# Patient Record
Sex: Male | Born: 1960 | Race: Black or African American | Hispanic: No | Marital: Single | Smoking: Former smoker
Health system: Southern US, Community
[De-identification: ages and names within clinical notes are randomized; demographics above are authoritative.]

## PROBLEM LIST (undated history)

## (undated) DIAGNOSIS — R413 Other amnesia: Secondary | ICD-10-CM

## (undated) DIAGNOSIS — N400 Enlarged prostate without lower urinary tract symptoms: Secondary | ICD-10-CM

## (undated) DIAGNOSIS — G959 Disease of spinal cord, unspecified: Secondary | ICD-10-CM

## (undated) DIAGNOSIS — I10 Essential (primary) hypertension: Secondary | ICD-10-CM

## (undated) DIAGNOSIS — G2581 Restless legs syndrome: Secondary | ICD-10-CM

## (undated) DIAGNOSIS — N529 Male erectile dysfunction, unspecified: Secondary | ICD-10-CM

## (undated) DIAGNOSIS — N401 Enlarged prostate with lower urinary tract symptoms: Secondary | ICD-10-CM

## (undated) DIAGNOSIS — Z9109 Other allergy status, other than to drugs and biological substances: Secondary | ICD-10-CM

## (undated) DIAGNOSIS — A159 Respiratory tuberculosis unspecified: Secondary | ICD-10-CM

## (undated) DIAGNOSIS — Z206 Contact with and (suspected) exposure to human immunodeficiency virus [HIV]: Secondary | ICD-10-CM

## (undated) DIAGNOSIS — J3089 Other allergic rhinitis: Secondary | ICD-10-CM

## (undated) DIAGNOSIS — R319 Hematuria, unspecified: Secondary | ICD-10-CM

## (undated) DIAGNOSIS — I2089 Other forms of angina pectoris: Secondary | ICD-10-CM

## (undated) DIAGNOSIS — N329 Bladder disorder, unspecified: Secondary | ICD-10-CM

## (undated) DIAGNOSIS — K219 Gastro-esophageal reflux disease without esophagitis: Secondary | ICD-10-CM

## (undated) DIAGNOSIS — Z87898 Personal history of other specified conditions: Secondary | ICD-10-CM

## (undated) DIAGNOSIS — J302 Other seasonal allergic rhinitis: Secondary | ICD-10-CM

## (undated) DIAGNOSIS — E785 Hyperlipidemia, unspecified: Secondary | ICD-10-CM

## (undated) DIAGNOSIS — J45909 Unspecified asthma, uncomplicated: Secondary | ICD-10-CM

## (undated) DIAGNOSIS — Z87820 Personal history of traumatic brain injury: Secondary | ICD-10-CM

## (undated) DIAGNOSIS — N138 Other obstructive and reflux uropathy: Secondary | ICD-10-CM

## (undated) HISTORY — DX: Other amnesia: R41.3

## (undated) HISTORY — DX: Contact with and (suspected) exposure to human immunodeficiency virus (hiv): Z20.6

## (undated) HISTORY — PX: BRAIN SURGERY: SHX531

## (undated) HISTORY — DX: Restless legs syndrome: G25.81

## (undated) HISTORY — DX: Unspecified asthma, uncomplicated: J45.909

## (undated) HISTORY — DX: Personal history of traumatic brain injury: Z87.820

## (undated) HISTORY — PX: CIRCUMCISION: SUR203

## (undated) HISTORY — PX: SMALL INTESTINE SURGERY: SHX150

## (undated) HISTORY — DX: Hematuria, unspecified: R31.9

## (undated) HISTORY — DX: Essential (primary) hypertension: I10

## (undated) HISTORY — PX: ABDOMINAL SURGERY: SHX537

## (undated) HISTORY — DX: Personal history of other specified conditions: Z87.898

## (undated) HISTORY — DX: Benign prostatic hyperplasia without lower urinary tract symptoms: N40.0

---
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
## 1980-08-12 HISTORY — PX: CIRCUMCISION: SUR203

## 2003-05-19 ENCOUNTER — Encounter: Admission: RE | Admit: 2003-05-19 | Discharge: 2003-05-19 | Payer: Self-pay | Admitting: Neurology

## 2003-05-19 ENCOUNTER — Encounter: Payer: Self-pay | Admitting: Neurology

## 2003-06-10 ENCOUNTER — Emergency Department (HOSPITAL_COMMUNITY): Admission: EM | Admit: 2003-06-10 | Discharge: 2003-06-10 | Payer: Self-pay | Admitting: Emergency Medicine

## 2006-11-27 ENCOUNTER — Emergency Department (HOSPITAL_COMMUNITY): Admission: EM | Admit: 2006-11-27 | Discharge: 2006-11-27 | Payer: Self-pay | Admitting: Emergency Medicine

## 2006-11-27 IMAGING — CR DG CHEST 2V
2 series · 2 of 2 positions shown · non-contrast
Comparison: none

CLINICAL DATA: Chest and left arm pain.
 CHEST - 2 VIEW:

[w chest pa]
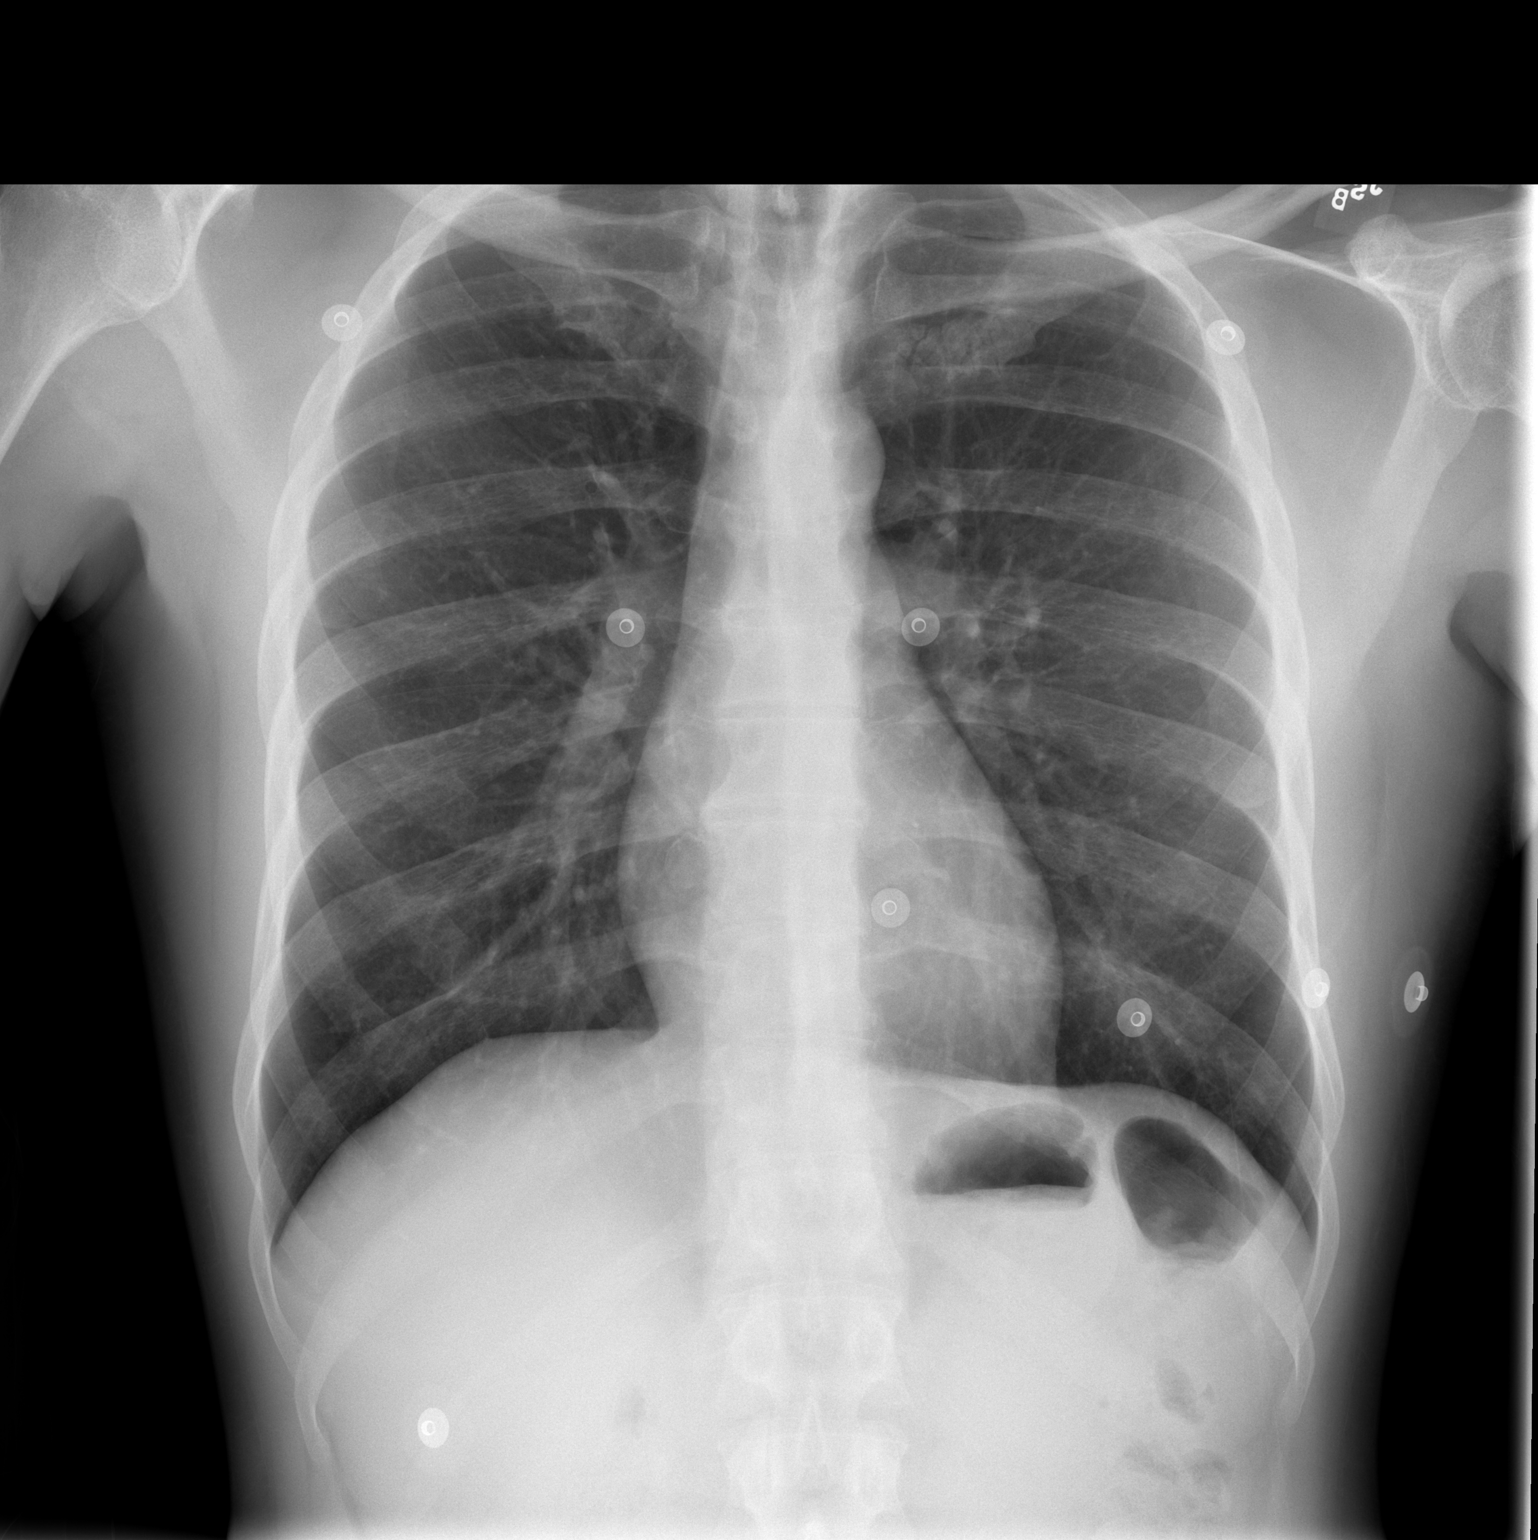

[w chest lat]
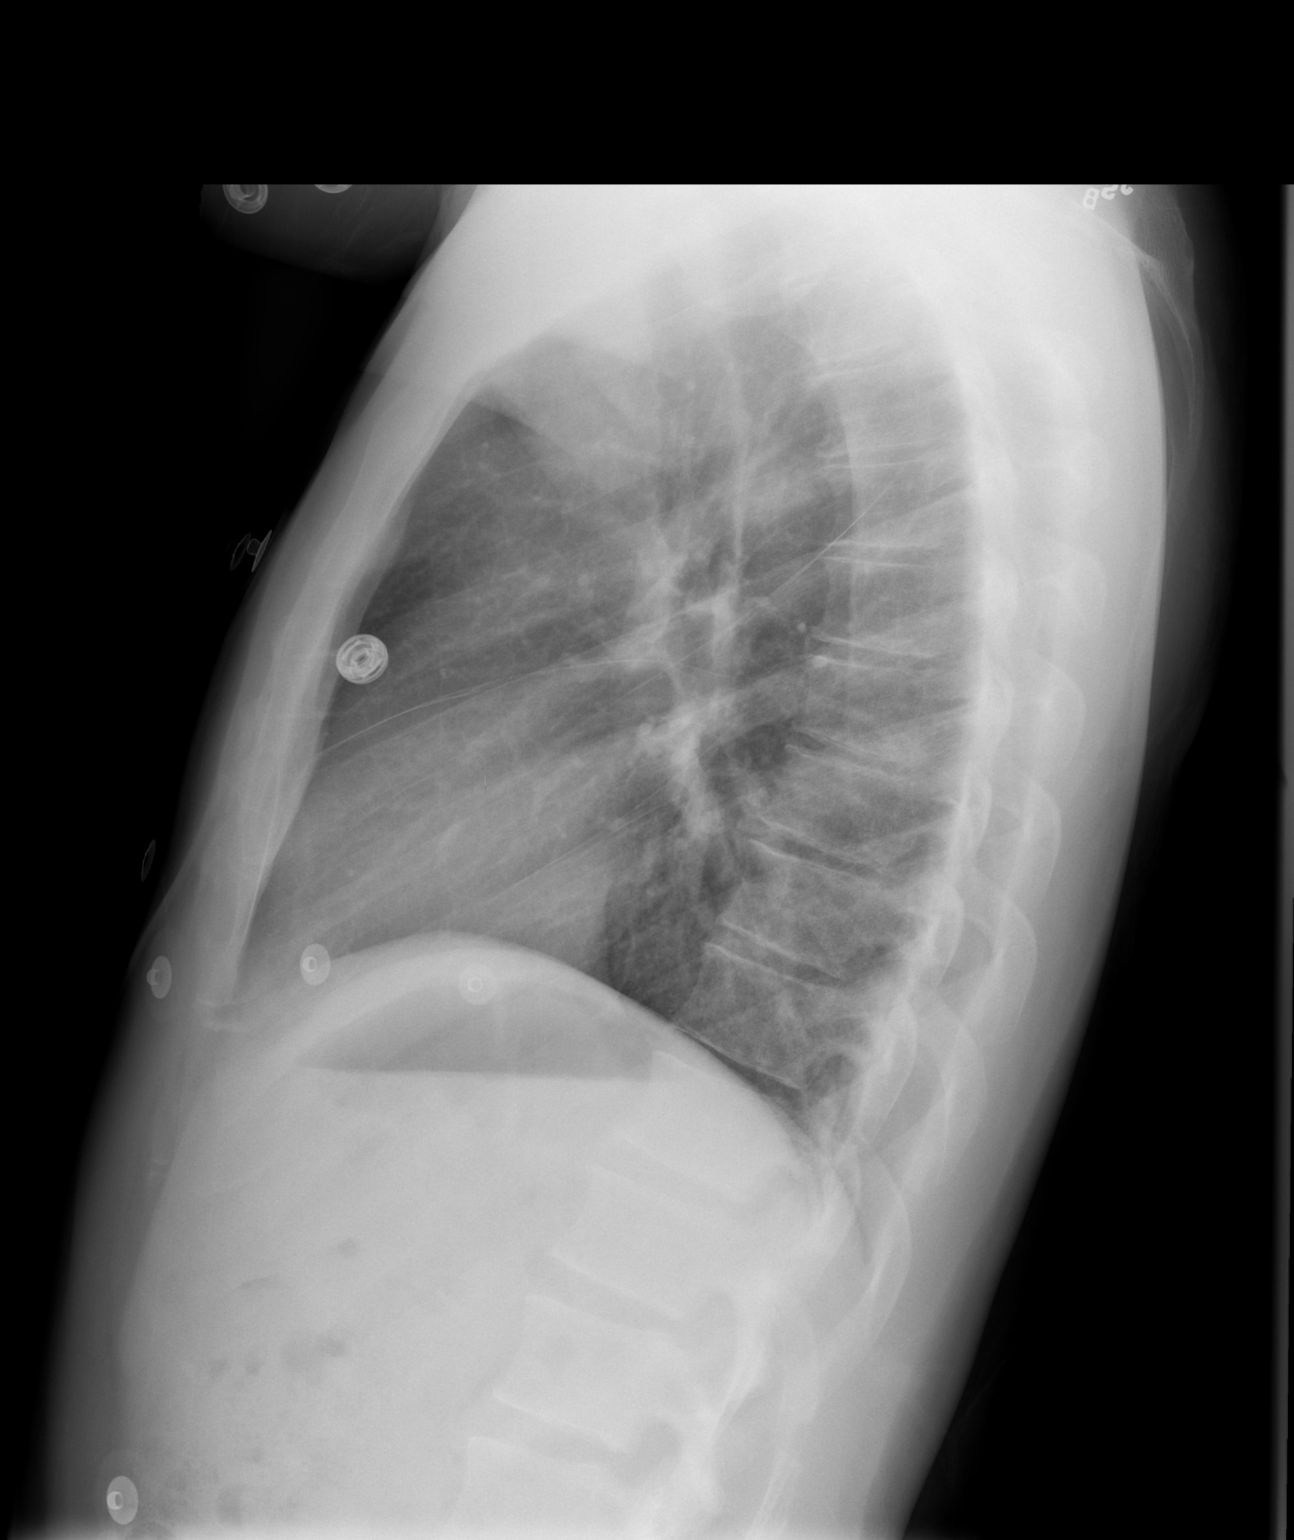

[2 of 2 positions shown; findings below may reference images not displayed]

FINDINGS: Lungs are clear.  Heart size is normal.  No effusion or focal bony abnormality.
IMPRESSION: No acute disease.

## 2008-06-13 ENCOUNTER — Ambulatory Visit: Payer: Self-pay | Admitting: Cardiovascular Disease

## 2009-04-02 ENCOUNTER — Emergency Department (HOSPITAL_COMMUNITY): Admission: EM | Admit: 2009-04-02 | Discharge: 2009-04-02 | Payer: Self-pay | Admitting: Family Medicine

## 2009-10-06 ENCOUNTER — Emergency Department (HOSPITAL_COMMUNITY): Admission: EM | Admit: 2009-10-06 | Discharge: 2009-10-06 | Payer: Self-pay | Admitting: Family Medicine

## 2010-03-06 ENCOUNTER — Emergency Department (HOSPITAL_COMMUNITY): Admission: EM | Admit: 2010-03-06 | Discharge: 2010-03-06 | Payer: Self-pay | Admitting: Family Medicine

## 2010-03-06 ENCOUNTER — Emergency Department (HOSPITAL_COMMUNITY): Admission: EM | Admit: 2010-03-06 | Discharge: 2010-03-06 | Payer: Self-pay | Admitting: Emergency Medicine

## 2010-10-15 ENCOUNTER — Inpatient Hospital Stay (INDEPENDENT_AMBULATORY_CARE_PROVIDER_SITE_OTHER)
Admission: RE | Admit: 2010-10-15 | Discharge: 2010-10-15 | Disposition: A | Discharge status: Home / Self Care | Payer: Medicare PPO | Source: Ambulatory Visit | Attending: Family Medicine | Admitting: Family Medicine

## 2010-10-15 DIAGNOSIS — M79609 Pain in unspecified limb: Secondary | ICD-10-CM

## 2010-10-27 LAB — POCT CARDIAC MARKERS
CKMB, poc: 1.1 ng/mL (ref 1.0–8.0)
CKMB, poc: <1 ng/mL — ABNORMAL LOW (ref 1.0–8.0)
Myoglobin, poc: 48.9 ng/mL (ref 12–200)
Myoglobin, poc: 52.9 ng/mL (ref 12–200)

## 2010-10-27 LAB — DIFFERENTIAL
Basophils Absolute: 0 10*3/uL (ref 0.0–0.1)
Eosinophils Absolute: 0.2 10*3/uL (ref 0.0–0.7)
Eosinophils Relative: 5 % (ref 0–5)
Lymphocytes Relative: 36 % (ref 12–46)

## 2010-10-27 LAB — BASIC METABOLIC PANEL
BUN: 11 mg/dL (ref 6–23)
CO2: 27 mEq/L (ref 19–32)
Calcium: 9.1 mg/dL (ref 8.4–10.5)
Chloride: 107 mEq/L (ref 96–112)
Creatinine, Ser: 1.12 mg/dL (ref 0.4–1.5)
GFR calc Af Amer: >60 mL/min (ref >60)
GFR calc non Af Amer: >60 mL/min (ref >60)
Glucose, Bld: 105 mg/dL — ABNORMAL HIGH (ref 70–99)
Potassium: 4.5 mEq/L (ref 3.5–5.1)
Sodium: 139 mEq/L (ref 135–145)

## 2010-10-27 LAB — HEPATIC FUNCTION PANEL
ALT: 12 U/L (ref 0–53)
Indirect Bilirubin: 0.6 mg/dL (ref 0.3–0.9)
Total Protein: 7 g/dL (ref 6.0–8.3)

## 2010-10-27 LAB — URINALYSIS, ROUTINE W REFLEX MICROSCOPIC
Bilirubin Urine: NEGATIVE
Hgb urine dipstick: NEGATIVE
Ketones, ur: NEGATIVE mg/dL
Protein, ur: NEGATIVE mg/dL
Urobilinogen, UA: 1 mg/dL (ref 0.0–1.0)

## 2010-10-27 LAB — LIPASE, BLOOD: Lipase: 40 U/L (ref 11–59)

## 2010-10-27 LAB — RAPID URINE DRUG SCREEN, HOSP PERFORMED
Amphetamines: NOT DETECTED
Tetrahydrocannabinol: NOT DETECTED

## 2010-10-27 LAB — CBC
MCV: 94.8 fL (ref 78.0–100.0)
Platelets: 213 10*3/uL (ref 150–400)
RDW: 13.5 % (ref 11.5–15.5)
WBC: 3.6 10*3/uL — ABNORMAL LOW (ref 4.0–10.5)

## 2010-10-31 LAB — DIFFERENTIAL
Basophils Absolute: 0 10*3/uL (ref 0.0–0.1)
Basophils Relative: 1 % (ref 0–1)
Eosinophils Absolute: 0.2 10*3/uL (ref 0.0–0.7)
Monocytes Absolute: 0.6 10*3/uL (ref 0.1–1.0)
Neutro Abs: 2.5 10*3/uL (ref 1.7–7.7)
Neutrophils Relative %: 44 % (ref 43–77)

## 2010-10-31 LAB — CBC
MCHC: 34 g/dL (ref 30.0–36.0)
MCV: 93.7 fL (ref 78.0–100.0)
RDW: 12.9 % (ref 11.5–15.5)

## 2010-10-31 LAB — MALARIA SMEAR
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
## 2011-07-24 ENCOUNTER — Encounter: Payer: Self-pay | Admitting: *Deleted

## 2011-07-24 ENCOUNTER — Emergency Department (INDEPENDENT_AMBULATORY_CARE_PROVIDER_SITE_OTHER)
Admission: EM | Admit: 2011-07-24 | Discharge: 2011-07-24 | Disposition: A | Discharge status: Home / Self Care | Payer: Medicare PPO | Source: Home / Self Care | Attending: Family Medicine | Admitting: Family Medicine

## 2011-07-24 DIAGNOSIS — J111 Influenza due to unidentified influenza virus with other respiratory manifestations: Secondary | ICD-10-CM

## 2011-07-24 DIAGNOSIS — R6889 Other general symptoms and signs: Secondary | ICD-10-CM

## 2011-07-24 HISTORY — DX: Bladder disorder, unspecified: N32.9

## 2011-07-24 HISTORY — DX: Other allergy status, other than to drugs and biological substances: Z91.09

## 2011-07-24 NOTE — ED Notes (Signed)
Pt  Reports     Fever  Body  Aches     And  Cough  With  Some dizzyness        Symptoms  X  4  Days            Somewhat  Dry  Cough

## 2011-07-24 NOTE — ED Provider Notes (Signed)
Travis Bryant is a 50 year old male who has had 3 days of sore throat, cough, congestion, fever, body aches. He tried NyQuil and DayQuil and some prescription cough medicine that was left over. It'll help a little bit but not very much. He denies any dyspnea he notes that he did not get a flu shot this year. He has positive sick contacts at work. He is eating and drinking okay but overall thinks he has the flu.  PMH reviewed.  ROS as above otherwise neg Medications reviewed. No current facility-administered medications for this encounter.   Current Outpatient Prescriptions  Medication Sig Dispense Refill  . solifenacin (VESICARE) 5 MG tablet Take 10 mg by mouth daily.        . Tamsulosin HCl (FLOMAX) 0.4 MG CAPS Take by mouth.          Exam:  BP 138/81  Pulse 93  Temp(Src) 98.5 F (36.9 C) (Oral)  Resp 20  SpO2 100% Gen: Well NAD HEENT: EOMI,  MMM, posterior pharyngeal erythema Lungs: CTABL Nl WOB Heart: RRR no MRG Abd: NABS, NT, ND Exts: Non edematous BL  LE, warm and well perfused.    Assessment and plan: 50 year old male with influenza-like illness. Plan Tylenol and ibuprofen for symptomatic management. Reviewed red flag signs or symptoms including dyspnea and vomiting with patient who expresses understanding handout and work note given.  Travis Bryant 07/24/11 1843

## 2011-11-09 ENCOUNTER — Emergency Department (HOSPITAL_COMMUNITY)
Admission: EM | Admit: 2011-11-09 | Discharge: 2011-11-09 | Disposition: A | Discharge status: Home / Self Care | Payer: Medicare PPO | Source: Home / Self Care | Attending: Emergency Medicine | Admitting: Emergency Medicine

## 2011-11-09 ENCOUNTER — Encounter (HOSPITAL_COMMUNITY): Payer: Self-pay | Admitting: *Deleted

## 2011-11-09 DIAGNOSIS — M5412 Radiculopathy, cervical region: Secondary | ICD-10-CM

## 2011-11-09 MED ORDER — MELOXICAM 7.5 MG PO TABS: 7.5000 mg | ORAL_TABLET | Freq: Every day | ORAL | Status: AC

## 2011-11-09 NOTE — Discharge Instructions (Signed)
Cervical Radiculopathy  Suspect most likely your current symptoms related to an irritated nerve take this medicine for 2 weeks if pain persists followup with her primary care Dr. Kathie Rhodes. discuss this further imaging will be necessary. We also discussed what symptoms should require for you to go to the emergency department for emergent imaging.    Cervical radiculopathy happens when a nerve in the neck is pinched or bruised by a slipped (herniated) disk or by arthritic changes in the bones of the cervical spine. This can occur due to an injury or as part of the normal aging process. Pressure on the cervical nerves can cause pain or numbness that runs from your neck all the way down into your arm and fingers. CAUSES  There are many possible causes, including:  Injury.   Muscle tightness in the neck from overuse.   Swollen, painful joints (arthritis).   Breakdown or degeneration in the bones and joints of the spine (spondylosis) due to aging.   Bone spurs that may develop near the cervical nerves.  SYMPTOMS  Symptoms include pain, weakness, or numbness in the affected arm and hand. Pain can be severe or irritating. Symptoms may be worse when extending or turning the neck. DIAGNOSIS  Your caregiver will ask about your symptoms and do a physical exam. He or she may test your strength and reflexes. X-rays, CT scans, and MRI scans may be needed in cases of injury or if the symptoms do not go away after a period of time. Electromyography (EMG) or nerve conduction testing may be done to study how your nerves and muscles are working. TREATMENT  Your caregiver may recommend certain exercises to help relieve your symptoms. Cervical radiculopathy can, and often does, get better with time and treatment. If your problems continue, treatment options may include:  Wearing a soft collar for short periods of time.   Physical therapy to strengthen the neck muscles.   Medicines, such as nonsteroidal  anti-inflammatory drugs (NSAIDs), oral corticosteroids, or spinal injections.   Surgery. Different types of surgery may be done depending on the cause of your problems.  HOME CARE INSTRUCTIONS   Put ice on the affected area.   Put ice in a plastic bag.   Place a towel between your skin and the bag.   Leave the ice on for 15 to 20 minutes, 3 to 4 times a day or as directed by your caregiver.   Use a flat pillow when you sleep.   Only take over-the-counter or prescription medicines for pain, discomfort, or fever as directed by your caregiver.   If physical therapy was prescribed, follow your caregiver's directions.   If a soft collar was prescribed, use it as directed.  SEEK IMMEDIATE MEDICAL CARE IF:   Your pain gets much worse and cannot be controlled with medicines.   You have weakness or numbness in your hand, arm, face, or leg.   You have a high fever or a stiff, rigid neck.   You lose bowel or bladder control (incontinence).   You have trouble with walking, balance, or speaking.  MAKE SURE YOU:   Understand these instructions.   Will watch your condition.   Will get help right away if you are not doing well or get worse.  Document Released: 04/23/2001 Document Revised: 07/18/2011 Document Reviewed: 03/12/2011 [facility] Patient Information 2012 [place], [place].
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
## 2011-11-09 NOTE — ED Notes (Signed)
Pt with c/o pain right side of neck radiates down back and arm onset x 2 weeks - no known injury -  Pain increases with movement

## 2011-12-07 ENCOUNTER — Emergency Department (HOSPITAL_COMMUNITY)
Admission: EM | Admit: 2011-12-07 | Discharge: 2011-12-07 | Disposition: A | Discharge status: Home / Self Care | Payer: Medicare PPO | Source: Home / Self Care | Attending: Family Medicine | Admitting: Family Medicine

## 2011-12-07 ENCOUNTER — Encounter (HOSPITAL_COMMUNITY): Payer: Self-pay

## 2011-12-07 DIAGNOSIS — J309 Allergic rhinitis, unspecified: Secondary | ICD-10-CM

## 2011-12-07 DIAGNOSIS — J302 Other seasonal allergic rhinitis: Secondary | ICD-10-CM

## 2011-12-07 MED ORDER — METHYLPREDNISOLONE ACETATE 40 MG/ML IJ SUSP
80.0000 mg | Freq: Once | INTRAMUSCULAR | Status: AC
  Administered 2011-12-07: 80 mg via INTRAMUSCULAR

## 2011-12-07 MED ORDER — TRIAMCINOLONE ACETONIDE 40 MG/ML IJ SUSP
INTRAMUSCULAR | Status: AC
  Filled 2011-12-07: qty 5

## 2011-12-07 MED ORDER — FLUTICASONE PROPIONATE 50 MCG/ACT NA SUSP: 1.0000 | Freq: Two times a day (BID) | NASAL | Status: DC

## 2011-12-07 MED ORDER — CETIRIZINE HCL 10 MG PO TABS: 10.0000 mg | ORAL_TABLET | Freq: Every day | ORAL | Status: DC

## 2011-12-07 MED ORDER — METHYLPREDNISOLONE ACETATE 80 MG/ML IJ SUSP
INTRAMUSCULAR | Status: AC
  Filled 2011-12-07: qty 1

## 2011-12-07 MED ORDER — TRIAMCINOLONE ACETONIDE 40 MG/ML IJ SUSP
40.0000 mg | Freq: Once | INTRAMUSCULAR | Status: AC
  Administered 2011-12-07: 40 mg via INTRAMUSCULAR

## 2011-12-07 NOTE — ED Notes (Signed)
Pt has headache, congestion and cough with sneezing that started yesterday.

## 2011-12-07 NOTE — ED Provider Notes (Signed)
History     CSN: [id number]  Arrival date & time 12/07/11  1446   First MD Initiated Contact with Patient 12/07/11 1457      Chief Complaint  Patient presents with  . URI    (Consider location/radiation/quality/duration/timing/severity/associated sxs/prior treatment) Patient is a 51 y.o. male presenting with URI. The history is provided by the patient.  URI The primary symptoms include rash. Primary symptoms do not include fever, wheezing, nausea or vomiting. The current episode started 2 days ago. This is a new problem. The problem has been gradually worsening.  Symptoms associated with the illness include congestion and rhinorrhea.    Past Medical History  Diagnosis Date  . Bladder disease   . Environmental allergies     Past Surgical History  Procedure Date  . Abdominal surgery   . Brain surgery     Family History  Problem Relation Age of Onset  . Lupus Mother   . Hypertension Father     History  Substance Use Topics  . Smoking status: Never Smoker   . Smokeless tobacco: Not on file  . Alcohol Use: Yes     socially      Review of Systems  Constitutional: Negative for fever.  HENT: Positive for congestion, rhinorrhea, sneezing and postnasal drip.   Respiratory: Negative for wheezing.   Gastrointestinal: Negative.  Negative for nausea and vomiting.  Skin: Positive for rash.    Allergies  Review of patient's allergies indicates no known allergies.  Home Medications   Current Outpatient Rx  Name Route Sig Dispense Refill  . ACETAMINOPHEN 325 MG PO TABS Oral Take 650 mg by mouth every 6 (six) hours as needed.    Marland Kitchen CETIRIZINE HCL 10 MG PO TABS Oral Take 1 tablet (10 mg total) by mouth daily. One tab daily for allergies 30 tablet 1  . FLUTICASONE PROPIONATE 50 MCG/ACT NA SUSP Nasal Place 1 spray into the nose 2 (two) times daily. 1 g 2  . MELOXICAM 7.5 MG PO TABS Oral Take 1 tablet (7.5 mg total) by mouth daily. 14 tablet 0  . SOLIFENACIN SUCCINATE 5 MG  PO TABS Oral Take 10 mg by mouth daily.      Marland Kitchen TAMSULOSIN HCL 0.4 MG PO CAPS Oral Take by mouth.        BP 147/84  Pulse 103  Temp(Src) 99.1 F (37.3 C) (Oral)  Resp 19  SpO2 96%  Physical Exam  Nursing note and vitals reviewed. Constitutional: He is oriented to person, place, and time. He appears well-developed and well-nourished.  HENT:  Head: Normocephalic.  Right Ear: External ear normal.  Left Ear: External ear normal.  Nose: Mucosal edema and rhinorrhea present.  Mouth/Throat: Oropharynx is clear and moist.  Neck: Normal range of motion. Neck supple.  Pulmonary/Chest: Breath sounds normal.  Lymphadenopathy:    He has no cervical adenopathy.  Neurological: He is alert and oriented to person, place, and time.  Skin: Skin is warm and dry.  Psychiatric: He has a normal mood and affect.    ED Course  Procedures (including critical care time)  Labs Reviewed - No data to display No results found.   1. Seasonal allergic rhinitis       MDM          Linna Hoff, MD 12/07/11 1606

## 2011-12-12 DIAGNOSIS — G43909 Migraine, unspecified, not intractable, without status migrainosus: Secondary | ICD-10-CM | POA: Insufficient documentation

## 2011-12-12 DIAGNOSIS — S0990XA Unspecified injury of head, initial encounter: Secondary | ICD-10-CM | POA: Insufficient documentation

## 2012-07-22 ENCOUNTER — Ambulatory Visit
Admission: RE | Admit: 2012-07-22 | Discharge: 2012-07-22 | Disposition: A | Discharge status: Home / Self Care | Payer: Self-pay | Source: Ambulatory Visit | Attending: Infectious Diseases | Admitting: Infectious Diseases

## 2012-07-22 ENCOUNTER — Other Ambulatory Visit: Payer: Self-pay | Admitting: Infectious Diseases

## 2012-07-22 DIAGNOSIS — R7611 Nonspecific reaction to tuberculin skin test without active tuberculosis: Secondary | ICD-10-CM

## 2013-01-22 ENCOUNTER — Telehealth: Payer: Self-pay | Admitting: Neurology
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
## 2013-01-22 NOTE — Telephone Encounter (Signed)
Patient stated that his memory and comprehension have become much worse and that he has lost several jobs since his last encounter with Darrol Angel (12-12-2011).  His headaches are still a constant issue however he wants to be seen because of his failing memory and comprehension which has (lately) been commented on by several people.

## 2013-01-22 NOTE — Telephone Encounter (Signed)
I called the patient. The patient indicates a greater than one-year history of some problems with cognitive processing. The patient also reports some fatigue issues. The patient apparently has lost his job because of his cognitive issues. We will try to get the patient in the office for an evaluation, he may require EEG evaluation, and scanning procedures as well as some blood work. The patient may need a sleep study.

## 2013-01-25 ENCOUNTER — Ambulatory Visit (INDEPENDENT_AMBULATORY_CARE_PROVIDER_SITE_OTHER): Payer: BC Managed Care – PPO | Admitting: Neurology

## 2013-01-25 ENCOUNTER — Encounter: Payer: Self-pay | Admitting: Neurology

## 2013-01-25 VITALS — BP 143/84 | HR 81 | Ht 69.5 in | Wt 150.0 lb

## 2013-01-25 DIAGNOSIS — S0990XA Unspecified injury of head, initial encounter: Secondary | ICD-10-CM

## 2013-01-25 DIAGNOSIS — G43909 Migraine, unspecified, not intractable, without status migrainosus: Secondary | ICD-10-CM

## 2013-01-25 DIAGNOSIS — R413 Other amnesia: Secondary | ICD-10-CM | POA: Insufficient documentation

## 2013-01-25 DIAGNOSIS — R51 Headache: Secondary | ICD-10-CM

## 2013-01-25 HISTORY — DX: Other amnesia: R41.3

## 2013-01-25 MED ORDER — ZOLMITRIPTAN 5 MG PO TABS: 5.0000 mg | ORAL_TABLET | Freq: Two times a day (BID) | ORAL | Status: DC | PRN

## 2013-01-25 MED ORDER — NORTRIPTYLINE HCL 10 MG PO CAPS: ORAL_CAPSULE | ORAL | Status: DC

## 2013-01-25 NOTE — Progress Notes (Signed)
Reason for visit: Memory disturbance  Travis Bryant is an 52 y.o. male  History of present illness:  Travis Bryant is a 52 year old left-handed black male with a history of a closed head injury many years ago, and associated left sided headaches. 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 The patient returns to the office as he has indicated that there has been a gradual change in his memory that has occurred, and he has lost his job because of this. The patient indicates that his headaches are becoming more frequent, again over the left frontotemporal region. The headaches are throbbing in nature, and are daily. The patient feels tired after the headaches. The patient is not sleeping well, and he has chronic insomnia. The patient has noted a parallel between the fatigue and the memory issues. The patient denies any new numbness or weakness of the face, arms, or legs. The patient returns for an evaluation.  Past Medical History  Diagnosis Date  . Bladder disease   . Environmental allergies   . History of headache   . Asthma   . Benign enlargement of prostate   . 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 Memory change 01/25/2013    Past Surgical History  Procedure Laterality Date  . Abdominal surgery    . Brain surgery      Family History  Problem Relation Age of Onset  . Lupus Mother   . Arthritis/Rheumatoid Mother   . Hypertension Father   . Heart disease Father   . Sarcoidosis Sister   . Cancer Maternal Grandmother   . Heart disease Maternal Grandfather   . Cancer - Prostate Paternal Grandfather     Social history:  reports that he has never smoked. He does not have any smokeless tobacco history on file. He reports that  drinks alcohol. He reports that he does not use illicit drugs.  Allergies: No Known Allergies  Medications:  Current Outpatient Prescriptions on File Prior to Visit   Medication Sig Dispense Refill  . acetaminophen (TYLENOL) 325 MG tablet Take 650 mg by mouth every 6 (six) hours as needed.      . solifenacin (VESICARE) 5 MG tablet Take 10 mg by mouth daily.        . Tamsulosin HCl (FLOMAX) 0.4 MG CAPS Take by mouth.        . cetirizine (ZYRTEC) 10 MG tablet Take 1 tablet (10 mg total) by mouth daily. One tab daily for allergies  30 tablet  1  . fluticasone (FLONASE) 50 MCG/ACT nasal spray Place 1 spray into the nose 2 (two) times daily.  1 g  2   No current facility-administered medications on file prior to visit.    ROS:  Out of a complete 14 system review of symptoms, the patient complains only of the following symptoms, and all other reviewed systems are negative.  Fatigue Blurred vision Difficulty urinating Allergies Confusion, headache, weakness, slurred speech Dizziness Depression, not enough sleep, decreased energy, racing thoughts Excessive daytime drowsiness, restless legs  Blood pressure 143/84, pulse 81, height 5' 9.5" (1.765 m), weight 150 lb (68.04 kg).  Physical Exam  General: The patient is alert and cooperative at the time of the examination.  Skin: No significant peripheral edema is noted.   Neurologic Exam  Mental status: Mini-Mental status examination done today shows a total score 28/30. The patient is able to name 5 animals  in 60 seconds.  Cranial nerves: Facial symmetry is present. Speech is normal, no aphasia or dysarthria is noted. Extraocular movements are full. Visual fields are full.  Motor: The patient has good strength in all 4 extremities.  Coordination: The patient has good finger-nose-finger and heel-to-shin bilaterally, but there is slight slowness of movement with the right arm with finger-nose-finger as compared to the left.  Gait and station: The patient has a normal gait. Tandem gait is slightly unsteady. Romberg is negative. No drift is seen.  Reflexes: Deep tendon reflexes are notable for  increased reflexes on the right arm and right leg relative to the left.   Assessment/Plan:  1. Memory disturbance  2. Closed head injury  3. History of headache  The patient has noted a change in his underlying cognitive issues. The patient will be set up for MRI evaluation of the brain, and blood work today. He will be started on nortriptyline taking this at night to help him sleep, and to help the headache. In the future, a sleep study may be done, as the fatigue, excessive daytime drowsiness, and memory issues have paralleled one another.  Marlan Palau MD 01/25/2013 7:07 PM  [facility] [address] [place], [place] [address]  Phone [phone] Fax [phone]

## 2013-02-01 ENCOUNTER — Other Ambulatory Visit: Payer: Self-pay | Admitting: Neurology

## 2013-02-01 DIAGNOSIS — R413 Other amnesia: Secondary | ICD-10-CM

## 2013-02-01 DIAGNOSIS — Z139 Encounter for screening, unspecified: Secondary | ICD-10-CM

## 2013-02-01 DIAGNOSIS — R51 Headache: Secondary | ICD-10-CM

## 2013-02-08 ENCOUNTER — Ambulatory Visit
Admission: RE | Admit: 2013-02-08 | Discharge: 2013-02-08 | Disposition: A | Discharge status: Home / Self Care | Payer: Medicare PPO | Source: Ambulatory Visit | Attending: Neurology | Admitting: Neurology

## 2013-02-08 DIAGNOSIS — R413 Other amnesia: Secondary | ICD-10-CM

## 2013-02-08 DIAGNOSIS — R51 Headache: Secondary | ICD-10-CM

## 2013-02-08 DIAGNOSIS — Z139 Encounter for screening, unspecified: Secondary | ICD-10-CM

## 2013-02-09 ENCOUNTER — Telehealth: Payer: Self-pay | Admitting: Neurology

## 2013-02-09 DIAGNOSIS — G479 Sleep disorder, unspecified: Secondary | ICD-10-CM

## 2013-02-09 NOTE — Telephone Encounter (Signed)
I called patient. 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 The patient has a history of traumatic brain injury. The patient has lost his job because of his inability to concentrate. The patient reports excessive daytime drowsiness, and we will check a sleep study at this time.

## 2013-02-25 ENCOUNTER — Telehealth: Payer: Self-pay | Admitting: Neurology

## 2013-02-25 NOTE — Telephone Encounter (Signed)
When I spoke with him this morning I offered the consultation first, but he is currently unemployed and does not feel that he can afford another consultation visit until the end of next month.  I did tell him that I would see if I could get the attended study approved through his insurance first.  Please advise if you would like the patient to hold off until he can make the consultation appointment first?

## 2013-02-25 NOTE — Telephone Encounter (Signed)
Please explain to patient that I would like to make him first to tease out his sleep related complaints before we proceed with a sleep study. This is because he also endorses RLS and sleep paralysis. thx s

## 2013-02-25 NOTE — Progress Notes (Signed)
I would like to go ahead and talk to the patient before we proceed with a sleep study as he has endorsed some sleep paralysis and RLS and I would like to tease out his sleep related issues before we proceed with a sleep study.

## 2013-02-25 NOTE — Telephone Encounter (Signed)
. °  Dr. Marlan Palau is referring Travis Bryant, 52 y.o. y/o male, for the evaluation of sleep apnea.  Wt: 150 lbs Ht: 69.5 in. BMI: 21.84  Diagnoses: EDS Insomnia Asthma Headache Head Injury Fatigue Restless Legs  Medication List: Current Outpatient Prescriptions  Medication Sig Dispense Refill   acetaminophen (TYLENOL) 325 MG tablet Take 650 mg by mouth every 6 (six) hours as needed.       albuterol (PROVENTIL HFA;VENTOLIN HFA) 108 (90 BASE) MCG/ACT inhaler Inhale 2 puffs into the lungs every 6 (six) hours as needed for wheezing.       cetirizine (ZYRTEC) 10 MG tablet Take 1 tablet (10 mg total) by mouth daily. One tab daily for allergies  30 tablet  1   fluticasone (FLONASE) 50 MCG/ACT nasal spray Place 1 spray into the nose 2 (two) times daily.  1 g  2   nortriptyline (PAMELOR) 10 MG capsule One tablet at night for one week, then take two tablets at night  60 capsule  3   solifenacin (VESICARE) 5 MG tablet Take 10 mg by mouth daily.         Tamsulosin HCl (FLOMAX) 0.4 MG CAPS Take 0.4 mg by mouth daily.        zolmitriptan (ZOMIG) 5 MG tablet Take 1 tablet (5 mg total) by mouth 2 (two) times daily as needed for migraine.  10 tablet  5   No current facility-administered medications for this visit.    This patient presents to [name] with a complaint of difficulty with concentration, headache, and excessive daytime sleepiness.  The patient endorse ESS at 11.  He reports snoring and witnessed apneic events.  He has awakened choking, gasping, and has also experienced instances of sleep paralysis.  [name] would like the patient to proceed with an attended sleep study to rule out sleep apnea.  Insurance:  [employer] - Sleep study has been approved by insurance.

## 2013-03-04 ENCOUNTER — Ambulatory Visit (INDEPENDENT_AMBULATORY_CARE_PROVIDER_SITE_OTHER): Payer: BC Managed Care – PPO | Admitting: Neurology

## 2013-03-04 ENCOUNTER — Other Ambulatory Visit: Payer: Self-pay | Admitting: Neurology

## 2013-03-04 DIAGNOSIS — G4733 Obstructive sleep apnea (adult) (pediatric): Secondary | ICD-10-CM

## 2013-03-04 DIAGNOSIS — IMO0002 Reserved for concepts with insufficient information to code with codable children: Secondary | ICD-10-CM

## 2013-03-04 DIAGNOSIS — G4761 Periodic limb movement disorder: Secondary | ICD-10-CM

## 2013-03-18 ENCOUNTER — Telehealth: Payer: Self-pay | Admitting: *Deleted

## 2013-03-18 NOTE — Telephone Encounter (Signed)
I will read his study first on Friday, thx sa

## 2013-03-18 NOTE — Telephone Encounter (Signed)
Left message for patient and let him know that study was waiting to be read and that the doctors have scheduled "read days".  I moved the study to the top of the list so it is the first study that will be read when Dr. Frances Furbish is scheduled to read studies which is Friday afternoon.  I assured him we would do all we could to let him know results Friday afternoon if possible but that I would be out of office Friday so if by chance he didn't receive a phone call then, I will be sure to contact him Sunday evening when I am in the office again to discuss the outcome of his study.  I apologized for any delay and thanked him for his patience.

## 2013-03-18 NOTE — Telephone Encounter (Signed)
Message copied by Daryll Drown on Thu Mar 18, 2013  9:06 AM ------      Message from: [facility]      Created: Thu Mar 18, 2013  8:06 AM      Regarding: Need results       Patient is calling to request the results from his sleep study.  Please return call to [phone] ------

## 2013-03-19 ENCOUNTER — Telehealth: Payer: Self-pay | Admitting: Neurology

## 2013-03-19 NOTE — Telephone Encounter (Signed)
I spoke with Mr Volkov and relayed the findings of his sleep study as outlined by Dr Frances Furbish.  Mr Makris was also reminded of his follow-up appointment with Darrol Angel.

## 2013-03-19 NOTE — Telephone Encounter (Signed)
Travis Bryant:  Please call and notify the patient that the recent sleep study did not show any significant obstructive sleep apnea. There is mild intermittent snoring and evidence of mild sleep apnea only when he sleeps on his back or when he is in dream sleep. For this degree of mild sleep apnea he is advised to try to sleep on his sides rather than his back. He can go over details when he sees [name] back in followup. He has periodic leg movements in his sleep which means repetitive like taking. This does not result in a whole lot of sleep disruption however. If he has symptoms of restless leg syndrome such as an urge to move his legs and abnormal sensations in his legs he can also bring this up with [name] at the time of his appointment. I believe he should be getting a copy of his sleep study report in the mail as well.  Thanks,  [name], MD, PhD [facility])

## 2013-03-25 ENCOUNTER — Other Ambulatory Visit: Payer: Self-pay | Admitting: *Deleted

## 2013-03-25 DIAGNOSIS — G4733 Obstructive sleep apnea (adult) (pediatric): Secondary | ICD-10-CM

## 2013-03-26 ENCOUNTER — Telehealth: Payer: Self-pay | Admitting: Neurology

## 2013-03-26 NOTE — Telephone Encounter (Signed)
I called the patient. The patient has had some irritability. The patient recently started nortriptyline, and this could be the etiology of these behavior changes. The patient is to go off of nortriptyline, and it is still having problems over the next 2 weeks, he is to contact our office.

## 2013-05-21 ENCOUNTER — Telehealth: Payer: Self-pay | Admitting: Neurology

## 2013-05-21 DIAGNOSIS — F329 Major depressive disorder, single episode, unspecified: Secondary | ICD-10-CM

## 2013-05-21 NOTE — Telephone Encounter (Signed)
Patient came into office late this afternoon, wanting to talk to someone right away, making threatening remarks to front desk staff.  Sandy and I went to talk to him in lobby.  It seems he just had a verbal fight about some tires and he was really upset with how he is reacting.  He said he has been lashing out at people for about 3-4 months now and it is upsetting him because it not like him.  He also said he has memory lapses.  The patient is under a lot of stress with going to school and working.  We talked about getting psychiatric help if needed and also if he felt really bad to go to [facility].  He broke down while talking with [place].  I told him we would get him in to see [name] soon so he can discuss these issues and get a referral if that is what the doctor recommends.  He was in agreement and left much calmer.  Please advise.  [phone]

## 2013-05-22 MED ORDER — DIVALPROEX SODIUM 250 MG PO DR TAB: 250.0000 mg | DELAYED_RELEASE_TABLET | Freq: Two times a day (BID) | ORAL | Status: DC

## 2013-05-22 NOTE — Telephone Encounter (Signed)
I called the patient. He is having anger issues. He is OK with a psychiatric referral. I will try to get this set up. I will place him on low-dose depakote at this time.

## 2013-07-16 ENCOUNTER — Ambulatory Visit (INDEPENDENT_AMBULATORY_CARE_PROVIDER_SITE_OTHER): Payer: Medicare PPO | Admitting: Neurology

## 2013-07-16 ENCOUNTER — Encounter (INDEPENDENT_AMBULATORY_CARE_PROVIDER_SITE_OTHER): Payer: Self-pay

## 2013-07-16 ENCOUNTER — Encounter: Payer: Self-pay | Admitting: Neurology

## 2013-07-16 VITALS — BP 140/85 | HR 78 | Wt 155.0 lb

## 2013-07-16 DIAGNOSIS — G43909 Migraine, unspecified, not intractable, without status migrainosus: Secondary | ICD-10-CM

## 2013-07-16 DIAGNOSIS — R413 Other amnesia: Secondary | ICD-10-CM

## 2013-07-16 MED ORDER — DIVALPROEX SODIUM ER 500 MG PO TB24: 500.0000 mg | ORAL_TABLET | Freq: Two times a day (BID) | ORAL | Status: DC

## 2013-07-16 NOTE — Patient Instructions (Signed)
Migraine Headache A migraine headache is an intense, throbbing pain on one or both sides of your head. A migraine can last for 30 minutes to several hours. CAUSES  The exact cause of a migraine headache is not always known. However, a migraine may be caused when nerves in the brain become irritated and release chemicals that cause inflammation. This causes pain. SYMPTOMS  Pain on one or both sides of your head.  Pulsating or throbbing pain.  Severe pain that prevents daily activities.  Pain that is aggravated by any physical activity.  Nausea, vomiting, or both.  Dizziness.  Pain with exposure to bright lights, loud noises, or activity.  General sensitivity to bright lights, loud noises, or smells. Before you get a migraine, you may get warning signs that a migraine is coming (aura). An aura may include:  Seeing flashing lights.  Seeing bright spots, halos, or zig-zag lines.  Having tunnel vision or blurred vision.  Having feelings of numbness or tingling.  Having trouble talking.  Having muscle weakness. MIGRAINE TRIGGERS  Alcohol.  Smoking.  Stress.  Menstruation.  Aged cheeses.  Foods or drinks that contain nitrates, glutamate, aspartame, or tyramine.  Lack of sleep.  Chocolate.  Caffeine.  Hunger.  Physical exertion.  Fatigue.  Medicines used to treat chest pain (nitroglycerine), birth control pills, estrogen, and some blood pressure medicines. DIAGNOSIS  A migraine headache is often diagnosed based on:  Symptoms.  Physical examination.  A CT scan or MRI of your head. TREATMENT Medicines may be given for pain and nausea. Medicines can also be given to help prevent recurrent migraines.  HOME CARE INSTRUCTIONS  Only take over-the-counter or prescription medicines for pain or discomfort as directed by your caregiver. The use of long-term narcotics is not recommended.  Lie down in a dark, quiet room when you have a migraine.  Keep a journal  to find out what may trigger your migraine headaches. For example, write down:  What you eat and drink.  How much sleep you get.  Any change to your diet or medicines.  Limit alcohol consumption.  Quit smoking if you smoke.  Get 7 to 9 hours of sleep, or as recommended by your caregiver.  Limit stress.  Keep lights dim if bright lights bother you and make your migraines worse. SEEK IMMEDIATE MEDICAL CARE IF:   Your migraine becomes severe.  You have a fever.  You have a stiff neck.  You have vision loss.  You have muscular weakness or loss of muscle control.  You start losing your balance or have trouble walking.  You feel faint or pass out.  You have severe symptoms that are different from your first symptoms. MAKE SURE YOU:   Understand these instructions.  Will watch your condition.  Will get help right away if you are not doing well or get worse. Document Released: 07/29/2005 Document Revised: 10/21/2011 Document Reviewed: 07/19/2011 Exit[employer] Patient Information 2014 [employer].  

## 2013-07-16 NOTE — Progress Notes (Signed)
Reason for visit: Headache  Travis Bryant is an 52 y.o. male  History of present illness:  Travis Bryant is a 52 year old right-handed black male with a history of headaches. The patient has a chronic mild right hemiparesis. The patient recently has had some problems with irritability, difficulty concentrating, agitation, depression, and fatigue. The patient is upset easily. The patient is working part-time as an Secondary school teacher, and he at times has difficulty emotionally handling his job. The patient was placed on low-dose Depakote, and he was referred to a psychiatrist, but this appointment never took place. The patient is on 250 mg of Depakote twice daily, and he is tolerating this, but he is not sure that this is helpful for him. The patient returns to the office today for an evaluation. No other new medical issues have come up since last seen. The patient is having some difficulty with headaches off and on. The patient may take Zomig for the headaches. The patient is not sleeping well at night, in part because he has to get up frequently to use the bathroom secondary to urinary frequency.  Past Medical History  Diagnosis Date  . Bladder disease   . Environmental allergies   . History of headache   . Asthma   . Benign enlargement of prostate   . 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 Memory change 01/25/2013    Past Surgical History  Procedure Laterality Date  . Abdominal surgery    . Brain surgery      Family History  Problem Relation Age of Onset  . Lupus Mother   . Arthritis/Rheumatoid Mother   . Hypertension Father   . Heart disease Father   . Sarcoidosis Sister   . Cancer Maternal Grandmother   . Heart disease Maternal Grandfather   . Cancer - Prostate Paternal Grandfather     Social history:  reports that he has never smoked. He does not have any smokeless tobacco history on file. He reports that he drinks alcohol. He reports that he does not use  illicit drugs.   No Known Allergies  Medications:  Current Outpatient Prescriptions on File Prior to Visit  Medication Sig Dispense Refill  . acetaminophen (TYLENOL) 325 MG tablet Take 650 mg by mouth every 6 (six) hours as needed.      Marland Kitchen albuterol (PROVENTIL HFA;VENTOLIN HFA) 108 (90 BASE) MCG/ACT inhaler Inhale 2 puffs into the lungs every 6 (six) hours as needed for wheezing.      . solifenacin (VESICARE) 5 MG tablet Take 10 mg by mouth daily.        . Tamsulosin HCl (FLOMAX) 0.4 MG CAPS Take 0.4 mg by mouth daily.       Marland Kitchen zolmitriptan (ZOMIG) 5 MG tablet Take 1 tablet (5 mg total) by mouth 2 (two) times daily as needed for migraine.  10 tablet  5  . cetirizine (ZYRTEC) 10 MG tablet Take 1 tablet (10 mg total) by mouth daily. One tab daily for allergies  30 tablet  1  . fluticasone (FLONASE) 50 MCG/ACT nasal spray Place 1 spray into the nose 2 (two) times daily.  1 g  2   No current facility-administered medications on file prior to visit.    ROS:  Out of a complete 14 system review of symptoms, the patient complains only of the following symptoms, and all other reviewed systems are negative.  Headache Agitation Depression  Blood pressure 140/85, pulse 78,  weight 155 lb (70.308 kg).  Physical Exam  General: The patient is alert and cooperative at the time of the examination.  Skin: No significant peripheral edema is noted.   Neurologic Exam  Mental status: The patient is oriented x 3.  Cranial nerves: Facial symmetry is present. Speech is normal, no aphasia or dysarthria is noted. Extraocular movements are full. Visual fields are full.  Motor: The patient has good strength in all 4 extremities.  Sensory examination: Soft touch sensation on the face, arms, and legs is symmetric.  Coordination: The patient has good finger-nose-finger and heel-to-shin bilaterally.  Gait and station: The patient has a normal gait. Tandem gait is minimally unsteady. Romberg is negative.  No drift is seen.  Reflexes: Deep tendon reflexes are symmetric.   Assessment/Plan:  1. History of headache  2. Right hemiparesis  3. Mood disorder  The patient will be re-referred for psychiatric evaluation. The patient is having problems with anger management, and there may be some underlying depression. The patient will be increased on the Depakote taking 500 mg twice daily. This may help the mood issues and the migraine headache. The patient will followup in about 6 months.  Marlan Palau MD 07/16/2013 4:44 PM  [facility] [address] [place], [place] [address]  Phone [phone] Fax [phone]

## 2013-07-27 ENCOUNTER — Ambulatory Visit: Payer: Medicare PPO | Admitting: Nurse Practitioner

## 2013-11-18 ENCOUNTER — Emergency Department (HOSPITAL_COMMUNITY)
Admission: EM | Admit: 2013-11-18 | Discharge: 2013-11-18 | Disposition: A | Discharge status: Home / Self Care | Payer: Medicare PPO | Source: Home / Self Care | Attending: Family Medicine | Admitting: Family Medicine

## 2013-11-18 ENCOUNTER — Encounter (HOSPITAL_COMMUNITY): Payer: Self-pay | Admitting: Emergency Medicine

## 2013-11-18 DIAGNOSIS — J329 Chronic sinusitis, unspecified: Secondary | ICD-10-CM

## 2013-11-18 MED ORDER — AMOXICILLIN 875 MG PO TABS: 875.0000 mg | ORAL_TABLET | Freq: Two times a day (BID) | ORAL | Status: DC

## 2013-11-18 NOTE — ED Provider Notes (Signed)
CSN: [id number]     Arrival date & time 11/18/13  1703 History   First MD Initiated Contact with Patient 11/18/13 1936     Chief Complaint  Patient presents with  . URI   (Consider location/radiation/quality/duration/timing/severity/associated sxs/prior Treatment) HPI Comments: Pt reports 2 days of green/yellow nasal drainage and fever.   Patient is a 53 y.o. male presenting with URI. The history is provided by the patient.  URI Presenting symptoms: congestion, cough, fatigue and fever   Presenting symptoms: no ear pain, no facial pain, no rhinorrhea and no sore throat   Severity:  Severe Onset quality:  Gradual Duration:  2 days Timing:  Constant Progression:  Worsening Chronicity:  New Relieved by:  Nothing Worsened by:  Nothing tried Ineffective treatments: zyrtec. Associated symptoms: myalgias and sinus pain   Associated symptoms: no arthralgias     Past Medical History  Diagnosis Date  . Bladder disease   . Environmental allergies   . History of headache   . Asthma   . Benign enlargement of prostate   . 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 Memory change 01/25/2013   Past Surgical History  Procedure Laterality Date  . Abdominal surgery    . Brain surgery     Family History  Problem Relation Age of Onset  . Lupus Mother   . Arthritis/Rheumatoid Mother   . Hypertension Father   . Heart disease Father   . Sarcoidosis Sister   . Cancer Maternal Grandmother   . Heart disease Maternal Grandfather   . Cancer - Prostate Paternal Grandfather    History  Substance Use Topics  . Smoking status: Never Smoker   . Smokeless tobacco: Not on file  . Alcohol Use: Yes     Comment: socially    Review of Systems  Constitutional: Positive for fever, chills and fatigue.  HENT: Positive for congestion, postnasal drip and sinus pressure. Negative for ear pain, rhinorrhea and sore throat.   Respiratory: Positive for cough.   Musculoskeletal:  Positive for myalgias. Negative for arthralgias.    Allergies  Review of patient's allergies indicates no known allergies.  Home Medications   Current Outpatient Rx  Name  Route  Sig  Dispense  Refill  . acetaminophen (TYLENOL) 325 MG tablet   Oral   Take 650 mg by mouth every 6 (six) hours as needed.         Marland Kitchen. albuterol (PROVENTIL HFA;VENTOLIN HFA) 108 (90 BASE) MCG/ACT inhaler   Inhalation   Inhale 2 puffs into the lungs every 6 (six) hours as needed for wheezing.         Marland Kitchen. amoxicillin (AMOXIL) 875 MG tablet   Oral   Take 1 tablet (875 mg total) by mouth 2 (two) times daily.   20 tablet   0   . EXPIRED: cetirizine (ZYRTEC) 10 MG tablet   Oral   Take 1 tablet (10 mg total) by mouth daily. One tab daily for allergies   30 tablet   1   . divalproex (DEPAKOTE ER) 500 MG 24 hr tablet   Oral   Take 1 tablet (500 mg total) by mouth 2 (two) times daily.   60 tablet   5   . EXPIRED: fluticasone (FLONASE) 50 MCG/ACT nasal spray   Nasal   Place 1 spray into the nose 2 (two) times daily.   1 g   2   . FLUZONE QUADRIVALENT 0.5 ML injection  Intramuscular   Inject 0.5 mLs into the muscle Once PRN.         . solifenacin (VESICARE) 5 MG tablet   Oral   Take 10 mg by mouth daily.           . Tamsulosin HCl (FLOMAX) 0.4 MG CAPS   Oral   Take 0.4 mg by mouth daily.          Marland Kitchen zolmitriptan (ZOMIG) 5 MG tablet   Oral   Take 1 tablet (5 mg total) by mouth 2 (two) times daily as needed for migraine.   10 tablet   5    BP 138/95  Pulse 102  Temp(Src) 100 F (37.8 C) (Oral)  Resp 12  SpO2 99% Physical Exam  Constitutional: He appears well-developed and well-nourished. He appears ill. No distress.  HENT:  Right Ear: Tympanic membrane, external ear and ear canal normal.  Left Ear: Tympanic membrane, external ear and ear canal normal.  Nose: Mucosal edema present. Right sinus exhibits frontal sinus tenderness. Right sinus exhibits no maxillary sinus  tenderness. Left sinus exhibits frontal sinus tenderness. Left sinus exhibits no maxillary sinus tenderness.  Mouth/Throat: Oropharynx is clear and moist.  Cardiovascular: Normal rate and regular rhythm.   Pulmonary/Chest: Effort normal and breath sounds normal.  Lymphadenopathy:       Head (right side): No submental, no submandibular and no tonsillar adenopathy present.       Head (left side): No submental, no submandibular and no tonsillar adenopathy present.    He has no cervical adenopathy.    ED Course  Procedures (including critical care time) Labs Review Labs Reviewed - No data to display Imaging Review No results found.   MDM   1. Sinusitis   rx amoxicillin 875mg  BID #20     Cathlyn Parsons, NP 11/18/13 1948

## 2013-11-18 NOTE — Discharge Instructions (Signed)

## 2013-11-18 NOTE — ED Provider Notes (Signed)
Medical screening examination/treatment/procedure(s) were performed by resident physician or non-physician practitioner and as supervising physician I was immediately available for consultation/collaboration.   Draysen Weygandt DOUGLAS MD.   Talicia Sui D Lamondre Wesche, MD 11/18/13 2148 

## 2014-01-14 ENCOUNTER — Ambulatory Visit (INDEPENDENT_AMBULATORY_CARE_PROVIDER_SITE_OTHER): Payer: Medicare PPO | Admitting: Neurology

## 2014-01-14 ENCOUNTER — Encounter (INDEPENDENT_AMBULATORY_CARE_PROVIDER_SITE_OTHER): Payer: Self-pay

## 2014-01-14 ENCOUNTER — Encounter: Payer: Self-pay | Admitting: Neurology

## 2014-01-14 VITALS — BP 117/77 | HR 87 | Wt 159.0 lb

## 2014-01-14 DIAGNOSIS — Z5181 Encounter for therapeutic drug level monitoring: Secondary | ICD-10-CM

## 2014-01-14 DIAGNOSIS — G43909 Migraine, unspecified, not intractable, without status migrainosus: Secondary | ICD-10-CM

## 2014-01-14 DIAGNOSIS — R413 Other amnesia: Secondary | ICD-10-CM

## 2014-01-14 DIAGNOSIS — G2581 Restless legs syndrome: Secondary | ICD-10-CM | POA: Insufficient documentation

## 2014-01-14 HISTORY — DX: Restless legs syndrome: G25.81

## 2014-01-14 MED ORDER — MIRTAZAPINE 30 MG PO TABS: 30.0000 mg | ORAL_TABLET | Freq: Every day | ORAL | Status: DC

## 2014-01-14 NOTE — Patient Instructions (Signed)
Migraine Headache A migraine headache is an intense, throbbing pain on one or both sides of your head. A migraine can last for 30 minutes to several hours. CAUSES  The exact cause of a migraine headache is not always known. However, a migraine may be caused when nerves in the brain become irritated and release chemicals that cause inflammation. This causes pain. Certain things may also trigger migraines, such as:  Alcohol.  Smoking.  Stress.  Menstruation.  Aged cheeses.  Foods or drinks that contain nitrates, glutamate, aspartame, or tyramine.  Lack of sleep.  Chocolate.  Caffeine.  Hunger.  Physical exertion.  Fatigue.  Medicines used to treat chest pain (nitroglycerine), birth control pills, estrogen, and some blood pressure medicines. SIGNS AND SYMPTOMS  Pain on one or both sides of your head.  Pulsating or throbbing pain.  Severe pain that prevents daily activities.  Pain that is aggravated by any physical activity.  Nausea, vomiting, or both.  Dizziness.  Pain with exposure to bright lights, loud noises, or activity.  General sensitivity to bright lights, loud noises, or smells. Before you get a migraine, you may get warning signs that a migraine is coming (aura). An aura may include:  Seeing flashing lights.  Seeing bright spots, halos, or zig-zag lines.  Having tunnel vision or blurred vision.  Having feelings of numbness or tingling.  Having trouble talking.  Having muscle weakness. DIAGNOSIS  A migraine headache is often diagnosed based on:  Symptoms.  Physical exam.  A CT scan or MRI of your head. These imaging tests cannot diagnose migraines, but they can help rule out other causes of headaches. TREATMENT Medicines may be given for pain and nausea. Medicines can also be given to help prevent recurrent migraines.  HOME CARE INSTRUCTIONS  Only take over-the-counter or prescription medicines for pain or discomfort as directed by your  health care provider. The use of long-term narcotics is not recommended.  Lie down in a dark, quiet room when you have a migraine.  Keep a journal to find out what may trigger your migraine headaches. For example, write down:  What you eat and drink.  How much sleep you get.  Any change to your diet or medicines.  Limit alcohol consumption.  Quit smoking if you smoke.  Get 7 9 hours of sleep, or as recommended by your health care provider.  Limit stress.  Keep lights dim if bright lights bother you and make your migraines worse. SEEK IMMEDIATE MEDICAL CARE IF:   Your migraine becomes severe.  You have a fever.  You have a stiff neck.  You have vision loss.  You have muscular weakness or loss of muscle control.  You start losing your balance or have trouble walking.  You feel faint or pass out.  You have severe symptoms that are different from your first symptoms. MAKE SURE YOU:   Understand these instructions.  Will watch your condition.  Will get help right away if you are not doing well or get worse. Document Released: 07/29/2005 Document Revised: 05/19/2013 Document Reviewed: 04/05/2013 [employer] Patient Information 2014 [employer].  

## 2014-01-14 NOTE — Progress Notes (Signed)
Reason for visit: Headache, memory problems  Travis Bryant is an 53 y.o. male  History of present illness:  Travis Bryant is a 53 year old right-handed black male with a history of headaches. The patient is on Depakote for this and as a mood stabilizer. The patient has had episodes of emotional instability. He has some difficulty sleeping in part related to frequency of urination and in part related to some issues with restless leg syndrome that has come on within the last several months. He indicates that he may wake up frequently at night, and occasionally during the day he still has episodes of impending doom. The patient has ongoing problems with fatigue and excessive daytime drowsiness. He has good days and bad days with the cognitive processing. The patient still has a sore area in the left parietal area. 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 He returns to this office for an evaluation. He is tolerating the Depakote well.   Past Medical History  Diagnosis Date  . Bladder disease   . Environmental allergies   . History of headache   . Asthma   . Benign enlargement of prostate   . 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 Memory change 01/25/2013  . Restless legs syndrome (RLS) 01/14/2014    Past Surgical History  Procedure Laterality Date  . Abdominal surgery    . Brain surgery      Family History  Problem Relation Age of Onset  . Lupus Mother   . Arthritis/Rheumatoid Mother   . Hypertension Father   . Heart disease Father   . Sarcoidosis Sister   . Cancer Maternal Grandmother   . Heart disease Maternal Grandfather   . Cancer - Prostate Paternal Grandfather     Social history:  reports that he has never smoked. He has never used smokeless tobacco. He reports that he drinks alcohol. He reports that he does not use illicit drugs.   No Known Allergies  Medications:  Current  Outpatient Prescriptions on File Prior to Visit  Medication Sig Dispense Refill  . acetaminophen (TYLENOL) 325 MG tablet Take 650 mg by mouth every 6 (six) hours as needed.      Marland Kitchen albuterol (PROVENTIL HFA;VENTOLIN HFA) 108 (90 BASE) MCG/ACT inhaler Inhale 2 puffs into the lungs every 6 (six) hours as needed for wheezing.      . divalproex (DEPAKOTE ER) 500 MG 24 hr tablet Take 1 tablet (500 mg total) by mouth 2 (two) times daily.  60 tablet  5  . FLUZONE QUADRIVALENT 0.5 ML injection Inject 0.5 mLs into the muscle Once PRN.      . solifenacin (VESICARE) 5 MG tablet Take 10 mg by mouth daily.        . Tamsulosin HCl (FLOMAX) 0.4 MG CAPS Take 0.4 mg by mouth daily.       Marland Kitchen zolmitriptan (ZOMIG) 5 MG tablet Take 1 tablet (5 mg total) by mouth 2 (two) times daily as needed for migraine.  10 tablet  5  . cetirizine (ZYRTEC) 10 MG tablet Take 1 tablet (10 mg total) by mouth daily. One tab daily for allergies  30 tablet  1  . fluticasone (FLONASE) 50 MCG/ACT nasal spray Place 1 spray into the nose 2 (two) times daily.  1 g  2   No current facility-administered medications on file prior to visit.  ROS:  Out of a complete 14 system review of symptoms, the patient complains only of the following symptoms, and all other reviewed systems are negative.  Fatigue  Ringing in ears Itching of the eye Blurred vision Cough, wheezing Restless legs, insomnia, frequent waking Difficulty urinating, painful urination, frequency of urination, urgency Joint pain, achy muscles, muscle cramps Memory problems, dizziness, headache Agitation, confusion, anxiety  Blood pressure 117/77, pulse 87, weight 159 lb (72.122 kg).  Physical Exam  General: The patient is alert and cooperative at the time of the examination.  Neuromuscular: The patient has a slightly smaller right hand and forearm as compared to the left.  Skin: No significant peripheral edema is noted.   Neurologic Exam  Mental status: The  Mini-Mental status examination done today shows a total score of 26/30.   Cranial nerves: Facial symmetry is present. Speech is normal, no aphasia or dysarthria is noted. Extraocular movements are full. Visual fields are full.  Motor: The patient has good strength in all 4 extremities.  Sensory examination: soft touch sensation is symmetric on the face, arms, and legs.   Coordination: The patient has good finger-nose-finger and heel-to-shin bilaterally.  Gait and station: The patient has a normal gait. Tandem gait is slightly unsteady. Romberg is negative. No drift is seen.  Reflexes: Deep tendon reflexes are slightly elevated on the right arm and right leg relative to the left.   MRI brain 02/09/13:  IMPRESSION:  Abnormal MRI brain (without) demonstrating: 1. 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  2. Few other scattered periventricular and subcortical foci of non-specific gliosis. 3. No acute findings.    Assessment/Plan:  One. Reported memory disturbance  2. Migraine headache  3. Restless leg syndrome  4. Probable anxiety, depression  The patient never went for the psychiatric referral we set up. On Mini-Mental status examination, there appears to have been some progression. The patient will be set up for blood work he never had done on the last revisit. He will be placed on mirtazapine at night for sleep and as a treatment for his underlying depression and anxiety. This could potentially worsen the restless leg syndrome. The patient will followup through this office in about 6 months. If the memory issue appears to be ongoing and progressive, Aricept will be added in the future.  Marlan Palau. Ekam Bhavik Cabiness MD 01/14/2014 12:10 PM  [facility] [address] [place] [id number]  Phone [phone] Fax [phone]

## 2014-01-15 LAB — CBC WITH DIFFERENTIAL
BASOS: 1 %
Basophils Absolute: 0 10*3/uL (ref 0.0–0.2)
EOS ABS: 0.2 10*3/uL (ref 0.0–0.4)
EOS: 6 %
HEMATOCRIT: 42 % (ref 37.5–51.0)
HEMOGLOBIN: 14.1 g/dL (ref 12.6–17.7)
IMMATURE GRANS (ABS): 0 10*3/uL (ref 0.0–0.1)
Immature Granulocytes: 0 %
LYMPHS: 43 %
Lymphocytes Absolute: 1.4 10*3/uL (ref 0.7–3.1)
MCH: 30.9 pg (ref 26.6–33.0)
MCHC: 33.6 g/dL (ref 31.5–35.7)
MCV: 92 fL (ref 79–97)
Monocytes Absolute: 0.3 10*3/uL (ref 0.1–0.9)
Monocytes: 10 %
Neutrophils Absolute: 1.3 10*3/uL — ABNORMAL LOW (ref 1.4–7.0)
Neutrophils Relative %: 40 %
Platelets: 204 10*3/uL (ref 150–379)
RBC: 4.57 x10E6/uL (ref 4.14–5.80)
RDW: 14.1 % (ref 12.3–15.4)
WBC: 3.3 10*3/uL — ABNORMAL LOW (ref 3.4–10.8)

## 2014-01-15 LAB — COMPREHENSIVE METABOLIC PANEL
ALT: 12 IU/L (ref 0–44)
AST: 17 IU/L (ref 0–40)
Albumin/Globulin Ratio: 1.7 (ref 1.1–2.5)
Albumin: 4.6 g/dL (ref 3.5–5.5)
Alkaline Phosphatase: 64 IU/L (ref 39–117)
BUN/Creatinine Ratio: 13 (ref 9–20)
BUN: 17 mg/dL (ref 6–24)
CALCIUM: 9.2 mg/dL (ref 8.7–10.2)
CO2: 25 mmol/L (ref 18–29)
Chloride: 103 mmol/L (ref 97–108)
Creatinine, Ser: 1.27 mg/dL (ref 0.76–1.27)
GFR calc Af Amer: 74 mL/min/{1.73_m2} (ref >59)
GFR calc non Af Amer: 64 mL/min/{1.73_m2} (ref >59)
GLUCOSE: 88 mg/dL (ref 65–99)
Globulin, Total: 2.7 g/dL (ref 1.5–4.5)
Potassium: 4.8 mmol/L (ref 3.5–5.2)
Sodium: 141 mmol/L (ref 134–144)
TOTAL PROTEIN: 7.3 g/dL (ref 6.0–8.5)
Total Bilirubin: 0.6 mg/dL (ref 0.0–1.2)

## 2014-01-15 LAB — RPR: SYPHILIS RPR SCR: NONREACTIVE

## 2014-01-15 LAB — HIV ANTIBODY (ROUTINE TESTING W REFLEX): HIV 1/HIV 2 AB: NONREACTIVE

## 2014-01-15 LAB — VALPROIC ACID LEVEL

## 2014-01-15 LAB — VITAMIN B12: VITAMIN B 12: 307 pg/mL (ref 211–946)

## 2014-01-15 LAB — TSH: TSH: 0.977 u[IU]/mL (ref 0.450–4.500)

## 2014-01-15 LAB — AMMONIA: AMMONIA: 69 ug/dL (ref 27–102)

## 2014-01-17 NOTE — Progress Notes (Signed)
Quick Note:  I called pt and gave the lab results and comments. I let him know that if concerning labs (wbc) Dr.Willis would get other labs, or speak to you himself. Since stable, no other work up needed. Relayed after he questioned sleep medication. I told him that Remeron was called to [facility]. He verbalized understanding. ______

## 2014-05-01 ENCOUNTER — Emergency Department (HOSPITAL_COMMUNITY)
Admission: EM | Admit: 2014-05-01 | Discharge: 2014-05-01 | Disposition: A | Discharge status: Home / Self Care | Payer: Medicare PPO | Source: Home / Self Care

## 2014-05-01 ENCOUNTER — Encounter (HOSPITAL_COMMUNITY): Payer: Self-pay | Admitting: Emergency Medicine

## 2014-05-01 DIAGNOSIS — J309 Allergic rhinitis, unspecified: Secondary | ICD-10-CM

## 2014-05-01 DIAGNOSIS — J4520 Mild intermittent asthma, uncomplicated: Secondary | ICD-10-CM

## 2014-05-01 DIAGNOSIS — J45909 Unspecified asthma, uncomplicated: Secondary | ICD-10-CM

## 2014-05-01 DIAGNOSIS — R252 Cramp and spasm: Secondary | ICD-10-CM

## 2014-05-01 LAB — D-DIMER, QUANTITATIVE: D-Dimer, Quant: <0.27 ug/mL-FEU (ref 0.00–0.48)

## 2014-05-01 MED ORDER — ALBUTEROL SULFATE (2.5 MG/3ML) 0.083% IN NEBU
INHALATION_SOLUTION | RESPIRATORY_TRACT | Status: AC
  Filled 2014-05-01: qty 3

## 2014-05-01 MED ORDER — ALBUTEROL SULFATE (2.5 MG/3ML) 0.083% IN NEBU
2.5000 mg | INHALATION_SOLUTION | Freq: Once | RESPIRATORY_TRACT | Status: AC
Start: 2014-05-01 — End: 2014-05-01
  Administered 2014-05-01: 2.5 mg via RESPIRATORY_TRACT

## 2014-05-01 MED ORDER — ALBUTEROL SULFATE HFA 108 (90 BASE) MCG/ACT IN AERS: 1.0000 | INHALATION_SPRAY | Freq: Four times a day (QID) | RESPIRATORY_TRACT | Status: DC | PRN

## 2014-05-01 MED ORDER — CETIRIZINE-PSEUDOEPHEDRINE ER 5-120 MG PO TB12: 1.0000 | ORAL_TABLET | Freq: Every day | ORAL | Status: DC

## 2014-05-01 NOTE — Discharge Instructions (Signed)
Leg Cramps Leg cramps that occur during exercise can be caused by poor circulation or dehydration. However, muscle cramps that occur at rest or during the night are usually not due to any serious medical problem. Heat cramps may cause muscle spasms during hot weather.  CAUSES There is no clear cause for muscle cramps. However, dehydration may be a factor for those who do not drink enough fluids and those who exercise in the heat. Imbalances in the level of sodium, potassium, calcium or magnesium in the muscle tissue may also be a factor. Some medications, such as water pills (diuretics), may cause loss of chemicals that the body needs (like sodium and potassium) and cause muscle cramps. TREATMENT   Make sure your diet has enough fluids and essential minerals for the muscle to work normally.  Avoid strenuous exercise for several days if you have been having frequent leg cramps.  Stretch and massage the cramped muscle for several minutes.  Some medicines may be helpful in some patients with night cramps. Only take over-the-counter or prescription medicines as directed by your caregiver. SEEK IMMEDIATE MEDICAL CARE IF:   Your leg cramps become worse.  Your foot becomes cold, numb, or blue. Document Released: 09/05/2004 Document Revised: 10/21/2011 Document Reviewed: 08/23/2008 [facility] Patient Information 2015 [place], [place]. This information is not intended to replace advice given to you by your health care provider. Make sure you discuss any questions you have with your health care provider.   Asthma Attack Prevention Although there is no way to prevent asthma from starting, you can take steps to control the disease and reduce its symptoms. Learn about your asthma and how to control it. Take an active role to control your asthma by working with your health care provider to create and follow an asthma action plan. An asthma action plan guides you in:  Taking your medicines  properly.  Avoiding things that set off your asthma or make your asthma worse (asthma triggers).  Tracking your level of asthma control.  Responding to worsening asthma.  Seeking emergency care when needed. To track your asthma, keep records of your symptoms, check your peak flow number using a handheld device that shows how well air moves out of your lungs (peak flow meter), and get regular asthma checkups.  WHAT ARE SOME WAYS TO PREVENT AN ASTHMA ATTACK?  Take medicines as directed by your health care provider.  Keep track of your asthma symptoms and level of control.  With your health care provider, write a detailed plan for taking medicines and managing an asthma attack. Then be sure to follow your action plan. Asthma is an ongoing condition that needs regular monitoring and treatment.  Identify and avoid asthma triggers. Many outdoor allergens and irritants (such as pollen, mold, cold air, and air pollution) can trigger asthma attacks. Find out what your asthma triggers are and take steps to avoid them.  Monitor your breathing. Learn to recognize warning signs of an attack, such as coughing, wheezing, or shortness of breath. Your lung function may decrease before you notice any signs or symptoms, so regularly measure and record your peak airflow with a home peak flow meter.  Identify and treat attacks early. If you act quickly, you are less likely to have a severe attack. You will also need less medicine to control your symptoms. When your peak flow measurements decrease and alert you to an upcoming attack, take your medicine as instructed and immediately stop any activity that may have triggered the attack.  If your symptoms do not improve, get medical help.  Pay attention to increasing quick-relief inhaler use. If you find yourself relying on your quick-relief inhaler, your asthma is not under control. See your health care provider about adjusting your treatment. WHAT CAN MAKE MY  SYMPTOMS WORSE? A number of common things can set off or make your asthma symptoms worse and cause temporary increased inflammation of your airways. Keep track of your asthma symptoms for several weeks, detailing all the environmental and emotional factors that are linked with your asthma. When you have an asthma attack, go back to your asthma diary to see which factor, or combination of factors, might have contributed to it. Once you know what these factors are, you can take steps to control many of them. If you have allergies and asthma, it is important to take asthma prevention steps at home. Minimizing contact with the substance to which you are allergic will help prevent an asthma attack. Some triggers and ways to avoid these triggers are: Animal Dander:  Some people are allergic to the flakes of skin or dried saliva from animals with fur or feathers.   There is no such thing as a hypoallergenic dog or cat breed. All dogs or cats can cause allergies, even if they don't shed.  Keep these pets out of your home.  If you are not able to keep a pet outdoors, keep the pet out of your bedroom and other sleeping areas at all times, and keep the door closed.  Remove carpets and furniture covered with cloth from your home. If that is not possible, keep the pet away from fabric-covered furniture and carpets. Dust Mites: Many people with asthma are allergic to dust mites. Dust mites are tiny bugs that are found in every home in mattresses, pillows, carpets, fabric-covered furniture, bedcovers, clothes, stuffed toys, and other fabric-covered items.   Cover your mattress in a special dust-proof cover.  Cover your pillow in a special dust-proof cover, or wash the pillow each week in hot water. Water must be hotter than 130 F (54.4 C) to kill dust mites. Cold or warm water used with detergent and bleach can also be effective.  Wash the sheets and blankets on your bed each week in hot water.  Try not to  sleep or lie on cloth-covered cushions.  Call ahead when traveling and ask for a smoke-free hotel room. Bring your own bedding and pillows in case the hotel only supplies feather pillows and down comforters, which may contain dust mites and cause asthma symptoms.  Remove carpets from your bedroom and those laid on concrete, if you can.  Keep stuffed toys out of the bed, or wash the toys weekly in hot water or cooler water with detergent and bleach. Cockroaches: Many people with asthma are allergic to the droppings and remains of cockroaches.   Keep food and garbage in closed containers. Never leave food out.  Use poison baits, traps, powders, gels, or paste (for example, boric acid).  If a spray is used to kill cockroaches, stay out of the room until the odor goes away. Indoor Mold:  Fix leaky faucets, pipes, or other sources of water that have mold around them.  Clean floors and moldy surfaces with a fungicide or diluted bleach.  Avoid using humidifiers, vaporizers, or swamp coolers. These can spread molds through the air. Pollen and Outdoor Mold:  When pollen or mold spore counts are high, try to keep your windows closed.  Stay indoors with  windows closed from late morning to afternoon. Pollen and some mold spore counts are highest at that time.  Ask your health care provider whether you need to take anti-inflammatory medicine or increase your dose of the medicine before your allergy season starts. Other Irritants to Avoid:  Tobacco smoke is an irritant. If you smoke, ask your health care provider how you can quit. Ask family members to quit smoking, too. Do not allow smoking in your home or car.  If possible, do not use a wood-burning stove, kerosene heater, or fireplace. Minimize exposure to all sources of smoke, including incense, candles, fires, and fireworks.  Try to stay away from strong odors and sprays, such as perfume, talcum powder, hair spray, and paints.  Decrease  humidity in your home and use an indoor air cleaning device. Reduce indoor humidity to below 60%. Dehumidifiers or central air conditioners can do this.  Decrease house dust exposure by changing furnace and air cooler filters frequently.  Try to have someone else vacuum for you once or twice a week. Stay out of rooms while they are being vacuumed and for a short while afterward.  If you vacuum, use a dust mask from a hardware store, a double-layered or microfilter vacuum cleaner bag, or a vacuum cleaner with a HEPA filter.  Sulfites in foods and beverages can be irritants. Do not drink beer or wine or eat dried fruit, processed potatoes, or shrimp if they cause asthma symptoms.  Cold air can trigger an asthma attack. Cover your nose and mouth with a scarf on cold or windy days.  Several health conditions can make asthma more difficult to manage, including a runny nose, sinus infections, reflux disease, psychological stress, and sleep apnea. Work with your health care provider to manage these conditions.  Avoid close contact with people who have a respiratory infection such as a cold or the flu, since your asthma symptoms may get worse if you catch the infection. Wash your hands thoroughly after touching items that may have been handled by people with a respiratory infection.  Get a flu shot every year to protect against the flu virus, which often makes asthma worse for days or weeks. Also get a pneumonia shot if you have not previously had one. Unlike the flu shot, the pneumonia shot does not need to be given yearly. Medicines:  Talk to your health care provider about whether it is safe for you to take aspirin or non-steroidal anti-inflammatory medicines (NSAIDs). In a small number of people with asthma, aspirin and NSAIDs can cause asthma attacks. These medicines must be avoided by people who have known aspirin-sensitive asthma. It is important that people with aspirin-sensitive asthma read  labels of all over-the-counter medicines used to treat pain, colds, coughs, and fever.  Beta-blockers and ACE inhibitors are other medicines you should discuss with your health care provider. HOW CAN I FIND OUT WHAT I AM ALLERGIC TO? Ask your asthma health care provider about allergy skin testing or blood testing (the RAST test) to identify the allergens to which you are sensitive. If you are found to have allergies, the most important thing to do is to try to avoid exposure to any allergens that you are sensitive to as much as possible. Other treatments for allergies, such as medicines and allergy shots (immunotherapy) are available.  CAN I EXERCISE? Follow your health care provider's advice regarding asthma treatment before exercising. It is important to maintain a regular exercise program, but vigorous exercise or exercise in  cold, humid, or dry environments can cause asthma attacks, especially for those people who have exercise-induced asthma. Document Released: 07/17/2009 Document Revised: 08/03/2013 Document Reviewed: 02/03/2013 [facility] Patient Information 2015 [place], [place]. This information is not intended to replace advice given to you by your health care provider. Make sure you discuss any questions you have with your health care provider.

## 2014-05-01 NOTE — ED Provider Notes (Signed)
CSN: [id number]     Arrival date & time 05/01/14  1611 History   None    Chief Complaint  Patient presents with  . Chest Pain   (Consider location/radiation/quality/duration/timing/severity/associated sxs/prior Treatment)  HPI  Patient is a 53 year old male presenting tonight with complaints of right lower leg cramping approximately one week ago with some intermittent "chest tightness and chest pains with a deep breath".  Patient is very concerned about a "blood clot".  Patient denies any loss of consciousness, chills, fever, diaphoresis, nausea, vomiting, diarrhea or other generalized malaise.  Patient states chest pain is "tight" and nonradiating in nature. Patient states history of asthma and seasonal allergies. Patient admits to not taking his seasonal allergy medication as directed for the past several weeks.  Past Medical History  Diagnosis Date  . Bladder disease   . Environmental allergies   . History of headache   . Asthma   . Benign enlargement of prostate   . 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 Memory change 01/25/2013  . Restless legs syndrome (RLS) 01/14/2014   Past Surgical History  Procedure Laterality Date  . Abdominal surgery    . Brain surgery     Family History  Problem Relation Age of Onset  . Lupus Mother   . Arthritis/Rheumatoid Mother   . Hypertension Father   . Heart disease Father   . Sarcoidosis Sister   . Cancer Maternal Grandmother   . Heart disease Maternal Grandfather   . Cancer - Prostate Paternal Grandfather    History  Substance Use Topics  . Smoking status: Never Smoker   . Smokeless tobacco: Never Used  . Alcohol Use: Yes     Comment: socially    Review of Systems  Constitutional: Negative.  Negative for fever, chills, diaphoresis and fatigue.  HENT: Negative.  Negative for congestion and sore throat.   Eyes: Negative.   Respiratory: Positive for chest tightness and shortness of breath. Negative for apnea,  cough and wheezing.   Cardiovascular: Positive for chest pain. Negative for palpitations and leg swelling.  Gastrointestinal: Negative.  Negative for nausea, vomiting, abdominal pain, diarrhea and constipation.  Endocrine: Negative.   Genitourinary: Negative.   Musculoskeletal: Negative.   Skin: Negative.  Negative for pallor and rash.  Allergic/Immunologic: Negative.   Neurological: Positive for dizziness. Negative for weakness and light-headedness.  Hematological: Negative.   Psychiatric/Behavioral: Negative.     Allergies  Review of patient's allergies indicates no known allergies.  Home Medications   Prior to Admission medications   Medication Sig Start Date End Date Taking? Authorizing Provider  acetaminophen (TYLENOL) 325 MG tablet Take 650 mg by mouth every 6 (six) hours as needed.    Historical Provider, MD  albuterol (PROVENTIL HFA;VENTOLIN HFA) 108 (90 BASE) MCG/ACT inhaler Inhale 2 puffs into the lungs every 6 (six) hours as needed for wheezing.    Historical Provider, MD  albuterol (PROVENTIL HFA;VENTOLIN HFA) 108 (90 BASE) MCG/ACT inhaler Inhale 1-2 puffs into the lungs every 6 (six) hours as needed for wheezing or shortness of breath. 05/01/14   Servando Salina, NP  cetirizine (ZYRTEC) 10 MG tablet Take 1 tablet (10 mg total) by mouth daily. One tab daily for allergies 12/07/11 12/06/12  Linna Hoff, MD  cetirizine-pseudoephedrine (ZYRTEC-D) 5-120 MG per tablet Take 1 tablet by mouth daily. 05/01/14   Servando Salina, NP  divalproex (DEPAKOTE ER) 500 MG 24 hr tablet Take 1 tablet (500 mg  total) by mouth 2 (two) times daily. 07/16/13   York Spaniel, MD  fluticasone (FLONASE) 50 MCG/ACT nasal spray Place 1 spray into the nose 2 (two) times daily. 12/07/11 12/06/12  Linna Hoff, MD  FLUZONE QUADRIVALENT 0.5 ML injection Inject 0.5 mLs into the muscle Once PRN. 06/15/13   Historical Provider, MD  mirtazapine (REMERON) 30 MG tablet Take 1 tablet (30 mg total) by mouth at  bedtime. 01/14/14   York Spaniel, MD  solifenacin (VESICARE) 5 MG tablet Take 10 mg by mouth daily.      Historical Provider, MD  Tamsulosin HCl (FLOMAX) 0.4 MG CAPS Take 0.4 mg by mouth daily.     Historical Provider, MD  zolmitriptan (ZOMIG) 5 MG tablet Take 1 tablet (5 mg total) by mouth 2 (two) times daily as needed for migraine. 01/25/13   York Spaniel, MD   BP 144/101  Pulse 86  Temp(Src) 98.5 F (36.9 C) (Oral)  Resp 16  SpO2 98%  Physical Exam  Nursing note and vitals reviewed. Constitutional: He is oriented to person, place, and time. He appears well-developed and well-nourished. No distress.  HENT:  Head: Normocephalic and atraumatic.  Neck: Normal range of motion. Neck supple. No tracheal deviation present.  Cardiovascular: Normal rate, regular rhythm, normal heart sounds and intact distal pulses.  PMI is not displaced.  Exam reveals no gallop, no friction rub and no decreased pulses.   No murmur heard. Pulses:      Radial pulses are 2+ on the right side, and 2+ on the left side.       Dorsalis pedis pulses are 2+ on the right side, and 2+ on the left side.       Posterior tibial pulses are 2+ on the right side, and 2+ on the left side.  Reports reproducible chest pain on left anterior chest with deep breath.  Pulmonary/Chest: Effort normal. No stridor. No respiratory distress. He has no wheezes. He has no rales. He exhibits no tenderness.  Air exchange diminished on expiration along lower fields bilaterally.  Musculoskeletal:       Right lower leg: Normal. He exhibits no tenderness, no bony tenderness, no swelling, no edema, no deformity and no laceration.       Legs: No evidence of swelling erythema or warmth.  Negative Homans sign  Lymphadenopathy:    He has no cervical adenopathy.  Neurological: He is alert and oriented to person, place, and time.  Skin: Skin is warm and dry. No rash noted. He is not diaphoretic. No erythema. No pallor.    ED Course    Procedures (including critical care time) Labs Review Labs Reviewed  D-DIMER, QUANTITATIVE   Results for orders placed during the hospital encounter of 05/01/14  D-DIMER, QUANTITATIVE      Result Value Ref Range   D-Dimer, Quant <0.27  0.00 - 0.48 ug/mL-FEU   EKG normal sinus rhythm.  Imaging Review No results found.   MDM   1. Allergic rhinitis, unspecified allergic rhinitis type   2. Asthma, mild intermittent, uncomplicated   3. Cramps of right lower extremity    Patient given warning signs of acute coronary episode and advise to call 911 or go to emergency department immediately should any of these symptoms manifest. The patient verbalizes understanding and agrees to plan of care.       Servando Salina, NP 05/01/14 1752

## 2014-05-01 NOTE — ED Notes (Signed)
Patient states that 3 days ago, he was driving for about 30 min. When he began to experience a sudden severe pain in tibial area ( not related to any injury) . Also aprox 1 hour later , began to have "tightness" in chest not associated w SOB, sweating , nausea. Has not had any extending trips, flights. Since his discomfort is still persisting ( "1" on 0-10 scale) and that he has multiple friends, family members that have had problems w clots. NAD, w/d/color good

## 2014-05-03 NOTE — ED Provider Notes (Signed)
Medical screening examination/treatment/procedure(s) were performed by a resident physician or non-physician practitioner and as the supervising physician I was immediately available for consultation/collaboration.  Jonnell Hentges, MD    Zamarah Ullmer S Kalianna Verbeke, MD 05/03/14 0746 

## 2014-06-23 ENCOUNTER — Telehealth: Payer: Self-pay | Admitting: Neurology

## 2014-06-23 NOTE — Telephone Encounter (Signed)
Left message for patient regarding rescheduling 07/18/14 appointment per Larita FifeLynn leaving, scheduled patient for first available with Dr. Anne HahnWillis on 10/04/14.

## 2014-07-17 ENCOUNTER — Other Ambulatory Visit: Payer: Self-pay

## 2014-07-17 MED ORDER — MIRTAZAPINE 30 MG PO TABS: 30.0000 mg | ORAL_TABLET | Freq: Every day | ORAL | Status: DC

## 2014-07-18 ENCOUNTER — Encounter (HOSPITAL_COMMUNITY): Payer: Self-pay | Admitting: *Deleted

## 2014-07-18 ENCOUNTER — Ambulatory Visit: Payer: Self-pay | Admitting: Nurse Practitioner

## 2014-07-18 ENCOUNTER — Emergency Department (HOSPITAL_COMMUNITY)
Admission: EM | Admit: 2014-07-18 | Discharge: 2014-07-18 | Disposition: A | Discharge status: Home / Self Care | Payer: Medicare PPO | Source: Home / Self Care | Attending: Family Medicine | Admitting: Family Medicine

## 2014-07-18 DIAGNOSIS — R030 Elevated blood-pressure reading, without diagnosis of hypertension: Secondary | ICD-10-CM

## 2014-07-18 DIAGNOSIS — R059 Cough, unspecified: Secondary | ICD-10-CM

## 2014-07-18 DIAGNOSIS — R05 Cough: Secondary | ICD-10-CM

## 2014-07-18 DIAGNOSIS — IMO0001 Reserved for inherently not codable concepts without codable children: Secondary | ICD-10-CM

## 2014-07-18 MED ORDER — IPRATROPIUM BROMIDE 0.06 % NA SOLN: 2.0000 | Freq: Four times a day (QID) | NASAL | Status: DC

## 2014-07-18 MED ORDER — GUAIFENESIN-CODEINE 100-10 MG/5ML PO SOLN: 5.0000 mL | Freq: Every evening | ORAL | Status: DC | PRN

## 2014-07-18 MED ORDER — IPRATROPIUM-ALBUTEROL 0.5-2.5 (3) MG/3ML IN SOLN
3.0000 mL | Freq: Once | RESPIRATORY_TRACT | Status: AC
  Administered 2014-07-18: 3 mL via RESPIRATORY_TRACT

## 2014-07-18 MED ORDER — SODIUM CHLORIDE 0.9 % IN NEBU
INHALATION_SOLUTION | RESPIRATORY_TRACT | Status: AC
  Filled 2014-07-18: qty 3

## 2014-07-18 MED ORDER — ALBUTEROL SULFATE HFA 108 (90 BASE) MCG/ACT IN AERS: 2.0000 | INHALATION_SPRAY | Freq: Four times a day (QID) | RESPIRATORY_TRACT | Status: DC | PRN

## 2014-07-18 MED ORDER — IPRATROPIUM-ALBUTEROL 0.5-2.5 (3) MG/3ML IN SOLN
RESPIRATORY_TRACT | Status: AC
  Filled 2014-07-18: qty 3

## 2014-07-18 MED ORDER — OMEPRAZOLE 40 MG PO CPDR: 40.0000 mg | DELAYED_RELEASE_CAPSULE | Freq: Every day | ORAL | Status: DC

## 2014-07-18 NOTE — ED Notes (Signed)
C/o cough onset 1 month.  It started as a cold.  Had  fever 1 month ago.  Cough is non-productive and has been wheezing all the time.

## 2014-07-18 NOTE — ED Provider Notes (Signed)
Travis Bryant is a 53 y.o. male who presents to Urgent Care today for cough. Patient has a one-month history of cough. Initially he had fevers and chills about a month ago which has since resolved. Since then he's had continued nonproductive cough. He does note some wheezing. Additionally he has heartburn and acid reflux symptoms. He's tried Zyrtec which help some. No vomiting or diarrhea.   Past Medical History  Diagnosis Date  . Bladder disease   . Environmental allergies   . History of headache   . Asthma   . Benign enlargement of prostate   . 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 Memory change 01/25/2013  . Restless legs syndrome (RLS) 01/14/2014   Past Surgical History  Procedure Laterality Date  . Abdominal surgery    . Brain surgery     History  Substance Use Topics  . Smoking status: Never Smoker   . Smokeless tobacco: Never Used  . Alcohol Use: Yes     Comment: socially   ROS as above Medications: No current facility-administered medications for this encounter.   Current Outpatient Prescriptions  Medication Sig Dispense Refill  . acetaminophen (TYLENOL) 325 MG tablet Take 650 mg by mouth every 6 (six) hours as needed.    . cetirizine (ZYRTEC) 10 MG tablet Take 10 mg by mouth daily.    . Tamsulosin HCl (FLOMAX) 0.4 MG CAPS Take 0.4 mg by mouth daily.     Marland Kitchen. albuterol (PROVENTIL HFA;VENTOLIN HFA) 108 (90 BASE) MCG/ACT inhaler Inhale 2 puffs into the lungs every 6 (six) hours as needed for wheezing or shortness of breath. 1 Inhaler 2  . fluticasone (FLONASE) 50 MCG/ACT nasal spray Place 1 spray into the nose 2 (two) times daily. 1 g 2  . guaiFENesin-codeine 100-10 MG/5ML syrup Take 5 mLs by mouth at bedtime as needed for cough. 120 mL 0  . ipratropium (ATROVENT) 0.06 % nasal spray Place 2 sprays into both nostrils 4 (four) times daily. 15 mL 1  . omeprazole (PRILOSEC) 40 MG capsule Take 1 capsule (40 mg total) by mouth daily. 30 capsule 1  .  [DISCONTINUED] mirtazapine (REMERON) 30 MG tablet Take 1 tablet (30 mg total) by mouth at bedtime. 90 tablet 0  . [DISCONTINUED] solifenacin (VESICARE) 5 MG tablet Take 10 mg by mouth daily.      . [DISCONTINUED] zolmitriptan (ZOMIG) 5 MG tablet Take 1 tablet (5 mg total) by mouth 2 (two) times daily as needed for migraine. 10 tablet 5   No Known Allergies   Exam:  BP 160/92 mmHg  Pulse 78  Temp(Src) 98.2 F (36.8 C) (Oral)  Resp 14  SpO2 98% Gen: Well NAD HEENT: EOMI,  MMM posterior pharynx with cobblestoning. Normal tympanic membranes bilaterally. Lungs: Normal work of breathing. CTABL Heart: RRR no MRG Abd: NABS, Soft. Nondistended, Nontender Exts: Brisk capillary refill, warm and well perfused.   Patient was given a 2.5/0.5 mg DuoNeb nebulizer treatment, and felt better  No results found for this or any previous visit (from the past 24 hour(s)). No results found.  Assessment and Plan: 53 y.o. male with cough likely postviral versus acid reflux versus asthmatic. Treatment with omeprazole albuterol Atrovent nasal spray and codeine cough medication.  Recommend patient follow-up with PCP regarding elevated blood pressure.  Discussed warning signs or symptoms. Please see discharge instructions. Patient expresses understanding.     Rodolph BongEvan S Carrolyn Hilmes, MD 07/18/14 [phone]

## 2014-07-18 NOTE — Discharge Instructions (Signed)
Thank you for coming in today. Use albuterol for cough.  Take omeprazole daily. Use the nasal spray Take codeine cough medicine as needed. Do not drive after taking this medicine.  Follow up with a primary doctor about your blood pressure.  Call or go to the emergency room if you get worse, have trouble breathing, have chest pains, or palpitations.    Cough, Adult  A cough is a reflex. It helps you clear your throat and airways. A cough can help heal your body. A cough can last 2 or 3 weeks (acute) or may last more than 8 weeks (chronic). Some common causes of a cough can include an infection, allergy, or a cold. HOME CARE  Only take medicine as told by your doctor.  If given, take your medicines (antibiotics) as told. Finish them even if you start to feel better.  Use a cold steam vaporizer or humidifier in your home. This can help loosen thick spit (secretions).  Sleep so you are almost sitting up (semi-upright). Use pillows to do this. This helps reduce coughing.  Rest as needed.  Stop smoking if you smoke. GET HELP RIGHT AWAY IF:  You have yellowish-white fluid (pus) in your thick spit.  Your cough gets worse.  Your medicine does not reduce coughing, and you are losing sleep.  You cough up blood.  You have trouble breathing.  Your pain gets worse and medicine does not help.  You have a fever. MAKE SURE YOU:   Understand these instructions.  Will watch your condition.  Will get help right away if you are not doing well or get worse. Document Released: 04/11/2011 Document Revised: 12/13/2013 Document Reviewed: 04/11/2011 [facility] Patient Information 2015 [place], [place]. This information is not intended to replace advice given to you by your health care provider. Make sure you discuss any questions you have with your health care provider.  PRIMARY CARE Merchant navy officerDOCTORS [facility] [address]  [place], [place] [phone]   Fax [phone]  Nature conservation [facility] at [facility] [address]  [place], [place] [phone]  Fax [phone]  Nature conservation officer[facility] at [place] / [place] [phone]  [place], [place] [phone]  Fax [phone]  [facility] [address]  [place], [place] [phone]  Fax [phone]  [facility] [address] [place]  [place], [place] [phone]  Fax [phone]  [facility] [address] [place]  [place], [place] [phone]  Fax [phone]   [facility] @ [place] [address] [place] [address] Phone: [phone]   [facility] @ [facility] [address] [place] [address] Phone: [phone]   [facility] @ [place] [address] [place] [address] Phone: [phone]   [facility] @ [place] [address] [place] [address] Phone: [phone]   [facility] @ [place] [address] [place] [address] Phone: [phone] Fax: [phone]   [facility] @ [place] [address] [place] [address] Phone: [phone]   Dr. Maryelizabeth RowanElizabeth Dewey [address] [place] [address] [phone]

## 2014-10-04 ENCOUNTER — Encounter: Payer: Self-pay | Admitting: Neurology

## 2014-10-04 ENCOUNTER — Ambulatory Visit (INDEPENDENT_AMBULATORY_CARE_PROVIDER_SITE_OTHER): Payer: Medicare PPO | Admitting: Neurology

## 2014-10-04 VITALS — BP 140/86 | HR 92 | Ht 70.0 in | Wt 166.8 lb

## 2014-10-04 DIAGNOSIS — R413 Other amnesia: Secondary | ICD-10-CM

## 2014-10-04 DIAGNOSIS — S0990XS Unspecified injury of head, sequela: Secondary | ICD-10-CM

## 2014-10-04 DIAGNOSIS — G43719 Chronic migraine without aura, intractable, without status migrainosus: Secondary | ICD-10-CM

## 2014-10-04 MED ORDER — DONEPEZIL HCL 5 MG PO TABS: 5.0000 mg | ORAL_TABLET | Freq: Every day | ORAL | Status: DC

## 2014-10-04 NOTE — Patient Instructions (Addendum)
Begin Aricept (donepezil) at 5 mg at night for one month. If this medication is well-tolerated, please call our office and we will call in a prescription for the 10 mg tablets. Look out for side effects that may include nausea, diarrhea, weight loss, or stomach cramps. This medication will also cause a runny nose, therefore there is no need for allergy medications for this purpose.  Migraine Headache A migraine headache is an intense, throbbing pain on one or both sides of your head. A migraine can last for 30 minutes to several hours. CAUSES  The exact cause of a migraine headache is not always known. However, a migraine may be caused when nerves in the brain become irritated and release chemicals that cause inflammation. This causes pain. Certain things may also trigger migraines, such as:  Alcohol.  Smoking.  Stress.  Menstruation.  Aged cheeses.  Foods or drinks that contain nitrates, glutamate, aspartame, or tyramine.  Lack of sleep.  Chocolate.  Caffeine.  Hunger.  Physical exertion.  Fatigue.  Medicines used to treat chest pain (nitroglycerine), birth control pills, estrogen, and some blood pressure medicines. SIGNS AND SYMPTOMS  Pain on one or both sides of your head.  Pulsating or throbbing pain.  Severe pain that prevents daily activities.  Pain that is aggravated by any physical activity.  Nausea, vomiting, or both.  Dizziness.  Pain with exposure to bright lights, loud noises, or activity.  General sensitivity to bright lights, loud noises, or smells. Before you get a migraine, you may get warning signs that a migraine is coming (aura). An aura may include:  Seeing flashing lights.  Seeing bright spots, halos, or zigzag lines.  Having tunnel vision or blurred vision.  Having feelings of numbness or tingling.  Having trouble talking.  Having muscle weakness. DIAGNOSIS  A migraine headache is often diagnosed based  on:  Symptoms.  Physical exam.  A CT scan or MRI of your head. These imaging tests cannot diagnose migraines, but they can help rule out other causes of headaches. TREATMENT Medicines may be given for pain and nausea. Medicines can also be given to help prevent recurrent migraines.  HOME CARE INSTRUCTIONS  Only take over-the-counter or prescription medicines for pain or discomfort as directed by your health care provider. The use of long-term narcotics is not recommended.  Lie down in a dark, quiet room when you have a migraine.  Keep a journal to find out what may trigger your migraine headaches. For example, write down:  What you eat and drink.  How much sleep you get.  Any change to your diet or medicines.  Limit alcohol consumption.  Quit smoking if you smoke.  Get 7-9 hours of sleep, or as recommended by your health care provider.  Limit stress.  Keep lights dim if bright lights bother you and make your migraines worse. SEEK IMMEDIATE MEDICAL CARE IF:   Your migraine becomes severe.  You have a fever.  You have a stiff neck.  You have vision loss.  You have muscular weakness or loss of muscle control.  You start losing your balance or have trouble walking.  You feel faint or pass out.  You have severe symptoms that are different from your first symptoms. MAKE SURE YOU:   Understand these instructions.  Will watch your condition.  Will get help right away if you are not doing well or get worse. Document Released: 07/29/2005 Document Revised: 12/13/2013 Document Reviewed: 04/05/2013 [facility] Patient Information 2015 [place]ExitCare, [place]LL[place]. This information  is not intended to replace advice given to you by your health care provider. Make sure you discuss any questions you have with your health care provider.  

## 2014-10-04 NOTE — Progress Notes (Signed)
Reason for visit: Memory disturbance  TERRAN KLINKE is an 54 y.o. male  History of present illness:  Mr. Sproles is a 54 year old right-handed black male with a history of a closed head injury in the past. The patient has had issues with migraine headaches that are occurring 2 or 3 times a week. The patient will take Tylenol for the headache, and he indicates the headache only last about 20 minutes. The patient indicates that the headaches are not disabling to him at this time. The patient was on Depakote for the headaches, but he has stopped the medication at some point within the last year, he does not recall anything about the medication. He has had a progressive memory disorder as well. Blood work done last summer was unremarkable. The patient is on disability, but he is not making enough money to survive. The patient is unable to maintain any employment, not even a part-time job. He does not describe significant mood swings at this point. He returns this office for an evaluation.  Past Medical History  Diagnosis Date  . Bladder disease   . Environmental allergies   . History of headache   . Asthma   . Benign enlargement of prostate   . 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 Memory change 01/25/2013  . Restless legs syndrome (RLS) 01/14/2014    Past Surgical History  Procedure Laterality Date  . Abdominal surgery    . Brain surgery      Family History  Problem Relation Age of Onset  . Lupus Mother   . Arthritis/Rheumatoid Mother   . Hypertension Father   . Heart disease Father   . Sarcoidosis Sister   . Cancer Maternal Grandmother   . Heart disease Maternal Grandfather   . Cancer - Prostate Paternal Grandfather     Social history:  reports that he has never smoked. He has never used smokeless tobacco. He reports that he drinks alcohol. He reports that he does not use illicit drugs.   No Known Allergies  Medications:  Current Outpatient  Prescriptions on File Prior to Visit  Medication Sig Dispense Refill  . acetaminophen (TYLENOL) 325 MG tablet Take 650 mg by mouth every 6 (six) hours as needed.    Marland Kitchen albuterol (PROVENTIL HFA;VENTOLIN HFA) 108 (90 BASE) MCG/ACT inhaler Inhale 2 puffs into the lungs every 6 (six) hours as needed for wheezing or shortness of breath. 1 Inhaler 2  . cetirizine (ZYRTEC) 10 MG tablet Take 10 mg by mouth daily.    Marland Kitchen guaiFENesin-codeine 100-10 MG/5ML syrup Take 5 mLs by mouth at bedtime as needed for cough. 120 mL 0  . ipratropium (ATROVENT) 0.06 % nasal spray Place 2 sprays into both nostrils 4 (four) times daily. 15 mL 1  . omeprazole (PRILOSEC) 40 MG capsule Take 1 capsule (40 mg total) by mouth daily. 30 capsule 1  . Tamsulosin HCl (FLOMAX) 0.4 MG CAPS Take 0.4 mg by mouth daily.     . fluticasone (FLONASE) 50 MCG/ACT nasal spray Place 1 spray into the nose 2 (two) times daily. 1 g 2  . [DISCONTINUED] mirtazapine (REMERON) 30 MG tablet Take 1 tablet (30 mg total) by mouth at bedtime. 90 tablet 0  . [DISCONTINUED] solifenacin (VESICARE) 5 MG tablet Take 10 mg by mouth daily.      . [DISCONTINUED] zolmitriptan (ZOMIG) 5 MG tablet Take 1 tablet (5 mg total) by mouth 2 (two) times daily as  needed for migraine. 10 tablet 5   No current facility-administered medications on file prior to visit.    ROS:  Out of a complete 14 system review of symptoms, the patient complains only of the following symptoms, and all other reviewed systems are negative.  Environmental allergies Headache Agitation, behavior problems, confusion, depression  Blood pressure 140/86, pulse 92, height 5\' 10"  (1.778 m), weight 166 lb 12.8 oz (75.66 kg).  Physical Exam  General: The patient is alert and cooperative at the time of the examination.  Skin: No significant peripheral edema is noted.   Neurologic Exam  Mental status: The Mini-Mental Status Examination done today shows a total score 24/30.  Cranial nerves:  Facial symmetry is present. Speech is normal, no aphasia or dysarthria is noted. Extraocular movements are full. Visual fields are full.  Motor: The patient has good strength in all 4 extremities, with exception of some mild weakness of the intrinsic muscles of the right hand.  Sensory examination: Soft touch sensation is symmetric on the face, arms, and legs.  Coordination: The patient has good finger-nose-finger and heel-to-shin bilaterally.  Gait and station: The patient has a normal gait. Tandem gait is normal. Romberg is negative. No drift is seen.  Reflexes: Deep tendon reflexes are symmetric.   Assessment/Plan:  1. History of closed head injury  2. Progressive memory disorder  3. Migraine headache  4. History depression  The patient is having some progression of memory. He will be placed on Aricept at this time, starting on a 5 mg tablet. If he is tolerating this after one month, he will contact our office, and we will increase the medication to 10 mg at night. The patient may require a daily prophylactic medication for migraine in the future, we will not start this at this time. He was on Depakote previously, it is not clear when he actually stopped the medication or why he stopped the medication. He will follow-up in 6 months. The memory issue will need to be followed. The patient may need to go to the social security disability office to see if the disability payments can be increased.  Marlan Palau. Daniela Breandan People MD 10/04/2014 9:03 PM  [facility] [address] [place], [place] [address]  Phone [phone] Fax [phone]

## 2014-10-05 NOTE — Progress Notes (Signed)
[name]  

## 2014-10-06 ENCOUNTER — Encounter: Payer: Medicare PPO | Admitting: Cardiovascular Disease

## 2014-10-07 NOTE — Progress Notes (Signed)
Patient ID: Travis Bryant, male   DOB: 1961/05/28, 54 y.o.   MRN: [id number] erron

## 2014-10-12 ENCOUNTER — Encounter: Payer: Self-pay | Admitting: Cardiovascular Disease

## 2014-10-12 ENCOUNTER — Ambulatory Visit (INDEPENDENT_AMBULATORY_CARE_PROVIDER_SITE_OTHER): Payer: Medicare PPO | Admitting: Cardiovascular Disease

## 2014-10-12 ENCOUNTER — Ambulatory Visit (HOSPITAL_COMMUNITY)
Admission: RE | Admit: 2014-10-12 | Discharge: 2014-10-12 | Disposition: A | Discharge status: Home / Self Care | Payer: Medicare PPO | Source: Ambulatory Visit | Attending: Cardiology | Admitting: Cardiology

## 2014-10-12 VITALS — BP 142/98 | HR 89 | Ht 70.0 in | Wt 162.0 lb

## 2014-10-12 DIAGNOSIS — R0789 Other chest pain: Secondary | ICD-10-CM

## 2014-10-12 DIAGNOSIS — R079 Chest pain, unspecified: Secondary | ICD-10-CM | POA: Diagnosis not present

## 2014-10-12 DIAGNOSIS — R0602 Shortness of breath: Secondary | ICD-10-CM | POA: Diagnosis not present

## 2014-10-12 DIAGNOSIS — R0600 Dyspnea, unspecified: Secondary | ICD-10-CM | POA: Diagnosis not present

## 2014-10-12 NOTE — Patient Instructions (Signed)
Your physician recommends that you schedule a follow-up appointment in:  About 8 weeks.   Your physician has requested that you have an exercise tolerance test. For further information please visit [email or web address] Please also follow instruction sheet, as given.   Your physician has requested that you have an echocardiogram. Echocardiography is a painless test that uses sound waves to create images of your heart. It provides your doctor with information about the size and shape of your heart and how well your heart's chambers and valves are working. This procedure takes approximately one hour. There are no restrictions for this procedure.

## 2014-10-12 NOTE — Progress Notes (Signed)
History of Present Illness: 54 yo male with history of HTN, asthma, remote tobacco abuse, enlarged prostate here today as a self referral for evaluation of chest pain and SOB. He tells me that three weeks ago he woke up with pain in his chest associated with SOB. This lasted for several minutes. He has had no exertional chest pain. He denies any lower extremity edema, PND, orthopnea. He is very active.   Primary Care Physician: None  Past Medical History  Diagnosis Date  . Bladder disease   . Environmental allergies   . History of headache   . Asthma   . Benign enlargement of prostate   . 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 Memory change 01/25/2013  . Restless legs syndrome (RLS) 01/14/2014  . Hematuria   . HIV exposure   . HTN (hypertension)     Past Surgical History  Procedure Laterality Date  . Abdominal surgery    . Brain surgery      Current Outpatient Prescriptions  Medication Sig Dispense Refill  . acetaminophen (TYLENOL) 325 MG tablet Take 650 mg by mouth every 6 (six) hours as needed.    Marland Kitchen. albuterol (PROVENTIL HFA;VENTOLIN HFA) 108 (90 BASE) MCG/ACT inhaler Inhale 2 puffs into the lungs every 6 (six) hours as needed for wheezing or shortness of breath. 1 Inhaler 2  . cetirizine (ZYRTEC) 10 MG tablet Take 10 mg by mouth daily.    Marland Kitchen. donepezil (ARICEPT) 5 MG tablet Take 1 tablet (5 mg total) by mouth at bedtime. 30 tablet 1  . guaiFENesin-codeine 100-10 MG/5ML syrup Take 5 mLs by mouth at bedtime as needed for cough. 120 mL 0  . ipratropium (ATROVENT) 0.06 % nasal spray Place 2 sprays into both nostrils 4 (four) times daily. 15 mL 1  . mirabegron ER (MYRBETRIQ) 25 MG TB24 tablet Take 25 mg by mouth daily.    Marland Kitchen. omeprazole (PRILOSEC) 40 MG capsule Take 1 capsule (40 mg total) by mouth daily. 30 capsule 1  . Tamsulosin HCl (FLOMAX) 0.4 MG CAPS Take 0.4 mg by mouth daily.     . fluticasone (FLONASE) 50 MCG/ACT nasal spray Place 1 spray into  the nose 2 (two) times daily. 1 g 2  . [DISCONTINUED] mirtazapine (REMERON) 30 MG tablet Take 1 tablet (30 mg total) by mouth at bedtime. 90 tablet 0  . [DISCONTINUED] solifenacin (VESICARE) 5 MG tablet Take 10 mg by mouth daily.      . [DISCONTINUED] zolmitriptan (ZOMIG) 5 MG tablet Take 1 tablet (5 mg total) by mouth 2 (two) times daily as needed for migraine. 10 tablet 5   No current facility-administered medications for this visit.    No Known Allergies  History   Social History  . Marital Status: Single    Spouse Name: N/A  . Number of Children: 0  . Years of Education: college   Occupational History  . Teacher-part time [employer]   Social History Main Topics  . Smoking status: Former Smoker -- 0.10 packs/day for 20 years    Types: Cigarettes    Quit date: 10/11/2012  . Smokeless tobacco: Never Used  . Alcohol Use: 0.0 oz/week    0 Standard drinks or equivalent per week     Comment: socially  . Drug Use: No  . Sexual Activity: Not on file   Other Topics Concern  . Not on file   Social History Narrative   Patient  is left handed.   Patient drinks 3 glasses caffeine daily    Family History  Problem Relation Age of Onset  . Lupus Mother   . Arthritis/Rheumatoid Mother   . Hypertension Father   . Heart disease Father   . Sarcoidosis Sister   . Cancer Maternal Grandmother   . Heart disease Maternal Grandfather   . Cancer - Prostate Paternal Grandfather     Review of Systems:  As stated in the HPI and otherwise negative.   BP 142/98 mmHg  Pulse 89  Ht  (1.778 m)  Wt 162 lb (73.483 kg)  BMI 23.24 kg/m2  Physical Examination: General: Well developed, well nourished, NAD HEENT: OP clear, mucus membranes moist SKIN: warm, dry. No rashes. Neuro: No focal deficits Musculoskeletal: Muscle strength 5/5 all ext Psychiatric: Mood and affect normal Neck: No JVD, no carotid bruits, no thyromegaly, no lymphadenopathy. Lungs:Clear bilaterally,  no wheezes, rhonci, crackles Cardiovascular: Regular rate and rhythm. No murmurs, gallops or rubs. Abdomen:Soft. Bowel sounds present. Non-tender.  Extremities: No lower extremity edema. Pulses are 2 + in the bilateral DP/PT.  EKG: NSR, rate 89 bpm. Non-specific T wave abnormalities.   Assessment and Plan:   1. Chest pain/Dyspnea: He has risk factors for CAD including former tobacco use, HTN, FH of CAD. Chest pain is atypical but given his risk factors, will arrange exercise stress test to exclude ischemia. Will arrange echo to assess LV systolic function and exclude structural heart disease.   2. Tobacco use: He only smokes occasionally with friends. I have recommended complete tobacco cessation.

## 2014-10-12 NOTE — Procedures (Signed)
Exercise Treadmill Test    Test  Exercise Tolerance Test Ordering MD: Melene Mullerhristopher McAlhaney, MD    Unique Test No: 1  Treadmill:  1  Indication for ETT: chest pain - rule out ischemia  Contraindication to ETT: No   Stress Modality: exercise - treadmill  Cardiac Imaging Performed: non   Protocol: standard Bruce - maximal  Max BP:  208/68  Max MPHR (bpm):  166 85% MPR (bpm):  141  MPHR obtained (bpm):  150 % MPHR obtained:  90  Reached 85% MPHR (min:sec):  4:10 Total Exercise Time (min-sec):  5  Workload in METS:  7.0 Borg Scale: 15  Reason ETT Terminated:  SOB and Leg Fatigue    ST Segment Analysis At Rest: normal ST segments - no evidence of significant ST depression With Exercise: no evidence of significant ST depression  Other Information Arrhythmia:  No Angina during ETT:  absent (0) Quality of ETT:  diagnostic  ETT Interpretation:  normal - no evidence of ischemia by ST analysis  Comments: LVH by voltage No ischemic EKG changes Fair exercise tolerance Brief period of "chest tightness" Hypertensive BP response to exercise  Chrystie NoseKenneth C. Kathlee Barnhardt, MD, [facility] Attending Cardiologist [facility]

## 2014-10-17 ENCOUNTER — Ambulatory Visit (HOSPITAL_COMMUNITY): Payer: Medicare PPO | Attending: Internal Medicine

## 2014-10-17 DIAGNOSIS — R0600 Dyspnea, unspecified: Secondary | ICD-10-CM | POA: Diagnosis not present

## 2014-10-17 DIAGNOSIS — R0789 Other chest pain: Secondary | ICD-10-CM | POA: Insufficient documentation

## 2014-10-17 NOTE — Progress Notes (Signed)
2D Echo completed. 10/17/2014 

## 2014-11-03 ENCOUNTER — Ambulatory Visit: Payer: Medicare PPO | Admitting: Cardiology
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
## 2014-12-09 ENCOUNTER — Ambulatory Visit (INDEPENDENT_AMBULATORY_CARE_PROVIDER_SITE_OTHER): Payer: Medicare PPO | Admitting: Cardiovascular Disease

## 2014-12-09 ENCOUNTER — Encounter: Payer: Self-pay | Admitting: Cardiovascular Disease

## 2014-12-09 VITALS — BP 134/84 | HR 95 | Ht 70.0 in | Wt 160.1 lb

## 2014-12-09 DIAGNOSIS — R0789 Other chest pain: Secondary | ICD-10-CM | POA: Diagnosis not present

## 2014-12-09 DIAGNOSIS — Z720 Tobacco use: Secondary | ICD-10-CM

## 2014-12-09 NOTE — Patient Instructions (Signed)
Your physician recommends that you schedule a follow-up appointment as needed with Dr. McAlhany   

## 2015-01-02 ENCOUNTER — Encounter (HOSPITAL_COMMUNITY): Payer: Self-pay | Admitting: Family Medicine

## 2015-01-02 ENCOUNTER — Emergency Department (INDEPENDENT_AMBULATORY_CARE_PROVIDER_SITE_OTHER)
Admission: EM | Admit: 2015-01-02 | Discharge: 2015-01-02 | Disposition: A | Discharge status: Home / Self Care | Payer: Medicare PPO | Source: Home / Self Care | Attending: Family Medicine | Admitting: Family Medicine

## 2015-01-02 DIAGNOSIS — S46011A Strain of muscle(s) and tendon(s) of the rotator cuff of right shoulder, initial encounter: Secondary | ICD-10-CM

## 2015-01-02 DIAGNOSIS — M25511 Pain in right shoulder: Secondary | ICD-10-CM

## 2015-01-02 DIAGNOSIS — S46811A Strain of other muscles, fascia and tendons at shoulder and upper arm level, right arm, initial encounter: Secondary | ICD-10-CM

## 2015-01-02 MED ORDER — DICLOFENAC SODIUM 75 MG PO TBEC: 75.0000 mg | DELAYED_RELEASE_TABLET | Freq: Two times a day (BID) | ORAL | Status: DC

## 2015-01-02 MED ORDER — METHOCARBAMOL 500 MG PO TABS: 500.0000 mg | ORAL_TABLET | Freq: Four times a day (QID) | ORAL | Status: DC | PRN
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
## 2015-01-02 NOTE — Discharge Instructions (Signed)
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 He may also have some mild underlying rotator cuff strain as well as some spasm of your trapezius muscle. Please set using the Voltaren medication as well as Robaxin for muscle strain and spasm. Robaxin may make you sleepy. Please perform her exercises and follow-up with her primary care physician in 1-2 weeks if you not better.  Impingement Syndrome, Rotator Cuff, Bursitis with Rehab Impingement syndrome is a condition that involves inflammation of the tendons of the rotator cuff and the subacromial bursa, that causes pain in the shoulder. The rotator cuff consists of four tendons and muscles that control much of the shoulder and upper arm function. The subacromial bursa is a fluid filled sac that helps reduce friction between the rotator cuff and one of the bones of the shoulder (acromion). Impingement syndrome is usually an overuse injury that causes swelling of the bursa (bursitis), swelling of the tendon (tendonitis), and/or a tear of the tendon (strain). Strains are classified into three categories. Grade 1 strains cause pain, but the tendon is not lengthened. Grade 2 strains include a lengthened ligament, due to the ligament being stretched or partially ruptured. With grade 2 strains there is still function, although the function may be decreased. Grade 3 strains include a complete tear of the tendon or muscle, and function is usually impaired. SYMPTOMS   Pain around the shoulder, often at the outer portion of the upper arm.  Pain that gets worse with shoulder function, especially when reaching overhead or lifting.  Sometimes, aching when not using the arm.  Pain that wakes you up at night.  Sometimes, tenderness, swelling, warmth, or redness over the affected area.  Loss of strength.  Limited motion of the shoulder, especially reaching behind the back (to the back pocket or to unhook bra) or across your  body.  Crackling sound (crepitation) when moving the arm.  Biceps tendon pain and inflammation (in the front of the shoulder). Worse when bending the elbow or lifting. CAUSES  Impingement syndrome is often an overuse injury, in which chronic (repetitive) motions cause the tendons or bursa to become inflamed. A strain occurs when a force is paced on the tendon or muscle that is greater than it can withstand. Common mechanisms of injury include: Stress from sudden increase in duration, frequency, or intensity of training.  Direct hit (trauma) to the shoulder.  Aging, erosion of the tendon with normal use.  Bony bump on shoulder (acromial spur). RISK INCREASES WITH:  Contact sports (football, wrestling, boxing).  Throwing sports (baseball, tennis, volleyball).  Weightlifting and bodybuilding.  Heavy labor.  Previous injury to the rotator cuff, including impingement.  Poor shoulder strength and flexibility.  Failure to warm up properly before activity.  Inadequate protective equipment.  Old age.  Bony bump on shoulder (acromial spur). PREVENTION   Warm up and stretch properly before activity.  Allow for adequate recovery between workouts.  Maintain physical fitness:  Strength, flexibility, and endurance.  Cardiovascular fitness.  Learn and use proper exercise technique. PROGNOSIS  If treated properly, impingement syndrome usually goes away within 6 weeks. Sometimes surgery is required.  RELATED COMPLICATIONS   Longer healing time if not properly treated, or if not given enough time to heal.  Recurring symptoms, that result in a chronic condition.  Shoulder stiffness, frozen shoulder, or loss of motion.  Rotator cuff tendon tear.  Recurring symptoms, especially if activity is resumed too soon, with overuse, with a  direct blow, or when using poor technique. TREATMENT  Treatment first involves the use of ice and medicine, to reduce pain and inflammation. The use  of strengthening and stretching exercises may help reduce pain with activity. These exercises may be performed at home or with a therapist. If non-surgical treatment is unsuccessful after more than 6 months, surgery may be advised. After surgery and rehabilitation, activity is usually possible in 3 months.  MEDICATION  If pain medicine is needed, nonsteroidal anti-inflammatory medicines (aspirin and ibuprofen), or other minor pain relievers (acetaminophen), are often advised.  Do not take pain medicine for 7 days before surgery.  Prescription pain relievers may be given, if your caregiver thinks they are needed. Use only as directed and only as much as you need.  Corticosteroid injections may be given by your caregiver. These injections should be reserved for the most serious cases, because they may only be given a certain number of times. HEAT AND COLD  Cold treatment (icing) should be applied for 10 to 15 minutes every 2 to 3 hours for inflammation and pain, and immediately after activity that aggravates your symptoms. Use ice packs or an ice massage.  Heat treatment may be used before performing stretching and strengthening activities prescribed by your caregiver, physical therapist, or athletic trainer. Use a heat pack or a warm water soak. SEEK MEDICAL CARE IF:   Symptoms get worse or do not improve in 4 to 6 weeks, despite treatment.  New, unexplained symptoms develop. (Drugs used in treatment may produce side effects.) EXERCISES  RANGE OF MOTION (ROM) AND STRETCHING EXERCISES - Impingement Syndrome (Rotator Cuff  Tendinitis, Bursitis) These exercises may help you when beginning to rehabilitate your injury. Your symptoms may go away with or without further involvement from your physician, physical therapist or athletic trainer. While completing these exercises, remember:   Restoring tissue flexibility helps normal motion to return to the joints. This allows healthier, less painful  movement and activity.  An effective stretch should be held for at least 30 seconds.  A stretch should never be painful. You should only feel a gentle lengthening or release in the stretched tissue. STRETCH - Flexion, Standing  Stand with good posture. With an underhand grip on your right / left hand, and an overhand grip on the opposite hand, grasp a broomstick or cane so that your hands are a little more than shoulder width apart.  Keeping your right / left elbow straight and shoulder muscles relaxed, push the stick with your opposite hand, to raise your right / left arm in front of your body and then overhead. Raise your arm until you feel a stretch in your right / left shoulder, but before you have increased shoulder pain.  Try to avoid shrugging your right / left shoulder as your arm rises, by keeping your shoulder blade tucked down and toward your mid-back spine. Hold for __________ seconds.  Slowly return to the starting position. Repeat __________ times. Complete this exercise __________ times per day. STRETCH - Abduction, Supine  Lie on your back. With an underhand grip on your right / left hand and an overhand grip on the opposite hand, grasp a broomstick or cane so that your hands are a little more than shoulder width apart.  Keeping your right / left elbow straight and your shoulder muscles relaxed, push the stick with your opposite hand, to raise your right / left arm out to the side of your body and then overhead. Raise your arm until  you feel a stretch in your right / left shoulder, but before you have increased shoulder pain.  Try to avoid shrugging your right / left shoulder as your arm rises, by keeping your shoulder blade tucked down and toward your mid-back spine. Hold for __________ seconds.  Slowly return to the starting position. Repeat __________ times. Complete this exercise __________ times per day. ROM - Flexion, Active-Assisted  Lie on your back. You may bend  your knees for comfort.  Grasp a broomstick or cane so your hands are about shoulder width apart. Your right / left hand should grip the end of the stick, so that your hand is positioned "thumbs-up," as if you were about to shake hands.  Using your healthy arm to lead, raise your right / left arm overhead, until you feel a gentle stretch in your shoulder. Hold for __________ seconds.  Use the stick to assist in returning your right / left arm to its starting position. Repeat __________ times. Complete this exercise __________ times per day.  ROM - Internal Rotation, Supine   Lie on your back on a firm surface. Place your right / left elbow about 60 degrees away from your side. Elevate your elbow with a folded towel, so that the elbow and shoulder are the same height.  Using a broomstick or cane and your strong arm, pull your right / left hand toward your body until you feel a gentle stretch, but no increase in your shoulder pain. Keep your shoulder and elbow in place throughout the exercise.  Hold for __________ seconds. Slowly return to the starting position. Repeat __________ times. Complete this exercise __________ times per day. STRETCH - Internal Rotation  Place your right / left hand behind your back, palm up.  Throw a towel or belt over your opposite shoulder. Grasp the towel with your right / left hand.  While keeping an upright posture, gently pull up on the towel, until you feel a stretch in the front of your right / left shoulder.  Avoid shrugging your right / left shoulder as your arm rises, by keeping your shoulder blade tucked down and toward your mid-back spine.  Hold for __________ seconds. Release the stretch, by lowering your healthy hand. Repeat __________ times. Complete this exercise __________ times per day. ROM - Internal Rotation   Using an underhand grip, grasp a stick behind your back with both hands.  While standing upright with good posture, slide the stick  up your back until you feel a mild stretch in the front of your shoulder.  Hold for __________ seconds. Slowly return to your starting position. Repeat __________ times. Complete this exercise __________ times per day.  STRETCH - Posterior Shoulder Capsule   Stand or sit with good posture. Grasp your right / left elbow and draw it across your chest, keeping it at the same height as your shoulder.  Pull your elbow, so your upper arm comes in closer to your chest. Pull until you feel a gentle stretch in the back of your shoulder.  Hold for __________ seconds. Repeat __________ times. Complete this exercise __________ times per day. STRENGTHENING EXERCISES - Impingement Syndrome (Rotator Cuff Tendinitis, Bursitis) These exercises may help you when beginning to rehabilitate your injury. They may resolve your symptoms with or without further involvement from your physician, physical therapist or athletic trainer. While completing these exercises, remember:  Muscles can gain both the endurance and the strength needed for everyday activities through controlled exercises.  Complete these exercises  as instructed by your physician, physical therapist or athletic trainer. Increase the resistance and repetitions only as guided.  You may experience muscle soreness or fatigue, but the pain or discomfort you are trying to eliminate should never worsen during these exercises. If this pain does get worse, stop and make sure you are following the directions exactly. If the pain is still present after adjustments, discontinue the exercise until you can discuss the trouble with your clinician.  During your recovery, avoid activity or exercises which involve actions that place your injured hand or elbow above your head or behind your back or head. These positions stress the tissues which you are trying to heal. STRENGTH - Scapular Depression and Adduction   With good posture, sit on a firm chair. Support your  arms in front of you, with pillows, arm rests, or on a table top. Have your elbows in line with the sides of your body.  Gently draw your shoulder blades down and toward your mid-back spine. Gradually increase the tension, without tensing the muscles along the top of your shoulders and the back of your neck.  Hold for __________ seconds. Slowly release the tension and relax your muscles completely before starting the next repetition.  After you have practiced this exercise, remove the arm support and complete the exercise in standing as well as sitting position. Repeat __________ times. Complete this exercise __________ times per day.  STRENGTH - Shoulder Abductors, Isometric  With good posture, stand or sit about 4-6 inches from a wall, with your right / left side facing the wall.  Bend your right / left elbow. Gently press your right / left elbow into the wall. Increase the pressure gradually, until you are pressing as hard as you can, without shrugging your shoulder or increasing any shoulder discomfort.  Hold for __________ seconds.  Release the tension slowly. Relax your shoulder muscles completely before you begin the next repetition. Repeat __________ times. Complete this exercise __________ times per day.  STRENGTH - External Rotators, Isometric  Keep your right / left elbow at your side and bend it 90 degrees.  Step into a door frame so that the outside of your right / left wrist can press against the door frame without your upper arm leaving your side.  Gently press your right / left wrist into the door frame, as if you were trying to swing the back of your hand away from your stomach. Gradually increase the tension, until you are pressing as hard as you can, without shrugging your shoulder or increasing any shoulder discomfort.  Hold for __________ seconds.  Release the tension slowly. Relax your shoulder muscles completely before you begin the next repetition. Repeat  __________ times. Complete this exercise __________ times per day.  STRENGTH - Supraspinatus   Stand or sit with good posture. Grasp a __________ weight, or an exercise band or tubing, so that your hand is "thumbs-up," like you are shaking hands.  Slowly lift your right / left arm in a "V" away from your thigh, diagonally into the space between your side and straight ahead. Lift your hand to shoulder height or as far as you can, without increasing any shoulder pain. At first, many people do not lift their hands above shoulder height.  Avoid shrugging your right / left shoulder as your arm rises, by keeping your shoulder blade tucked down and toward your mid-back spine.  Hold for __________ seconds. Control the descent of your hand, as you slowly return to  your starting position. Repeat __________ times. Complete this exercise __________ times per day.  STRENGTH - External Rotators  Secure a rubber exercise band or tubing to a fixed object (table, pole) so that it is at the same height as your right / left elbow when you are standing or sitting on a firm surface.  Stand or sit so that the secured exercise band is at your uninjured side.  Bend your right / left elbow 90 degrees. Place a folded towel or small pillow under your right / left arm, so that your elbow is a few inches away from your side.  Keeping the tension on the exercise band, pull it away from your body, as if pivoting on your elbow. Be sure to keep your body steady, so that the movement is coming only from your rotating shoulder.  Hold for __________ seconds. Release the tension in a controlled manner, as you return to the starting position. Repeat __________ times. Complete this exercise __________ times per day.  STRENGTH - Internal Rotators   Secure a rubber exercise band or tubing to a fixed object (table, pole) so that it is at the same height as your right / left elbow when you are standing or sitting on a firm  surface.  Stand or sit so that the secured exercise band is at your right / left side.  Bend your elbow 90 degrees. Place a folded towel or small pillow under your right / left arm so that your elbow is a few inches away from your side.  Keeping the tension on the exercise band, pull it across your body, toward your stomach. Be sure to keep your body steady, so that the movement is coming only from your rotating shoulder.  Hold for __________ seconds. Release the tension in a controlled manner, as you return to the starting position. Repeat __________ times. Complete this exercise __________ times per day.  STRENGTH - Scapular Protractors, Standing   Stand arms length away from a wall. Place your hands on the wall, keeping your elbows straight.  Begin by dropping your shoulder blades down and toward your mid-back spine.  To strengthen your protractors, keep your shoulder blades down, but slide them forward on your rib cage. It will feel as if you are lifting the back of your rib cage away from the wall. This is a subtle motion and can be challenging to complete. Ask your caregiver for further instruction, if you are not sure you are doing the exercise correctly.  Hold for __________ seconds. Slowly return to the starting position, resting the muscles completely before starting the next repetition. Repeat __________ times. Complete this exercise __________ times per day. STRENGTH - Scapular Protractors, Supine  Lie on your back on a firm surface. Extend your right / left arm straight into the air while holding a __________ weight in your hand.  Keeping your head and back in place, lift your shoulder off the floor.  Hold for __________ seconds. Slowly return to the starting position, and allow your muscles to relax completely before starting the next repetition. Repeat __________ times. Complete this exercise __________ times per day. STRENGTH - Scapular Protractors, Quadruped  Get onto  your hands and knees, with your shoulders directly over your hands (or as close as you can be, comfortably).  Keeping your elbows locked, lift the back of your rib cage up into your shoulder blades, so your mid-back rounds out. Keep your neck muscles relaxed.  Hold this position for __________  seconds. Slowly return to the starting position and allow your muscles to relax completely before starting the next repetition. Repeat __________ times. Complete this exercise __________ times per day.  STRENGTH - Scapular Retractors  Secure a rubber exercise band or tubing to a fixed object (table, pole), so that it is at the height of your shoulders when you are either standing, or sitting on a firm armless chair.  With a palm down grip, grasp an end of the band in each hand. Straighten your elbows and lift your hands straight in front of you, at shoulder height. Step back, away from the secured end of the band, until it becomes tense.  Squeezing your shoulder blades together, draw your elbows back toward your sides, as you bend them. Keep your upper arms lifted away from your body throughout the exercise.  Hold for __________ seconds. Slowly ease the tension on the band, as you reverse the directions and return to the starting position. Repeat __________ times. Complete this exercise __________ times per day. STRENGTH - Shoulder Extensors   Secure a rubber exercise band or tubing to a fixed object (table, pole) so that it is at the height of your shoulders when you are either standing, or sitting on a firm armless chair.  With a thumbs-up grip, grasp an end of the band in each hand. Straighten your elbows and lift your hands straight in front of you, at shoulder height. Step back, away from the secured end of the band, until it becomes tense.  Squeezing your shoulder blades together, pull your hands down to the sides of your thighs. Do not allow your hands to go behind you.  Hold for __________  seconds. Slowly ease the tension on the band, as you reverse the directions and return to the starting position. Repeat __________ times. Complete this exercise __________ times per day.  STRENGTH - Scapular Retractors and External Rotators   Secure a rubber exercise band or tubing to a fixed object (table, pole) so that it is at the height as your shoulders, when you are either standing, or sitting on a firm armless chair.  With a palm down grip, grasp an end of the band in each hand. Bend your elbows 90 degrees and lift your elbows to shoulder height, at your sides. Step back, away from the secured end of the band, until it becomes tense.  Squeezing your shoulder blades together, rotate your shoulders so that your upper arms and elbows remain stationary, but your fists travel upward to head height.  Hold for __________ seconds. Slowly ease the tension on the band, as you reverse the directions and return to the starting position. Repeat __________ times. Complete this exercise __________ times per day.  STRENGTH - Scapular Retractors and External Rotators, Rowing   Secure a rubber exercise band or tubing to a fixed object (table, pole) so that it is at the height of your shoulders, when you are either standing, or sitting on a firm armless chair.  With a palm down grip, grasp an end of the band in each hand. Straighten your elbows and lift your hands straight in front of you, at shoulder height. Step back, away from the secured end of the band, until it becomes tense.  Step 1: Squeeze your shoulder blades together. Bending your elbows, draw your hands to your chest, as if you are rowing a boat. At the end of this motion, your hands and elbow should be at shoulder height and your elbows should  be out to your sides.  Step 2: Rotate your shoulders, to raise your hands above your head. Your forearms should be vertical and your upper arms should be horizontal.  Hold for __________ seconds. Slowly  ease the tension on the band, as you reverse the directions and return to the starting position. Repeat __________ times. Complete this exercise __________ times per day.  STRENGTH - Scapular Depressors  Find a sturdy chair without wheels, such as a dining room chair.  Keeping your feet on the floor, and your hands on the chair arms, lift your bottom up from the seat, and lock your elbows.  Keeping your elbows straight, allow gravity to pull your body weight down. Your shoulders will rise toward your ears.  Raise your body against gravity by drawing your shoulder blades down your back, shortening the distance between your shoulders and ears. Although your feet should always maintain contact with the floor, your feet should progressively support less body weight, as you get stronger.  Hold for __________ seconds. In a controlled and slow manner, lower your body weight to begin the next repetition. Repeat __________ times. Complete this exercise __________ times per day.  Document Released: 07/29/2005 Document Revised: 10/21/2011 Document Reviewed: 11/10/2008 [facility] Patient Information 2015 [place], [place]. This information is not intended to replace advice given to you by your health care provider. Make sure you discuss any questions you have with your health care provider.

## 2015-01-30 ENCOUNTER — Ambulatory Visit: Payer: Medicare PPO | Admitting: Nurse Practitioner

## 2015-01-30 ENCOUNTER — Telehealth: Payer: Self-pay | Admitting: *Deleted

## 2015-01-30 NOTE — Telephone Encounter (Signed)
Pt did not show for appt today

## 2015-01-31 ENCOUNTER — Encounter: Payer: Self-pay | Admitting: Nurse Practitioner

## 2015-01-31 ENCOUNTER — Ambulatory Visit (INDEPENDENT_AMBULATORY_CARE_PROVIDER_SITE_OTHER): Payer: Medicare PPO | Admitting: Nurse Practitioner

## 2015-01-31 VITALS — BP 132/83 | HR 78 | Ht 70.0 in | Wt 162.0 lb

## 2015-01-31 DIAGNOSIS — G43719 Chronic migraine without aura, intractable, without status migrainosus: Secondary | ICD-10-CM | POA: Diagnosis not present

## 2015-01-31 DIAGNOSIS — R413 Other amnesia: Secondary | ICD-10-CM | POA: Diagnosis not present

## 2015-01-31 MED ORDER — DIVALPROEX SODIUM ER 500 MG PO TB24: 500.0000 mg | ORAL_TABLET | Freq: Every day | ORAL | Status: DC

## 2015-01-31 MED ORDER — DONEPEZIL HCL 10 MG PO TABS: 10.0000 mg | ORAL_TABLET | Freq: Every day | ORAL | Status: DC

## 2015-01-31 NOTE — Progress Notes (Signed)
[facility]  PATIENT: Travis Bryant DOB: [birth year]   REASON FOR VISIT: Follow-up for memory loss and headache, depression HISTORY FROM: Patient    HISTORY OF PRESENT ILLNESSMr. Bryant is a 54 year old right-handed black male with a history of a closed head injury in the past. He was last seen in the office 10/04/2014 by Travis Bryant The patient has had issues with migraine headaches that are occurring 2 or 3 times a week. The patient will take Tylenol for the headache, and he indicates the headache only last about 20 minutes. The patient indicates that the headaches are not disabling to him at this time. He has a mild headache today. He has been on Depakote in the past but he stopped the medication at some point in the last year  He has had a progressive memory disorder as well. He was placed on Aricept 5 mg at his last visit, he denies any side effects to the drug .Blood work done last summer was unremarkable for treatable causes of memory loss. The patient is on disability. The patient is unable to maintain any employment, not even a part-time job. He also complains of significant depression and his depression score is 10. He has been asked to see psychiatry in the past but he has never followed through. He returns to this office for an evaluation.  REVIEW OF SYSTEMS: Full 14 system review of systems performed and notable only for those listed, all others are neg:  Constitutional: neg  Cardiovascular: neg Ear/Nose/Throat: neg  Skin: neg Eyes: neg Respiratory: neg Gastroitestinal: Urinary frequency  Hematology/Lymphatic: neg  Endocrine: neg Musculoskeletal:neg Allergy/Immunology: neg Neurological: neg Psychiatric: Agitation, confusion, depression and anxiety Sleep : Frequent wakening   ALLERGIES: No Known Allergies  HOME MEDICATIONS: Outpatient Prescriptions Prior to Visit  Medication Sig Dispense Refill  . acetaminophen (TYLENOL) 325 MG tablet Take 650 mg by  mouth every 6 (six) hours as needed (for pain).     Marland Kitchen albuterol (PROVENTIL HFA;VENTOLIN HFA) 108 (90 BASE) MCG/ACT inhaler Inhale 2 puffs into the lungs every 6 (six) hours as needed for wheezing or shortness of breath. 1 Inhaler 2  . cetirizine (ZYRTEC) 10 MG tablet Take 10 mg by mouth daily.    . diclofenac (VOLTAREN) 75 MG EC tablet Take 1 tablet (75 mg total) by mouth 2 (two) times daily. 60 tablet 0  . donepezil (ARICEPT) 5 MG tablet Take 1 tablet (5 mg total) by mouth at bedtime. 30 tablet 1  . guaiFENesin-codeine 100-10 MG/5ML syrup Take 5 mLs by mouth at bedtime as needed for cough. 120 mL 0  . ipratropium (ATROVENT) 0.06 % nasal spray Place 2 sprays into both nostrils 4 (four) times daily. 15 mL 1  . methocarbamol (ROBAXIN) 500 MG tablet Take 1-2 tablets (500-1,000 mg total) by mouth every 6 (six) hours as needed for muscle spasms. 60 tablet 0  . mirabegron ER (MYRBETRIQ) 25 MG TB24 tablet Take 25 mg by mouth daily.    Marland Kitchen omeprazole (PRILOSEC) 40 MG capsule Take 1 capsule (40 mg total) by mouth daily. 30 capsule 1  . Tamsulosin HCl (FLOMAX) 0.4 MG CAPS Take 0.4 mg by mouth daily.     . fluticasone (FLONASE) 50 MCG/ACT nasal spray Place 1 spray into the nose 2 (two) times daily. 1 g 2  . amoxicillin (AMOXIL) 500 MG capsule Take 1 capsule by mouth daily.     No facility-administered medications prior to visit.    PAST MEDICAL HISTORY: Past Medical History  Diagnosis Date  . Bladder disease   . Environmental allergies   . History of headache   . Asthma   . Benign enlargement of prostate   . 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 Memory change 01/25/2013  . Restless legs syndrome (RLS) 01/14/2014  . Hematuria   . HIV exposure   . HTN (hypertension)     PAST SURGICAL HISTORY: Past Surgical History  Procedure Laterality Date  . Abdominal surgery    . Brain surgery      FAMILY HISTORY: Family History  Problem Relation Age of Onset  . Lupus Mother     . Arthritis/Rheumatoid Mother   . Hypertension Father   . Heart disease Father   . Sarcoidosis Sister   . Cancer Maternal Grandmother   . Heart disease Maternal Grandfather   . Cancer - Prostate Paternal Grandfather     SOCIAL HISTORY: History   Social History  . Marital Status: Single    Spouse Name: N/A  . Number of Children: 0  . Years of Education: college   Occupational History  . Teacher-part time [employer]   Social History Main Topics  . Smoking status: Former Smoker -- 0.10 packs/day for 20 years    Types: Cigarettes    Quit date: 10/11/2012  . Smokeless tobacco: Never Used  . Alcohol Use: 0.0 oz/week    0 Standard drinks or equivalent per week     Comment: socially  . Drug Use: No  . Sexual Activity: Not on file   Other Topics Concern  . Not on file   Social History Narrative   Patient is left handed.   Patient drinks 3 glasses caffeine daily     PHYSICAL EXAM  Filed Vitals:   01/31/15 0924  BP: 132/83  Pulse: 78  Height:  (1.778 m)  Weight: 162 lb (73.483 kg)   Body mass index is 23.24 kg/(m^2). General: The patient is alert and cooperative at the time of the examination. Neuromuscular: The patient has a slightly smaller right hand and forearm as compared to the left. Skin: No significant peripheral edema is noted.   Neurologic Exam  Mental status: The Mini-Mental status examination done today shows a total score of 22/30. Last was 24 out of 30. Depression scale 10 AFT 4. Follows all commands  Cranial nerves: Facial symmetry is present. Speech is normal, no aphasia or dysarthria is noted. Extraocular movements are full. Visual fields are full. Motor: The patient has good strength in all 4 extremities. No focal weakness Sensory examination: soft touch sensation is symmetric on the face, arms, and legs.  Coordination: The patient has good finger-nose-finger and heel-to-shin bilaterally. Gait and station: The patient has a  normal gait. Tandem gait is slightly unsteady. Romberg is negative. No drift is seen. Reflexes: Deep tendon reflexes are slightly elevated on the right arm and right leg relative to the left.   DIAGNOSTIC DATA (LABS, IMAGING, TESTING)    ASSESSMENT AND PLAN  54 y.o. year old male  has a past medical history of  History of closed head injury (1966); Memory change (01/25/2013); Restless legs syndrome (RLS) (01/14/2014);  HIV exposure; and HTN (hypertension) depression and  headaches here to follow-up.   Increase Aricept to 10 mg daily, will refill Restart Depakote 500 extended release daily at bedtime, this is for and migraine and will help depression, will refill Memory score is stable, will follow over  time Follow-up  in 6 months for repeat memory testing Nilda Riggs, [facility], [facility], APRN  [facility] [address] [place], [place] [address] [phone]

## 2015-01-31 NOTE — Patient Instructions (Signed)
Increase Aricept to 10 mg daily, will refill Restart Depakote 500 extended release daily at bedtime, this is for and migraine and will help depression, will refill Memory score is stable, will follow over  Time Follow-up in 6 months for repeat memory testing

## 2015-01-31 NOTE — Progress Notes (Signed)
I have read the note, and I agree with the clinical assessment and plan.  WILLIS,CHARLES Greysyn   

## 2015-02-16 ENCOUNTER — Telehealth: Payer: Self-pay | Admitting: Neurology

## 2015-02-16 NOTE — Telephone Encounter (Signed)
Patient dropped off a letter addressed to Dr. Anne HahnWillis a couple weeks ago regarding an increase in his disability benefits due to his memory problems. He would like a call back regarding this. Best call back is [phone] and it is okay to leave a message.

## 2015-02-16 NOTE — Telephone Encounter (Signed)
I called the patient. He recently saw Eber JonesCarolyn and states nothing has changed since then, but wanted to follow up on the letter he brought in. I apologized to him. I have not seen the letter. I promised him I would check with Dr. Anne HahnWillis to see if he has received it. I asked the patient what was in the letter and he stated he couldn't remember exactly. His sister wrote it. I asked him to check and see if she can make another copy of the letter in case we are not able to find it. He stated he would check on it and call back.

## 2015-02-20 NOTE — Telephone Encounter (Signed)
Patient brought a new copy of the letter. Letter is on Dr. Anne HahnWillis' desk.

## 2015-02-21 ENCOUNTER — Telehealth: Payer: Self-pay | Admitting: Neurology

## 2015-02-21 NOTE — Telephone Encounter (Signed)
I received a letter today from the sister of the patient, Selena BattenKim. She indicates that the patient has had increasing problems with cognitive processing, agitation, memory disorders. He is having problems maintaining gainful employment. They have requested an increased level of disability. The patient himself needs to initiate this, going through disability determination services. I will contact the patient concerning this.  I did call the patient, I left a message.

## 2015-04-05 ENCOUNTER — Ambulatory Visit: Payer: Medicare PPO | Admitting: Nurse Practitioner

## 2015-04-26 DIAGNOSIS — N529 Male erectile dysfunction, unspecified: Secondary | ICD-10-CM | POA: Insufficient documentation

## 2015-06-19 DIAGNOSIS — Z9109 Other allergy status, other than to drugs and biological substances: Secondary | ICD-10-CM | POA: Insufficient documentation

## 2015-06-19 DIAGNOSIS — J45909 Unspecified asthma, uncomplicated: Secondary | ICD-10-CM | POA: Insufficient documentation

## 2015-06-21 DIAGNOSIS — Z8611 Personal history of tuberculosis: Secondary | ICD-10-CM | POA: Insufficient documentation

## 2015-06-21 DIAGNOSIS — N400 Enlarged prostate without lower urinary tract symptoms: Secondary | ICD-10-CM | POA: Insufficient documentation

## 2015-07-25 ENCOUNTER — Ambulatory Visit: Payer: Medicare PPO | Admitting: Nurse Practitioner

## 2015-08-02 ENCOUNTER — Ambulatory Visit: Payer: Medicare PPO | Admitting: Nurse Practitioner

## 2015-08-10 ENCOUNTER — Ambulatory Visit: Payer: Medicare PPO | Admitting: Nurse Practitioner

## 2015-08-15 ENCOUNTER — Ambulatory Visit: Payer: Medicare PPO | Admitting: Nurse Practitioner

## 2015-08-22 ENCOUNTER — Encounter: Payer: Self-pay | Admitting: Nurse Practitioner

## 2015-08-22 ENCOUNTER — Ambulatory Visit (INDEPENDENT_AMBULATORY_CARE_PROVIDER_SITE_OTHER): Payer: Medicare PPO | Admitting: Nurse Practitioner

## 2015-08-22 VITALS — BP 150/96 | HR 82 | Ht 70.0 in | Wt 162.0 lb

## 2015-08-22 DIAGNOSIS — G2581 Restless legs syndrome: Secondary | ICD-10-CM | POA: Diagnosis not present

## 2015-08-22 DIAGNOSIS — G43719 Chronic migraine without aura, intractable, without status migrainosus: Secondary | ICD-10-CM

## 2015-08-22 DIAGNOSIS — R413 Other amnesia: Secondary | ICD-10-CM | POA: Diagnosis not present

## 2015-08-22 MED ORDER — DONEPEZIL HCL 10 MG PO TABS: 10.0000 mg | ORAL_TABLET | Freq: Every day | ORAL | Status: DC

## 2015-08-22 MED ORDER — DIVALPROEX SODIUM ER 500 MG PO TB24: 500.0000 mg | ORAL_TABLET | Freq: Every day | ORAL | Status: DC

## 2015-08-22 NOTE — Patient Instructions (Signed)
Continue Aricept to 10 mg daily, will refill Continue  Depakote 500 extended release daily at bedtime, this is for and migraine and will help depression, will refill Memory score is stable, will follow over time Follow-up in 6 months for repeat memory testing next with Dr. Anne HahnWillis

## 2015-08-22 NOTE — Progress Notes (Signed)
I have read the note, and I agree with the clinical assessment and plan.  WILLIS,CHARLES Farren   

## 2015-08-22 NOTE — Progress Notes (Signed)
[facility]  PATIENT: Travis Bryant DOB: February 08, 1961   REASON FOR VISIT: History of closed head injury, migraine headaches and memory loss HISTORY FROM: Patient alone at visit   HISTORY OF PRESENT ILLNESS:[name] is a 55 year old right-handed black male with a history of a closed head injury in the past. He was last seen in the office 01/31/15. The patient has had issues with migraine headaches improved  and he indicates the headache only last about 20 minutes when it occurs. The patient indicates that the headaches are not disabling to him at this time. He takes his  Depakote prn.  He has had a progressive memory disorder as well. He is currently on Aricept 10 mg ,he denies any side effects to the drug .Blood work done in the past was unremarkable for treatable causes of memory loss. The patient is unable to maintain any employment, not even a part-time job. He claims he is driving without any  issues He returns to this office for an evaluation.   REVIEW OF SYSTEMS: Full 14 system review of systems performed and notable only for those listed, all others are neg:  Constitutional: neg  Cardiovascular: neg Ear/Nose/Throat: neg  Skin: neg Eyes: neg Respiratory: neg Gastroitestinal: neg  Hematology/Lymphatic: neg  Endocrine: neg Musculoskeletal:neg Allergy/Immunology: neg Neurological: neg Psychiatric: neg Sleep : neg   ALLERGIES: No Known Allergies  HOME MEDICATIONS: Outpatient Prescriptions Prior to Visit  Medication Sig Dispense Refill  . acetaminophen (TYLENOL) 325 MG tablet Take 650 mg by mouth every 6 (six) hours as needed (for pain).     Marland Kitchen. albuterol (PROVENTIL HFA;VENTOLIN HFA) 108 (90 BASE) MCG/ACT inhaler Inhale 2 puffs into the lungs every 6 (six) hours as needed for wheezing or shortness of breath. 1 Inhaler 2  . cetirizine (ZYRTEC) 10 MG tablet Take 10 mg by mouth daily.    . diclofenac (VOLTAREN) 75 MG EC tablet Take 1 tablet (75 mg total) by  mouth 2 (two) times daily. (Patient taking differently: Take 75 mg by mouth 2 (two) times daily as needed. ) 60 tablet 0  . divalproex (DEPAKOTE ER) 500 MG 24 hr tablet Take 1 tablet (500 mg total) by mouth daily. 30 tablet 6  . donepezil (ARICEPT) 10 MG tablet Take 1 tablet (10 mg total) by mouth daily. 30 tablet 6  . guaiFENesin-codeine 100-10 MG/5ML syrup Take 5 mLs by mouth at bedtime as needed for cough. 120 mL 0  . ipratropium (ATROVENT) 0.06 % nasal spray Place 2 sprays into both nostrils 4 (four) times daily. 15 mL 1  . methocarbamol (ROBAXIN) 500 MG tablet Take 1-2 tablets (500-1,000 mg total) by mouth every 6 (six) hours as needed for muscle spasms. 60 tablet 0  . mirabegron ER (MYRBETRIQ) 25 MG TB24 tablet Take 25 mg by mouth daily as needed.     Marland Kitchen. omeprazole (PRILOSEC) 40 MG capsule Take 1 capsule (40 mg total) by mouth daily. (Patient taking differently: Take 40 mg by mouth daily as needed. ) 30 capsule 1  . sildenafil (REVATIO) 20 MG tablet Take 2-5 tablets by mouth prior to intercouse    . Tamsulosin HCl (FLOMAX) 0.4 MG CAPS Take 0.4 mg by mouth daily.     . fluticasone (FLONASE) 50 MCG/ACT nasal spray Place 1 spray into the nose 2 (two) times daily. 1 g 2   No facility-administered medications prior to visit.    PAST MEDICAL HISTORY: Past Medical History  Diagnosis Date  . Bladder disease   .  Environmental allergies   . History of headache   . Asthma   . Benign enlargement of prostate   . 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 Memory change 01/25/2013  . Restless legs syndrome (RLS) 01/14/2014  . Hematuria   . HIV exposure   . HTN (hypertension)     PAST SURGICAL HISTORY: Past Surgical History  Procedure Laterality Date  . Abdominal surgery    . Brain surgery      FAMILY HISTORY: Family History  Problem Relation Age of Onset  . Lupus Mother   . Arthritis/Rheumatoid Mother   . Hypertension Father   . Heart disease Father   .  Sarcoidosis Sister   . Cancer Maternal Grandmother   . Heart disease Maternal Grandfather   . Cancer - Prostate Paternal Grandfather     SOCIAL HISTORY: Social History   Social History  . Marital Status: Single    Spouse Name: N/A  . Number of Children: 0  . Years of Education: college   Occupational History  . 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 Smoking status: Former Smoker -- 0.10 packs/day for 20 years    Types: Cigarettes    Quit date: 10/11/2012  . Smokeless tobacco: Never Used  . Alcohol Use: 0.0 oz/week    0 Standard drinks or equivalent per week     Comment: socially  . Drug Use: No  . Sexual Activity: Not on file   Other Topics Concern  . Not on file   Social History Narrative   Patient is left handed.   Patient drinks 3 glasses caffeine daily     PHYSICAL EXAM  Filed Vitals:   08/22/15 1415 08/22/15 1515  BP: 149/93 150/96  Pulse: 82   Height: 5\' 10"  (1.778 m)   Weight: 162 lb (73.483 kg)    Body mass index is 23.24 kg/(m^2). General: The patient is alert and cooperative at the time of the examination. Neuromuscular: The patient has a slightly smaller right hand and forearm as compared to the left. Skin: No significant peripheral edema is noted.   Neurologic Exam  Mental status: The Mini-Mental status examination done today shows a total score of 27/30. AFT 7. Clock drawing 4/4.  Cranial nerves: Facial symmetry is present. Speech is normal, no aphasia or dysarthria is noted. Extraocular movements are full. Visual fields are full. Motor: The patient has good strength in all 4 extremities. No focal weakness Sensory examination: soft touch sensation is symmetric on the face, arms, and legs.  Coordination: The patient has good finger-nose-finger and heel-to-shin bilaterally. Gait and station: The patient has a normal gait. Tandem gait is slightly unsteady. Romberg is negative. No drift is seen. Reflexes: Deep  tendon reflexes are slightly elevated on the right arm and right leg relative to the left.    DIAGNOSTIC DATA (LABS, IMAGING, TESTING) -  ASSESSMENT AND PLAN  55 y.o. year old male  has a past medical history closed head injury 1966, memory loss,and  Headaches. He also has a history of HIV exposure and hypertension.  PLAN: Continue Aricept to 10 mg daily, will refill Continue  Depakote 500 extended release daily at bedtime, this is for and migraine and will help depression, will refill Memory score is stable, will follow over time Follow-up in 6 months for repeat memory testing next with Dr. Woody Seller, [facility], [facility], APRN  [facility] [phone] [address]  [address] [place], [place] [address] [phone]

## 2015-08-29 DIAGNOSIS — M778 Other enthesopathies, not elsewhere classified: Secondary | ICD-10-CM | POA: Insufficient documentation

## 2015-08-29 DIAGNOSIS — M7580 Other shoulder lesions, unspecified shoulder: Secondary | ICD-10-CM

## 2015-12-09 ENCOUNTER — Emergency Department (HOSPITAL_COMMUNITY): Payer: Medicare PPO

## 2015-12-09 ENCOUNTER — Emergency Department (HOSPITAL_COMMUNITY)
Admission: EM | Admit: 2015-12-09 | Discharge: 2015-12-09 | Disposition: A | Discharge status: Home / Self Care | Payer: Medicare PPO | Attending: Emergency Medicine | Admitting: Emergency Medicine

## 2015-12-09 ENCOUNTER — Encounter (HOSPITAL_COMMUNITY): Payer: Self-pay | Admitting: Nurse Practitioner

## 2015-12-09 DIAGNOSIS — I10 Essential (primary) hypertension: Secondary | ICD-10-CM | POA: Diagnosis not present

## 2015-12-09 DIAGNOSIS — Z87891 Personal history of nicotine dependence: Secondary | ICD-10-CM | POA: Insufficient documentation

## 2015-12-09 DIAGNOSIS — R079 Chest pain, unspecified: Secondary | ICD-10-CM | POA: Diagnosis not present

## 2015-12-09 DIAGNOSIS — Z79899 Other long term (current) drug therapy: Secondary | ICD-10-CM | POA: Diagnosis not present

## 2015-12-09 DIAGNOSIS — Z87828 Personal history of other (healed) physical injury and trauma: Secondary | ICD-10-CM | POA: Insufficient documentation

## 2015-12-09 DIAGNOSIS — J45909 Unspecified asthma, uncomplicated: Secondary | ICD-10-CM | POA: Diagnosis not present

## 2015-12-09 DIAGNOSIS — R0789 Other chest pain: Secondary | ICD-10-CM

## 2015-12-09 LAB — BASIC METABOLIC PANEL
Anion gap: 11 (ref 5–15)
BUN: 11 mg/dL (ref 6–20)
CALCIUM: 9 mg/dL (ref 8.9–10.3)
CO2: 22 mmol/L (ref 22–32)
Chloride: 104 mmol/L (ref 101–111)
Creatinine, Ser: 1.32 mg/dL — ABNORMAL HIGH (ref 0.61–1.24)
GFR calc non Af Amer: 59 mL/min — ABNORMAL LOW (ref >60)
Glucose, Bld: 116 mg/dL — ABNORMAL HIGH (ref 65–99)
Potassium: 3.9 mmol/L (ref 3.5–5.1)
Sodium: 137 mmol/L (ref 135–145)

## 2015-12-09 LAB — CBC
HCT: 38.8 % — ABNORMAL LOW (ref 39.0–52.0)
Hemoglobin: 12.9 g/dL — ABNORMAL LOW (ref 13.0–17.0)
MCH: 30.9 pg (ref 26.0–34.0)
MCHC: 33.2 g/dL (ref 30.0–36.0)
MCV: 93 fL (ref 78.0–100.0)
Platelets: 216 K/uL (ref 150–400)
RBC: 4.17 MIL/uL — ABNORMAL LOW (ref 4.22–5.81)
RDW: 13.1 % (ref 11.5–15.5)
WBC: 5.6 K/uL (ref 4.0–10.5)

## 2015-12-09 LAB — I-STAT TROPONIN, ED: TROPONIN I, POC: 0 ng/mL (ref 0.00–0.08)

## 2015-12-09 MED ORDER — OMEPRAZOLE 40 MG PO CPDR: 40.0000 mg | DELAYED_RELEASE_CAPSULE | Freq: Every day | ORAL | Status: DC

## 2015-12-09 MED ORDER — GI COCKTAIL ~~LOC~~
30.0000 mL | Freq: Once | ORAL | Status: AC
  Administered 2015-12-09: 30 mL via ORAL
  Filled 2015-12-09: qty 30

## 2015-12-09 MED ORDER — RANITIDINE HCL 150 MG PO CAPS: 150.0000 mg | ORAL_CAPSULE | Freq: Every day | ORAL | Status: DC

## 2015-12-09 NOTE — ED Provider Notes (Signed)
CSN: [id number]     Arrival date & time 12/09/15  1249 History   First MD Initiated Contact with Patient 12/09/15 1423     Chief Complaint  Patient presents with  . Chest Pain     (Consider location/radiation/quality/duration/timing/severity/associated sxs/prior Treatment) HPI   55 year old male presenting with complaints of chest pain. Patient mention for the past 3 days he has been expressing intermittent central chest pain. He described pain as a discomfort, burning sensation with occasional abnormal taste in his mouth and pain at times radiates to his back. Pain is waxing waning and is mild currently. Endorses mild nausea once without any vomiting. Symptoms seems to be brought on after eating some chocolate several days ago.He report mild SOB. He denies any associated fever, chills, lightheadedness, dizziness, shortness of breath, productive cough, hemoptysis, abdominal pain, arm pain or jaw pain, excess or chest pain, or rash. He denies any recent strenuous activities area and he has history of asthma and seasonal allergies but denies any nasal congestion. Last year he was seen and evaluated by cardiologist for chest pain. He had exercise stress test which showed no ischemic EKG changes. Echo 10/17/14 with normal LV size and function, no significant valve disease.  Furthermore patient denies having any prior history of PE or DVT, no recent surgery, prolonged bed rest, unilateral leg swelling or calf pain, active cancer hemoptysis. He report remote history of heartburn but has not had it in a while. He felt that the pain is somewhat similar.  Past Medical History  Diagnosis Date  . Bladder disease   . Environmental allergies   . History of headache   . Asthma   . Benign enlargement of prostate   . 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 Memory change 01/25/2013  . Restless legs syndrome (RLS) 01/14/2014  . Hematuria   . HIV exposure   . HTN (hypertension)     Past Surgical History  Procedure Laterality Date  . Abdominal surgery    . Brain surgery     Family History  Problem Relation Age of Onset  . Lupus Mother   . Arthritis/Rheumatoid Mother   . Hypertension Father   . Heart disease Father   . Sarcoidosis Sister   . Cancer Maternal Grandmother   . Heart disease Maternal Grandfather   . Cancer - Prostate Paternal Grandfather    Social History  Substance Use Topics  . Smoking status: Former Smoker -- 0.10 packs/day for 20 years    Types: Cigarettes    Quit date: 10/11/2012  . Smokeless tobacco: Never Used  . Alcohol Use: 0.0 oz/week    0 Standard drinks or equivalent per week     Comment: socially    Review of Systems  All other systems reviewed and are negative.     Allergies  Review of patient's allergies indicates no known allergies.  Home Medications   Prior to Admission medications   Medication Sig Start Date End Date Taking? Authorizing Provider  acetaminophen (TYLENOL) 325 MG tablet Take 650 mg by mouth every 6 (six) hours as needed (for pain).     Historical Provider, MD  albuterol (PROVENTIL HFA;VENTOLIN HFA) 108 (90 BASE) MCG/ACT inhaler Inhale 2 puffs into the lungs every 6 (six) hours as needed for wheezing or shortness of breath. 07/18/14   Rodolph BongEvan S Corey, MD  amLODipine (NORVASC) 5 MG tablet Take 5 mg by mouth daily. 07/28/15   Historical Provider, MD  cetirizine (  ZYRTEC) 10 MG tablet Take 10 mg by mouth daily.    Historical Provider, MD  diclofenac (VOLTAREN) 75 MG EC tablet Take 1 tablet (75 mg total) by mouth 2 (two) times daily. Patient taking differently: Take 75 mg by mouth 2 (two) times daily as needed.  01/02/15   Ozella Rocks, MD  divalproex (DEPAKOTE ER) 500 MG 24 hr tablet Take 1 tablet (500 mg total) by mouth daily. 08/22/15   Nilda Riggs, NP  donepezil (ARICEPT) 10 MG tablet Take 1 tablet (10 mg total) by mouth daily. 08/22/15   Nilda Riggs, NP  fluticasone (FLONASE) 50 MCG/ACT  nasal spray Place 1 spray into the nose 2 (two) times daily. 12/07/11 12/06/12  Linna Hoff, MD  guaiFENesin-codeine 100-10 MG/5ML syrup Take 5 mLs by mouth at bedtime as needed for cough. 07/18/14   Rodolph Bong, MD  ipratropium (ATROVENT) 0.06 % nasal spray Place 2 sprays into both nostrils 4 (four) times daily. 07/18/14   Rodolph Bong, MD  methocarbamol (ROBAXIN) 500 MG tablet Take 1-2 tablets (500-1,000 mg total) by mouth every 6 (six) hours as needed for muscle spasms. 01/02/15   Ozella Rocks, MD  mirabegron ER (MYRBETRIQ) 25 MG TB24 tablet Take 25 mg by mouth daily as needed.     Historical Provider, MD  omeprazole (PRILOSEC) 40 MG capsule Take 1 capsule (40 mg total) by mouth daily. Patient taking differently: Take 40 mg by mouth daily as needed.  07/18/14   Rodolph Bong, MD  sildenafil (REVATIO) 20 MG tablet Take 2-5 tablets by mouth prior to intercouse 12/14/14   Historical Provider, MD  Tamsulosin HCl (FLOMAX) 0.4 MG CAPS Take 0.4 mg by mouth daily.     Historical Provider, MD   BP 159/89 mmHg  Pulse 105  Temp(Src) 98.8 F (37.1 C) (Oral)  Resp 17  SpO2 99% Physical Exam  Constitutional: He is oriented to person, place, and time. He appears well-developed and well-nourished. No distress.  African-American male in no acute discomfort.  HENT:  Head: Atraumatic.  Eyes: Conjunctivae are normal.  Neck: Neck supple.  Cardiovascular: Normal rate, regular rhythm and intact distal pulses.   Pulmonary/Chest: Effort normal and breath sounds normal. No respiratory distress. He has no wheezes. He exhibits no tenderness.  Abdominal: Soft. Bowel sounds are normal. He exhibits no distension. There is no tenderness.  Musculoskeletal: He exhibits no edema.  Neurological: He is alert and oriented to person, place, and time.  Skin: No rash noted.  Psychiatric: He has a normal mood and affect.  Nursing note and vitals reviewed.   ED Course  Procedures (including critical care time) Labs  Review Labs Reviewed  BASIC METABOLIC PANEL - Abnormal; Notable for the following:    Glucose, Bld 116 (*)    Creatinine, Ser 1.32 (*)    GFR calc non Af Amer 59 (*)    All other components within normal limits  CBC - Abnormal; Notable for the following:    RBC 4.17 (*)    Hemoglobin 12.9 (*)    HCT 38.8 (*)    All other components within normal limits  I-STAT TROPOININ, ED    Imaging Review Dg Chest 2 View  12/09/2015  CLINICAL DATA:  Right-sided chest pain shortness of breath and nausea for 3 days EXAM: CHEST  2 VIEW COMPARISON:  07/22/2012 FINDINGS: The heart size and mediastinal contours are within normal limits. Both lungs are clear. The visualized skeletal structures are unremarkable. IMPRESSION:  No active cardiopulmonary disease. Electronically Signed   By: Esperanza Heir M.D.   On: 12/09/2015 13:16   I have personally reviewed and evaluated these images and lab results as part of my medical decision-making.   EKG Interpretation None      Date: 12/09/2015 @ 12:56PM  Rate: 103  Rhythm: sinus tachycardia  QRS Axis: normal  Intervals: normal  ST/T Wave abnormalities: normal  Conduction Disutrbances: none  Narrative Interpretation:   Old EKG Reviewed: No significant changes noted     MDM   Final diagnoses:  Midsternal chest pain    BP 136/92 mmHg  Pulse 70  Temp(Src) 98.8 F (37.1 C) (Oral)  Resp 17  SpO2 100%   2:46 PM Patient here with intermittent central/external chest pain is nonexertional. Symptoms likely suggestive of GERD. He had a negative stress test last year therefore I have low suspicion for ACS. Aside from a mildly elevated HR of 103, likely from anxiety and his age of 22, pt does not have any other significant risk factor for PE. GI cocktail given, care discussed with Dr. Freida Busman.   2:58 PM EKG without concerning feature. Normal troponin. Labs a mostly reassuring. Evidence of mild renal results should with a creatinine of 1.32. His chest x-ray  is normal.  3:32 PM CP improves with GI cocktail.  Dr. Freida Busman felt d-dimer is not indicated at this time due to low risk of PE.  I encourage pt to f/u with PCP for further management of his condition.  PPI and H2 blocker prescribed.  Return precaution discussed.    Fayrene Helper, PA-C 12/09/15 1538  Lorre Nick, MD 12/15/15 [phone]

## 2015-12-09 NOTE — Discharge Instructions (Signed)

## 2015-12-09 NOTE — ED Provider Notes (Signed)
Medical screening examination/treatment/procedure(s) were conducted as a shared visit with non-physician practitioner(s) and myself.  I personally evaluated the patient during the encounter.   EKG Interpretation   Date/Time:  Saturday December 09 2015 12:56:48 EDT Ventricular Rate:  103 PR Interval:  132 QRS Duration: 86 QT Interval:  330 QTC Calculation: 432 R Axis:   83 Text Interpretation:  Sinus tachycardia Nonspecific T wave abnormality  Abnormal ECG Confirmed by Arius Harnois  MD, Jhordyn Hoopingarner ([id number]) on 12/09/2015 3:27:33  PM     Patient here with intermittent epigastric pain that goes through to his back which is worse after eating. Denies any leg pain or swelling. Pain is been nonpleuritic. Patient states relief with GI cocktail. Suspect that this is GERD. Stable for discharge  Lorre NickAnthony Abhi Moccia, MD 12/09/15 1528

## 2015-12-09 NOTE — ED Notes (Signed)
He c/o 3 day history of intermittent CP and nausea. He noticed onset after eating chocolate pie. He reports mild sob, cough.  He denies fevers, vomiting, dizziness. He is A&Ox4, breathing easily

## 2015-12-12 NOTE — Progress Notes (Signed)
Cardiology Office Note    Date:  12/14/2015   ID:  Travis MetroKeith L Cannell, DOB 03-Jun-1961, MRN [id number]  PCP:  Jonny RuizLlibre, Giovanni, MD  Cardiologist:  Dr. Sanjuana KavaMcAlhaney   Chest pain  History of Present Illness:  Travis Bryant is a 55 y.o. male with a history of HTN, asthma, previous tobacco abuse and enlarged prostate who presents to clinic today for evaluation of chest pain.  Dr. Clifton JamesMcAlhany saw him as a new patient 3/2/016 for evaluation of chest pain and SOB. He ordered a GXT and 2D ECHO. The exercise stress test 10/12/15 showed no ischemic EKG changes. Echo 10/17/14 with normal LV size and function, no significant valve disease. He was seen back in follow up and was doing well and no further cardiac testing was recommended.   Patient was seen in the ER on 12/06/15 for chest pain. He ruled out for MI. His chest pain was relieved by a GI cocktail. He was instructed to follow up with his PCP.    Today he presents to clinic for evaluation of recurrent chest pain. The chest pain is a sharp, central chest "jolt" that is intermittent. Worse with emotional anxiety. He does jumping jacks and weight liffing 2x a week at home. He doesn't get the symptoms with these activities. The chest pain was associated with nausea most recently.Marland Kitchen. He had eaten some chocolate pie that he thinks may have upset his stomach. He had also dranken some beer and smoked a cigarette. Chest pain is not associated with SOB and one time it radiated to his back. No dizziness or syncope. No LE edema, orthopnea or PND.   He does have family hx of CAD. His paternal GF died of a heart attack at age 265. His father had two angioplasties in his early 7260s.     Past Medical History  Diagnosis Date  . Bladder disease   . Environmental allergies   . History of headache   . Asthma   . Benign enlargement of prostate   . 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 Memory change 01/25/2013  . Restless legs syndrome (RLS) 01/14/2014    . Hematuria   . HIV exposure   . HTN (hypertension)     Past Surgical History  Procedure Laterality Date  . Abdominal surgery    . Brain surgery      Current Medications: Outpatient Prescriptions Prior to Visit  Medication Sig Dispense Refill  . acetaminophen (TYLENOL) 325 MG tablet Take 650 mg by mouth every 6 (six) hours as needed (for pain).     Marland Kitchen. albuterol (PROVENTIL HFA;VENTOLIN HFA) 108 (90 BASE) MCG/ACT inhaler Inhale 2 puffs into the lungs every 6 (six) hours as needed for wheezing or shortness of breath. 1 Inhaler 2  . amLODipine (NORVASC) 5 MG tablet Take 5 mg by mouth daily.  0  . cetirizine (ZYRTEC) 10 MG tablet Take 10 mg by mouth daily.    . divalproex (DEPAKOTE ER) 500 MG 24 hr tablet Take 1 tablet (500 mg total) by mouth daily. 30 tablet 6  . donepezil (ARICEPT) 10 MG tablet Take 1 tablet (10 mg total) by mouth daily. 30 tablet 6  . guaiFENesin-codeine 100-10 MG/5ML syrup Take 5 mLs by mouth at bedtime as needed for cough. 120 mL 0  . ipratropium (ATROVENT) 0.06 % nasal spray Place 2 sprays into both nostrils 4 (four) times daily. 15 mL 1  . methocarbamol (ROBAXIN) 500 MG tablet  Take 1-2 tablets (500-1,000 mg total) by mouth every 6 (six) hours as needed for muscle spasms. 60 tablet 0  . mirabegron ER (MYRBETRIQ) 25 MG TB24 tablet Take 25 mg by mouth daily.     . ranitidine (ZANTAC) 150 MG capsule Take 1 capsule (150 mg total) by mouth daily. 30 capsule 0  . sildenafil (REVATIO) 20 MG tablet Take 2-5 tablets by mouth prior to intercouse    . Tamsulosin HCl (FLOMAX) 0.4 MG CAPS Take 0.4 mg by mouth daily.     Marland Kitchen omeprazole (PRILOSEC) 40 MG capsule Take 1 capsule (40 mg total) by mouth daily. 30 capsule 1  . fluticasone (FLONASE) 50 MCG/ACT nasal spray Place 1 spray into the nose 2 (two) times daily. 1 g 2  . diclofenac (VOLTAREN) 75 MG EC tablet Take 1 tablet (75 mg total) by mouth 2 (two) times daily. (Patient taking differently: Take 75 mg by mouth 2 (two) times daily  as needed. ) 60 tablet 0   No facility-administered medications prior to visit.     Allergies:   Review of patient's allergies indicates no known allergies.   Social History   Social History  . Marital Status: Single    Spouse Name: N/A  . Number of Children: 0  . Years of Education: college   Occupational History  . Teacher-part time [employer]   Social History Main Topics  . Smoking status: Former Smoker -- 0.10 packs/day for 20 years    Types: Cigarettes    Quit date: 10/11/2012  . Smokeless tobacco: Never Used  . Alcohol Use: 0.0 oz/week    0 Standard drinks or equivalent per week     Comment: socially  . Drug Use: No  . Sexual Activity: Not Asked   Other Topics Concern  . None   Social History Narrative   Patient is left handed.   Patient drinks 3 glasses caffeine daily     Family History:  The patient's family history includes Arthritis/Rheumatoid in his mother; Cancer in his maternal grandmother; Cancer - Prostate in his paternal grandfather; Heart disease in his father and maternal grandfather; Hypertension in his father; Lupus in his mother; Sarcoidosis in his sister.   ROS:   Please see the history of present illness.    ROS All other systems reviewed and are negative.   PHYSICAL EXAM:   VS:  BP 120/84 mmHg  Pulse 68  Ht  (1.778 m)  Wt 158 lb 6.4 oz (71.85 kg)  BMI 22.73 kg/m2   GEN: Well nourished, well developed, in no acute distress HEENT: normal Neck: no JVD, carotid bruits, or masses Cardiac: RRR; no murmurs, rubs, or gallops,no edema  Respiratory:  clear to auscultation bilaterally, normal work of breathing GI: soft, nontender, nondistended, + BS MS: no deformity or atrophy Skin: warm and dry, no rash Neuro:  Alert and Oriented x 3, Strength and sensation are intact Psych: euthymic mood, full affect  Wt Readings from Last 3 Encounters:  12/14/15 158 lb 6.4 oz (71.85 kg)  08/22/15 162 lb (73.483 kg)  01/31/15 162 lb  (73.483 kg)      Studies/Labs Reviewed:   EKG:  EKG is NOT ordered today.   Recent Labs: 12/09/2015: BUN 11; Creatinine, Ser 1.32*; Hemoglobin 12.9*; Platelets 216; Potassium 3.9; Sodium 137   Lipid Panel No results found for: CHOL, TRIG, HDL, CHOLHDL, VLDL, LDLCALC, LDLDIRECT  Additional studies/ records that were reviewed today include:  2D ECHO: 10/17/2014 LV EF: 65% -  70% Study Conclusions - Left ventricle: The cavity size was normal. Wall thickness was normal. Systolic function was vigorous. The estimated ejection fraction was in the range of 65% to 70%. Wall motion was normal; there were no regional wall motion abnormalities. Left ventricular diastolic function parameters were normal. - Mitral valve: Mildly thickened leaflets . There is a partially flail subvalvular cord attached to the anterior leaflet. This does not result in any prolpase of the anterior leaflet. There was trivial regurgitation. - Left atrium: The atrium was normal in size. - Tricuspid valve: There was mild regurgitation. - Pulmonary arteries: PA peak pressure: 34 mm Hg (S). - Inferior vena cava: The vessel was normal in size. The respirophasic diameter changes were in the normal range (>= 50%), consistent with normal central venous pressure. Impressions: - LVEF 65-70%, normal wall thickness, normal wall motion, normal diastolic function, thickened mitral leaflets with a partially flail anterior cord, no signficant prolapse or MR is noted, mild TR, top normal RVSP.  GXT: 10/12/15 Comments: LVH by voltage No ischemic EKG changes Fair exercise tolerance Brief period of "chest tightness" Hypertensive BP response to exercise  ASSESSMENT:    1. Chest pain, unspecified chest pain type   2. Essential hypertension   3. History of tobacco abuse   4. Asthma, unspecified asthma severity, uncomplicated      PLAN:  In order of problems listed above:  Chest pain: atypical  and relieved by GI cocktail. We discussed continued monitoring vs ETT Myoview and he prefers continuing to monitor for now. We will refill his prilosec  HTN: BP well controlled on current regimen   History of tobacco abuse: he only smoked socially if he was drinking. He never buys his own packs.   Asthma and allergies: he thinks this has contributed to some of his chest pain.   Medication Adjustments/Labs and Tests Ordered: Current medicines are reviewed at length with the patient today.  Concerns regarding medicines are outlined above.  Medication changes, Labs and Tests ordered today are listed in the Patient Instructions below. Patient Instructions  Medication Instructions:  Your physician recommends that you continue on your current medications as directed. Please refer to the Current Medication list given to you today.   Labwork: None ordered  Testing/Procedures: None ordered  Follow-Up: Your physician recommends that you schedule a follow-up appointment in: 4 MONTHS WITH DR. Clifton James   Any Other Special Instructions Will Be Listed Below (If Applicable).     If you need a refill on your cardiac medications before your next appointment, please call your pharmacy.       Charlestine Massed  12/14/2015 9:04 AM    [facility] [address] [place], [place], [place]  [address] Phone: [phone]; Fax: [phone]

## 2015-12-14 ENCOUNTER — Ambulatory Visit (INDEPENDENT_AMBULATORY_CARE_PROVIDER_SITE_OTHER): Payer: Medicare PPO | Admitting: Physician Assistant

## 2015-12-14 ENCOUNTER — Encounter: Payer: Self-pay | Admitting: Physician Assistant

## 2015-12-14 VITALS — BP 120/84 | HR 68 | Ht 70.0 in | Wt 158.4 lb

## 2015-12-14 DIAGNOSIS — J45909 Unspecified asthma, uncomplicated: Secondary | ICD-10-CM

## 2015-12-14 DIAGNOSIS — R079 Chest pain, unspecified: Secondary | ICD-10-CM

## 2015-12-14 DIAGNOSIS — Z87891 Personal history of nicotine dependence: Secondary | ICD-10-CM

## 2015-12-14 DIAGNOSIS — I10 Essential (primary) hypertension: Secondary | ICD-10-CM | POA: Diagnosis not present

## 2015-12-14 MED ORDER — OMEPRAZOLE 40 MG PO CPDR: 40.0000 mg | DELAYED_RELEASE_CAPSULE | Freq: Every day | ORAL | Status: DC

## 2015-12-14 NOTE — Patient Instructions (Addendum)
Medication Instructions:  Your physician recommends that you continue on your current medications as directed. Please refer to the Current Medication list given to you today.   Labwork: None ordered  Testing/Procedures: None ordered  Follow-Up: Your physician recommends that you schedule a follow-up appointment in: 4 MONTHS WITH DR. MCALHANY   Any Other Special Instructions Will Be Listed Below (If Applicable).    If you need a refill on your cardiac medications before your next appointment, please call your pharmacy.   

## 2015-12-27 ENCOUNTER — Ambulatory Visit (INDEPENDENT_AMBULATORY_CARE_PROVIDER_SITE_OTHER): Payer: Medicare PPO | Admitting: Allergy and Immunology

## 2015-12-27 ENCOUNTER — Encounter: Payer: Self-pay | Admitting: Allergy and Immunology

## 2015-12-27 ENCOUNTER — Other Ambulatory Visit: Payer: Self-pay | Admitting: *Deleted

## 2015-12-27 VITALS — BP 112/78 | HR 92 | Temp 98.2°F | Resp 20 | Ht 69.0 in | Wt 153.6 lb

## 2015-12-27 DIAGNOSIS — R053 Chronic cough: Secondary | ICD-10-CM | POA: Insufficient documentation

## 2015-12-27 DIAGNOSIS — J302 Other seasonal allergic rhinitis: Secondary | ICD-10-CM | POA: Insufficient documentation

## 2015-12-27 DIAGNOSIS — R05 Cough: Secondary | ICD-10-CM

## 2015-12-27 DIAGNOSIS — K219 Gastro-esophageal reflux disease without esophagitis: Secondary | ICD-10-CM | POA: Diagnosis not present

## 2015-12-27 DIAGNOSIS — J45901 Unspecified asthma with (acute) exacerbation: Secondary | ICD-10-CM | POA: Diagnosis not present

## 2015-12-27 DIAGNOSIS — J3089 Other allergic rhinitis: Secondary | ICD-10-CM

## 2015-12-27 DIAGNOSIS — J4530 Mild persistent asthma, uncomplicated: Secondary | ICD-10-CM | POA: Insufficient documentation

## 2015-12-27 DIAGNOSIS — J019 Acute sinusitis, unspecified: Secondary | ICD-10-CM | POA: Insufficient documentation

## 2015-12-27 DIAGNOSIS — J0100 Acute maxillary sinusitis, unspecified: Secondary | ICD-10-CM | POA: Diagnosis not present

## 2015-12-27 MED ORDER — AMOXICILLIN-POT CLAVULANATE 875-125 MG PO TABS: 1.0000 | ORAL_TABLET | Freq: Two times a day (BID) | ORAL | Status: AC

## 2015-12-27 MED ORDER — METHYLPREDNISOLONE ACETATE 80 MG/ML IJ SUSP: 80.0000 mg | Freq: Once | INTRAMUSCULAR | Status: DC

## 2015-12-27 MED ORDER — PREDNISONE 1 MG PO TABS: 10.0000 mg | ORAL_TABLET | ORAL | Status: DC

## 2015-12-27 MED ORDER — BECLOMETHASONE DIPROPIONATE 80 MCG/ACT IN AERS: 2.0000 | INHALATION_SPRAY | Freq: Two times a day (BID) | RESPIRATORY_TRACT | Status: DC

## 2015-12-27 MED ORDER — METHYLPREDNISOLONE ACETATE 80 MG/ML IJ SUSP
80.0000 mg | Freq: Once | INTRAMUSCULAR | Status: AC
  Administered 2015-12-27: 80 mg via INTRAMUSCULAR

## 2015-12-27 MED ORDER — FLUTICASONE PROPIONATE 50 MCG/ACT NA SUSP: 2.0000 | Freq: Every day | NASAL | Status: DC

## 2015-12-27 MED ORDER — FLUTICASONE PROPIONATE HFA 220 MCG/ACT IN AERO: 2.0000 | INHALATION_SPRAY | Freq: Two times a day (BID) | RESPIRATORY_TRACT | Status: DC

## 2015-12-27 MED ORDER — LEVOCETIRIZINE DIHYDROCHLORIDE 5 MG PO TABS: 5.0000 mg | ORAL_TABLET | Freq: Every evening | ORAL | Status: DC

## 2015-12-27 NOTE — Telephone Encounter (Signed)
PA received for Qvar 80 per Dr Nunzio CobbsBobbitt switch to Flovent 220 2 puffs BID with spacer. Patient advised

## 2015-12-27 NOTE — Addendum Note (Signed)
Addended by: Clifton JamesLARK, HEATHER L on: 12/27/2015 04:19 PM   Modules accepted: Orders

## 2015-12-27 NOTE — Assessment & Plan Note (Signed)
   Depo-Medrol 80 mg was administered in the office.  Prednisone has been provided and is to be started tomorrow as follows: 20 mg daily x 4 days, 10 mg x1 day, then stop.  A prescription has been provided for Augmentin 875/125 mg twice a day 10 days.  A prescription has been provided for fluticasone nasal spray, 2 sprays per nostril daily as needed. Proper nasal spray technique has been discussed and demonstrated.  Nasal saline lavage (NeilMed) as needed has been recommended along with instructions for proper administration.  Guaifenesin 1200 mg twice daily as needed with adequate hydration as discussed.

## 2015-12-27 NOTE — Patient Instructions (Signed)
Acute sinusitis  Depo-Medrol 80 mg was administered in the office.  Prednisone has been provided and is to be started tomorrow as follows: 20 mg daily x 4 days, 10 mg x1 day, then stop.  A prescription has been provided for Augmentin 875/125 mg twice a day 10 days.  A prescription has been provided for fluticasone nasal spray, 2 sprays per nostril daily as needed. Proper nasal spray technique has been discussed and demonstrated.  Nasal saline lavage (NeilMed) as needed has been recommended along with instructions for proper administration.  Guaifenesin 1200 mg twice daily as needed with adequate hydration as discussed.   Asthma with acute exacerbation Exacerbation was likely triggered by acute sinusitis and environmental allergen exposure.  Depo-Medrol and prednisone have been provided (as above).  A prescription has been provided for Qvar (beclomethasone) 80 g, 2 inhalations twice a day.  To maximize pulmonary deposition, a spacer has been provided along with instructions for its proper administration with an HFA inhaler.  Continue albuterol every 4-6 hours as needed.  The patient has been asked to contact me if his symptoms persist or progress. Otherwise, he may return for follow up in 6 weeks.  Seasonal and perennial allergic rhinitis  Aeroallergen avoidance measures have been discussed and provided in written form.  A prescription has been provided for fluticasone nasal spray (as above).  A prescription has been provided for levocetirizine, 5 mg daily as needed.  If allergen avoidance measures and medications fail to adequately relieve symptoms, aeroallergen immunotherapy will be considered.  GERD (gastroesophageal reflux disease) Reflux may be contributing to the asthma/persistent cough.  Appropriate reflux some modifications have been discussed and provided in written form.  I have encouraged compliance with omeprazole and ranitidine as prescribed.  Persistent  cough The most common causes of chronic cough include the following: upper airway cough syndrome (UACS) which is caused by variety of rhinosinus conditions; asthma; gastroesophageal reflux disease (GERD); chronic bronchitis from cigarette smoking or other inhaled environmental irritants; non-asthmatic eosinophilic bronchitis; and bronchiectasis. In prospective studies, these conditions have accounted for up to 94% of the causes of chronic cough in immunocompetent adults. The history and physical examination suggest that his cough is multifactorial with contribution from postnasal drainage, bronchial hyperresponsiveness, and acid reflux. We will address these issues at this time.   Treatment plan as outlined above.  We will regroup in 6 weeks to assess treatment response and adjust therapy accordingly.   Return in about 6 weeks (around 02/07/2016), or if symptoms worsen or fail to improve.  Lifestyle Changes for Controlling GERD  When you have GERD, stomach acid feels as if it's backing up toward your mouth. Whether or not you take medication to control your GERD, your symptoms can often be improved with lifestyle changes.   Raise Your Head  Reflux is more likely to strike when you're lying down flat, because stomach fluid can  flow backward more easily. Raising the head of your bed 4-6 inches can help. To do this:  Slide blocks or books under the legs at the head of your bed. Or, place a wedge under  the mattress. Many foam stores can make a suitable wedge for you. The wedge  should run from your waist to the top of your head.  Don't just prop your head on several pillows. This increases pressure on your  stomach. It can make GERD worse.  Watch Your Eating Habits Certain foods may increase the acid in your stomach or relax the lower esophageal  sphincter, making GERD more likely. It's best to avoid the following:  Coffee, tea, and carbonated drinks (with and without  caffeine)  Fatty, fried, or spicy food  Mint, chocolate, onions, and tomatoes  Any other foods that seem to irritate your stomach or cause you pain  Relieve the Pressure  Eat smaller meals, even if you have to eat more often.  Don't lie down right after you eat. Wait a few hours for your stomach to empty.  Avoid tight belts and tight-fitting clothes.  Lose excess weight.  Tobacco and Alcohol  Avoid smoking tobacco and drinking alcohol. They can make GERD symptoms worse.  Reducing Pollen Exposure  The American Academy of Allergy, Asthma and Immunology suggests the following steps to reduce your exposure to pollen during allergy seasons.    1. Do not hang sheets or clothing out to dry; pollen may collect on these items. 2. Do not mow lawns or spend time around freshly cut grass; mowing stirs up pollen. 3. Keep windows closed at night.  Keep car windows closed while driving. 4. Minimize morning activities outdoors, a time when pollen counts are usually at their highest. 5. Stay indoors as much as possible when pollen counts or humidity is high and on windy days when pollen tends to remain in the air longer. 6. Use air conditioning when possible.  Many air conditioners have filters that trap the pollen spores. 7. Use a HEPA room air filter to remove pollen form the indoor air you breathe.   Control of Mold Allergen  Mold and fungi can grow on a variety of surfaces provided certain temperature and moisture conditions exist.  Outdoor molds grow on plants, decaying vegetation and soil.  The major outdoor mold, Alternaria and Cladosporium, are found in very high numbers during hot and dry conditions.  Generally, a late Summer - Fall peak is seen for common outdoor fungal spores.  Rain will temporarily lower outdoor mold spore count, but counts rise rapidly when the rainy period ends.  The most important indoor molds are Aspergillus and Penicillium.  Dark, humid and poorly ventilated  basements are ideal sites for mold growth.  The next most common sites of mold growth are the bathroom and the kitchen.  Outdoor [employer] 5. Use air conditioning and keep windows closed 6. Avoid exposure to decaying vegetation. 7. Avoid leaf raking. 8. Avoid grain handling. 9. Consider wearing a face mask if working in moldy areas.  Indoor Mold Control 2. Maintain humidity below 50%. 3. Clean washable surfaces with 5% bleach solution. 4. Remove sources e.g. Contaminated carpets.  Control of House Dust Mite Allergen  House dust mites play a major role in allergic asthma and rhinitis.  They occur in environments with high humidity wherever human skin, the food for dust mites is found. High levels have been detected in dust obtained from mattresses, pillows, carpets, upholstered furniture, bed covers, clothes and soft toys.  The principal allergen of the house dust mite is found in its feces.  A gram of dust may contain 1,000 mites and 250,000 fecal particles.  Mite antigen is easily measured in the air during house cleaning activities.    1. Encase mattresses, including the box spring, and pillow, in an air tight cover.  Seal the zipper end of the encased mattresses with wide adhesive tape. 2. Wash the bedding in water of 130 degrees Farenheit weekly.  Avoid cotton comforters/quilts and flannel bedding: the most ideal bed covering is the dacron comforter. 3. Remove  all upholstered furniture from the bedroom. 4. Remove carpets, carpet padding, rugs, and non-washable window drapes from the bedroom.  Wash drapes weekly or use plastic window coverings. 5. Remove all non-washable stuffed toys from the bedroom.  Wash stuffed toys weekly. 6. Have the room cleaned frequently with a vacuum cleaner and a damp dust-mop.  The patient should not be in a room which is being cleaned and should wait 1 hour after cleaning before going into the room. 7. Close and seal all heating outlets in the bedroom.   Otherwise, the room will become filled with dust-laden air.  An electric heater can be used to heat the room. 8. Reduce indoor humidity to less than 50%.  Do not use a humidifier.

## 2015-12-27 NOTE — Assessment & Plan Note (Signed)
   Aeroallergen avoidance measures have been discussed and provided in written form.  A prescription has been provided for fluticasone nasal spray (as above).  A prescription has been provided for levocetirizine, 5 mg daily as needed.  If allergen avoidance measures and medications fail to adequately relieve symptoms, aeroallergen immunotherapy will be considered.

## 2015-12-27 NOTE — Assessment & Plan Note (Signed)
Reflux may be contributing to the asthma/persistent cough.  Appropriate reflux some modifications have been discussed and provided in written form.  I have encouraged compliance with omeprazole and ranitidine as prescribed.

## 2015-12-27 NOTE — Progress Notes (Signed)
New Patient Note  RE: Travis Bryant MRN: [id number] DOB: 12-14-1960 Date of Office Visit: 12/27/2015  Referring provider: Jonny Ruiz, MD Primary care provider: Jonny Ruiz, MD  Chief Complaint: Sinusitis; Asthma; and Allergic Rhinitis    History of present illness: HPI Comments: Travis Bryant is a 55 y.o. male presenting today for consultation of rhinosinusitis and asthma. He was last seen in this clinic over 3 years ago in January 2014.  He experiences increased upper and lower rest or symptoms in the springtime and summer.  His allergy skin test results from several years ago reveal polysensitivity including pollens, molds, and dust mite antigen.  He reports that over the past week he has experienced nasal congestion, rhinorrhea, thick postnasal drainage, coughing, and sinus pressure over the cheekbones, between the eyes, and over the forehead.  He reports that his symptoms have progressed over the past week and he has been experiencing fevers, chills, and discolored mucus production.  He also complains of coughing, chest tightness, and wheezing.  He has been experiencing these lower respiratory symptoms multiple times per day as well as nocturnal awakenings due to lower respiratory symptoms.  He has gastroesophageal reflux disease and was recently prescribed omeprazole, however he did not pick up this prescription.   Assessment and plan: Acute sinusitis  Depo-Medrol 80 mg was administered in the office.  Prednisone has been provided and is to be started tomorrow as follows: 20 mg daily x 4 days, 10 mg x1 day, then stop.  A prescription has been provided for Augmentin 875/125 mg twice a day 10 days.  A prescription has been provided for fluticasone nasal spray, 2 sprays per nostril daily as needed. Proper nasal spray technique has been discussed and demonstrated.  Nasal saline lavage (NeilMed) as needed has been recommended along with instructions for proper  administration.  Guaifenesin 1200 mg twice daily as needed with adequate hydration as discussed.   Asthma with acute exacerbation Exacerbation was likely triggered by acute sinusitis and environmental allergen exposure.  Depo-Medrol and prednisone have been provided (as above).  A prescription has been provided for Qvar (beclomethasone) 80 g, 2 inhalations twice a day.  To maximize pulmonary deposition, a spacer has been provided along with instructions for its proper administration with an HFA inhaler.  Continue albuterol every 4-6 hours as needed.  The patient has been asked to contact me if his symptoms persist or progress. Otherwise, he may return for follow up in 6 weeks.  Seasonal and perennial allergic rhinitis  Aeroallergen avoidance measures have been discussed and provided in written form.  A prescription has been provided for fluticasone nasal spray (as above).  A prescription has been provided for levocetirizine, 5 mg daily as needed.  If allergen avoidance measures and medications fail to adequately relieve symptoms, aeroallergen immunotherapy will be considered.  GERD (gastroesophageal reflux disease) Reflux may be contributing to the asthma/persistent cough.  Appropriate reflux some modifications have been discussed and provided in written form.  I have encouraged compliance with omeprazole and ranitidine as prescribed.  Persistent cough The most common causes of chronic cough include the following: upper airway cough syndrome (UACS) which is caused by variety of rhinosinus conditions; asthma; gastroesophageal reflux disease (GERD); chronic bronchitis from cigarette smoking or other inhaled environmental irritants; non-asthmatic eosinophilic bronchitis; and bronchiectasis. In prospective studies, these conditions have accounted for up to 94% of the causes of chronic cough in immunocompetent adults. The history and physical examination suggest that his cough is  multifactorial  with contribution from postnasal drainage, bronchial hyperresponsiveness, and acid reflux. We will address these issues at this time.   Treatment plan as outlined above.  We will regroup in 6 weeks to assess treatment response and adjust therapy accordingly.    Meds ordered this encounter  Medications  . amoxicillin-clavulanate (AUGMENTIN) 875-125 MG tablet    Sig: Take 1 tablet by mouth 2 (two) times daily.    Dispense:  20 tablet    Refill:  0  . beclomethasone (QVAR) 80 MCG/ACT inhaler    Sig: Inhale 2 puffs into the lungs 2 (two) times daily.    Dispense:  1 Inhaler    Refill:  5  . levocetirizine (XYZAL) 5 MG tablet    Sig: Take 1 tablet (5 mg total) by mouth every evening.    Dispense:  30 tablet    Refill:  5  . fluticasone (FLONASE) 50 MCG/ACT nasal spray    Sig: Place 2 sprays into both nostrils daily.    Dispense:  16 g    Refill:  5  . methylPREDNISolone acetate (DEPO-MEDROL) injection 80 mg    Sig:   . predniSONE (DELTASONE) tablet 10 mg    Sig:     Diagnositics: Spirometry: FVC was 4.01 L and FEV1 was 3.54 L without significant post bronchodilator improvement.  Please see scanned spirometry results for details.    Physical examination: Blood pressure 112/78, pulse 92, temperature 98.2 F (36.8 C), resp. rate 20, height 5\' 9"  (1.753 m), weight 153 lb 9.6 oz (69.673 kg).  General: Alert, interactive, in no acute distress. HEENT: TMs pearly gray, turbinates edematous with thick discharge, post-pharynx erythematous. Neck: Supple without lymphadenopathy. Lungs: Clear to auscultation without wheezing, rhonchi or rales. CV: Normal S1, S2 without murmurs. Abdomen: Nondistended, nontender. Skin: Warm and dry, without lesions or rashes. Extremities:  No clubbing, cyanosis or edema. Neuro:   Grossly intact.  Review of systems:  Review of Systems  Constitutional: Positive for fever and chills. Negative for weight loss.  HENT: Positive for  congestion. Negative for nosebleeds.   Eyes: Negative for blurred vision.  Respiratory: Positive for cough, sputum production, shortness of breath and wheezing. Negative for hemoptysis.   Cardiovascular: Negative for chest pain.  Gastrointestinal: Negative for diarrhea and constipation.  Genitourinary: Negative for dysuria.  Musculoskeletal: Negative for myalgias and joint pain.  Skin: Negative for itching and rash.  Neurological: Positive for headaches. Negative for dizziness.  Endo/Heme/Allergies: Positive for environmental allergies. Does not bruise/bleed easily.    Past medical history:  Past Medical History  Diagnosis Date  . Bladder disease   . Environmental allergies   . History of headache   . Asthma   . Benign enlargement of prostate   . 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 Memory change 01/25/2013  . Restless legs syndrome (RLS) 01/14/2014  . Hematuria   . HIV exposure   . HTN (hypertension)     Past surgical history:  Past Surgical History  Procedure Laterality Date  . Abdominal surgery    . Brain surgery      Family history: Family History  Problem Relation Age of Onset  . Lupus Mother   . Arthritis/Rheumatoid Mother   . Hypertension Father   . Heart disease Father   . Sarcoidosis Sister   . Cancer Maternal Grandmother   . Heart disease Maternal Grandfather   . Cancer - Prostate Paternal Grandfather     Social history: Social  History   Social History  . Marital Status: Single    Spouse Name: N/A  . Number of Children: 0  . Years of Education: college   Occupational History  . Teacher-part time [employer]   Social History Main Topics  . Smoking status: Former Smoker -- 0.10 packs/day for 20 years    Types: Cigarettes    Quit date: 10/11/2012  . Smokeless tobacco: Never Used  . Alcohol Use: 0.0 oz/week    0 Standard drinks or equivalent per week     Comment: socially  . Drug Use: No  . Sexual  Activity: Not on file   Other Topics Concern  . Not on file   Social History Narrative   Patient is left handed.   Patient drinks 3 glasses caffeine daily   Environmental History: The patient lives in a 56 year old apartment with hardwood floors throughout, gas heat, and window air conditioning units.  There is a dog in the apartment which has access to his bedroom.  He is a nonsmoker.    Medication List       This list is accurate as of: 12/27/15  1:23 PM.  Always use your most recent med list.               acetaminophen 325 MG tablet  Commonly known as:  TYLENOL  Take 650 mg by mouth every 6 (six) hours as needed (for pain).     albuterol 108 (90 Base) MCG/ACT inhaler  Commonly known as:  PROVENTIL HFA;VENTOLIN HFA  Inhale 2 puffs into the lungs every 6 (six) hours as needed for wheezing or shortness of breath.     amLODipine 5 MG tablet  Commonly known as:  NORVASC  Take 5 mg by mouth daily.     amoxicillin-clavulanate 875-125 MG tablet  Commonly known as:  AUGMENTIN  Take 1 tablet by mouth 2 (two) times daily.     beclomethasone 80 MCG/ACT inhaler  Commonly known as:  QVAR  Inhale 2 puffs into the lungs 2 (two) times daily.     cetirizine 10 MG tablet  Commonly known as:  ZYRTEC  Take 10 mg by mouth daily. Reported on 12/27/2015     cyclobenzaprine 5 MG tablet  Commonly known as:  FLEXERIL  Reported on 12/27/2015     diclofenac 75 MG EC tablet  Commonly known as:  VOLTAREN  Take 75 mg by mouth 2 (two) times daily as needed for mild pain.     divalproex 500 MG 24 hr tablet  Commonly known as:  DEPAKOTE ER  Take 1 tablet (500 mg total) by mouth daily.     donepezil 10 MG tablet  Commonly known as:  ARICEPT  Take 1 tablet (10 mg total) by mouth daily.     fluticasone 50 MCG/ACT nasal spray  Commonly known as:  FLONASE  Place 2 sprays into both nostrils daily.     guaiFENesin-codeine 100-10 MG/5ML syrup  Take 5 mLs by mouth at bedtime as needed for  cough.     ipratropium 0.06 % nasal spray  Commonly known as:  ATROVENT  Place 2 sprays into both nostrils 4 (four) times daily.     levocetirizine 5 MG tablet  Commonly known as:  XYZAL  Take 1 tablet (5 mg total) by mouth every evening.     loratadine 10 MG tablet  Commonly known as:  CLARITIN  Take 10 mg by mouth daily.     meloxicam 15 MG tablet  Commonly known as:  MOBIC  take 1 tablet by mouth once daily with food     methocarbamol 500 MG tablet  Commonly known as:  ROBAXIN  Take 1-2 tablets (500-1,000 mg total) by mouth every 6 (six) hours as needed for muscle spasms.     mirabegron ER 25 MG Tb24 tablet  Commonly known as:  MYRBETRIQ  Take 25 mg by mouth daily. Reported on 12/27/2015     omeprazole 40 MG capsule  Commonly known as:  PRILOSEC  Take 1 capsule (40 mg total) by mouth daily.     ranitidine 150 MG capsule  Commonly known as:  ZANTAC  Take 1 capsule (150 mg total) by mouth daily.     sildenafil 20 MG tablet  Commonly known as:  REVATIO  Take 2-5 tablets by mouth prior to intercouse     tamsulosin 0.4 MG Caps capsule  Commonly known as:  FLOMAX  Take 0.4 mg by mouth daily.        Known medication allergies: No Known Allergies  I appreciate the opportunity to take part in this Bynum's care. Please do not hesitate to contact me with questions.  Sincerely,   R. Jorene Guest, MD

## 2015-12-27 NOTE — Assessment & Plan Note (Addendum)
Exacerbation was likely triggered by acute sinusitis and environmental allergen exposure.  Depo-Medrol and prednisone have been provided (as above).  A prescription has been provided for Qvar (beclomethasone) 80 g, 2 inhalations twice a day.  To maximize pulmonary deposition, a spacer has been provided along with instructions for its proper administration with an HFA inhaler.  Continue albuterol every 4-6 hours as needed.  The patient has been asked to contact me if his symptoms persist or progress. Otherwise, he may return for follow up in 6 weeks.

## 2015-12-27 NOTE — Assessment & Plan Note (Signed)
The most common causes of chronic cough include the following: upper airway cough syndrome (UACS) which is caused by variety of rhinosinus conditions; asthma; gastroesophageal reflux disease (GERD); chronic bronchitis from cigarette smoking or other inhaled environmental irritants; non-asthmatic eosinophilic bronchitis; and bronchiectasis. In prospective studies, these conditions have accounted for up to 94% of the causes of chronic cough in immunocompetent adults. The history and physical examination suggest that his cough is multifactorial with contribution from postnasal drainage, bronchial hyperresponsiveness, and acid reflux. We will address these issues at this time.   Treatment plan as outlined above.  We will regroup in 6 weeks to assess treatment response and adjust therapy accordingly.

## 2015-12-29 ENCOUNTER — Telehealth: Payer: Self-pay | Admitting: Allergy and Immunology

## 2015-12-29 NOTE — Telephone Encounter (Signed)
Spoke with Dr. Willa RoughHicks and had me call pt and inform him to stop Augmentin for the weekend and to continue on on the other meds. He is too let us know on Monday how he is doing.

## 2015-12-29 NOTE — Telephone Encounter (Signed)
PT CALLED AND SAID THAT THE AMOXICILLIN IS GIVING HIM  DIARRHEA. AND WANTS TO KNOW WHAT TO DO. [phone]. [facility] [address].

## 2016-01-01 ENCOUNTER — Telehealth: Payer: Self-pay | Admitting: Allergy and Immunology

## 2016-01-01 MED ORDER — CEFDINIR 300 MG PO CAPS: 300.0000 mg | ORAL_CAPSULE | Freq: Two times a day (BID) | ORAL | Status: DC

## 2016-01-01 NOTE — Telephone Encounter (Signed)
Switch to cefdinir 300mg  bid x 7 days. Discontinue Augmentin. Thanks.

## 2016-01-01 NOTE — Telephone Encounter (Signed)
Pt was calling to get an update on his message from this morning.   Please Advise

## 2016-01-01 NOTE — Telephone Encounter (Signed)
He was prescribed an antibiotic and had been taking it but it upset his stomach and gave him diarrhea. He wants to know if he can be called in something different.  [address] [facility] [address]

## 2016-01-01 NOTE — Telephone Encounter (Signed)
Please advise 

## 2016-01-02 ENCOUNTER — Other Ambulatory Visit: Payer: Self-pay

## 2016-01-02 MED ORDER — CEFDINIR 300 MG PO CAPS: 300.0000 mg | ORAL_CAPSULE | Freq: Two times a day (BID) | ORAL | Status: DC

## 2016-01-02 NOTE — Telephone Encounter (Signed)
SENT IN MEDS AND LM FOR PT TO CALL US BACK 

## 2016-01-03 NOTE — Telephone Encounter (Signed)
Left message for patient to call office back.

## 2016-01-03 NOTE — Telephone Encounter (Signed)
Patient informed to stop Augmentin and to start Cefidinir

## 2016-02-20 ENCOUNTER — Ambulatory Visit: Payer: Medicare PPO | Admitting: Allergy and Immunology

## 2016-02-22 ENCOUNTER — Telehealth: Payer: Self-pay | Admitting: Neurology

## 2016-02-22 ENCOUNTER — Ambulatory Visit: Payer: Medicare PPO | Admitting: Neurology

## 2016-02-22 NOTE — Telephone Encounter (Signed)
This patient came in without co-pay for insurance, left without being seen.

## 2016-03-04 ENCOUNTER — Ambulatory Visit: Payer: Medicare PPO | Admitting: Allergy and Immunology

## 2016-03-21 ENCOUNTER — Encounter: Payer: Self-pay | Admitting: Neurology

## 2016-03-21 ENCOUNTER — Ambulatory Visit (INDEPENDENT_AMBULATORY_CARE_PROVIDER_SITE_OTHER): Payer: Medicare PPO | Admitting: Neurology

## 2016-03-21 VITALS — Ht 69.0 in | Wt 156.0 lb

## 2016-03-21 DIAGNOSIS — Z5181 Encounter for therapeutic drug level monitoring: Secondary | ICD-10-CM | POA: Diagnosis not present

## 2016-03-21 DIAGNOSIS — R413 Other amnesia: Secondary | ICD-10-CM

## 2016-03-21 DIAGNOSIS — G43719 Chronic migraine without aura, intractable, without status migrainosus: Secondary | ICD-10-CM | POA: Diagnosis not present

## 2016-03-21 MED ORDER — DONEPEZIL HCL 10 MG PO TABS: 10.0000 mg | ORAL_TABLET | Freq: Every day | ORAL | 6 refills | Status: DC

## 2016-03-21 MED ORDER — DIVALPROEX SODIUM ER 500 MG PO TB24: 500.0000 mg | ORAL_TABLET | Freq: Every day | ORAL | 6 refills | Status: DC

## 2016-03-21 NOTE — Progress Notes (Signed)
Reason for visit:  Headache, memory disturbance  Travis Bryant is an 55 y.o. male  History of present illness:   Travis Bryant is a 55 year old right-handed black male with a history of headaches and a mild memory disturbance. The patient has been placed back on Depakote which seems to help his headaches, he is having only an occasional headache event. He reports ongoing issues with memory, he has not been good about taking his Aricept on a regular basis. He has one event where he could not remember the name of a cousin, but otherwise the memory issues have been stable. The patient is tolerating the medication well. He returns for an evaluation. He is now on disability.  Past Medical History:  Diagnosis Date  . Asthma   . Benign enlargement of prostate   . Bladder disease   . Environmental allergies   . Hematuria   . 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 History of headache   . HIV exposure   . HTN (hypertension)   . Memory change 01/25/2013  . Restless legs syndrome (RLS) 01/14/2014    Past Surgical History:  Procedure Laterality Date  . ABDOMINAL SURGERY    . BRAIN SURGERY      Family History  Problem Relation Age of Onset  . Lupus Mother   . Arthritis/Rheumatoid Mother   . Hypertension Father   . Heart disease Father   . Sarcoidosis Sister   . Cancer Maternal Grandmother   . Heart disease Maternal Grandfather   . Cancer - Prostate Paternal Grandfather     Social history:  reports that he quit smoking about 3 years ago. His smoking use included Cigarettes. He has a 2.00 pack-year smoking history. He has never used smokeless tobacco. He reports that he drinks alcohol. He reports that he does not use drugs.   No Known Allergies  Medications:  Prior to Admission medications   Medication Sig Start Date End Date Taking? Authorizing Provider  acetaminophen (TYLENOL) 325 MG tablet Take 650 mg by mouth every 6 (six) hours as needed (for pain).     Yes Historical Provider, MD  albuterol (PROVENTIL HFA;VENTOLIN HFA) 108 (90 BASE) MCG/ACT inhaler Inhale 2 puffs into the lungs every 6 (six) hours as needed for wheezing or shortness of breath. 07/18/14  Yes Rodolph BongEvan S Corey, MD  amLODipine (NORVASC) 5 MG tablet Take 5 mg by mouth daily. 07/28/15  Yes Historical Provider, MD  beclomethasone (QVAR) 80 MCG/ACT inhaler Inhale 2 puffs into the lungs 2 (two) times daily. 12/27/15  Yes Cristal Fordalph Carter Bobbitt, MD  cyclobenzaprine (FLEXERIL) 5 MG tablet Reported on 12/27/2015 09/15/15  Yes Historical Provider, MD  diclofenac (VOLTAREN) 75 MG EC tablet Take 75 mg by mouth 2 (two) times daily as needed for mild pain.   Yes Historical Provider, MD  divalproex (DEPAKOTE ER) 500 MG 24 hr tablet Take 1 tablet (500 mg total) by mouth daily. 08/22/15  Yes Nilda RiggsNancy Carolyn Martin, NP  donepezil (ARICEPT) 10 MG tablet Take 1 tablet (10 mg total) by mouth daily. 08/22/15  Yes Nilda RiggsNancy Carolyn Martin, NP  fluticasone [facility](FLONASE) 50 MCG/ACT nasal spray Place 2 sprays into both nostrils daily. 12/27/15 12/26/16 Yes Cristal Fordalph Carter Bobbitt, MD  fluticasone (FLOVENT HFA) 220 MCG/ACT inhaler Inhale 2 puffs into the lungs 2 (two) times daily. Use with spacer 12/27/15  Yes Cristal Fordalph Carter Bobbitt, MD  ipratropium (ATROVENT) 0.06 % nasal spray Place 2 sprays into both nostrils  4 (four) times daily. 07/18/14  Yes Rodolph Bong, MD  levocetirizine (XYZAL) 5 MG tablet Take 1 tablet (5 mg total) by mouth every evening. 12/27/15  Yes Cristal Ford, MD  meloxicam (MOBIC) 15 MG tablet take 1 tablet by mouth once daily with food 11/21/15  Yes Historical Provider, MD  methocarbamol (ROBAXIN) 500 MG tablet Take 1-2 tablets (500-1,000 mg total) by mouth every 6 (six) hours as needed for muscle spasms. 01/02/15  Yes Ozella Rocks, MD  mirabegron ER (MYRBETRIQ) 25 MG TB24 tablet Take 25 mg by mouth daily. Reported on 12/27/2015   Yes Historical Provider, MD  omeprazole (PRILOSEC) 40 MG capsule Take 1 capsule (40 mg  total) by mouth daily. 12/14/15  Yes Janetta Hora, PA-C  ranitidine (ZANTAC) 150 MG capsule Take 1 capsule (150 mg total) by mouth daily. 12/09/15  Yes Fayrene Helper, PA-C  sildenafil (REVATIO) 20 MG tablet Take 2-5 tablets by mouth prior to intercouse 12/14/14  Yes Historical Provider, MD  Tamsulosin HCl (FLOMAX) 0.4 MG CAPS Take 0.4 mg by mouth daily.    Yes Historical Provider, MD    ROS:  Out of a complete 14 system review of symptoms, the patient complains only of the following symptoms, and all other reviewed systems are negative.   Frequency of urination  memory disturbance  Height  (1.753 m), weight 156 lb (70.8 kg).  Physical Exam  General: The patient is alert and cooperative at the time of the examination.  Skin: No significant peripheral edema is noted. The right arm and hand is smaller than the left.   Neurologic Exam  Mental status: The patient is alert and oriented x 3 at the time of the examination. The patient has apparent normal recent and remote memory, with an apparently normal attention span and concentration ability. Mini-Mental Status Examination done today shows a total score of 30/30.   Cranial nerves: Facial symmetry is present. Speech is normal, no aphasia or dysarthria is noted. Extraocular movements are full. Visual fields are full.  Motor: The patient has good strength in all 4 extremities.  Sensory examination: Soft touch sensation is symmetric on the face, arms, and legs.  Coordination: The patient has good finger-nose-finger and heel-to-shin bilaterally.  Gait and station: The patient has a mild circumduction gait with the right leg. Tandem gait is unremarkable. Romberg is negative.  Reflexes: Deep tendon reflexes are symmetric.   Assessment/Plan:   1. Mild memory disturbance   2. History of closed head injury   3. History of migraine headache   the patient is doing better with the headaches on Depakote, we will check blood work  today. Prescriptions were called in for the Aricept and for the Depakote, the patient will get back on the Aricept. The patient will follow-up in 6 months, sooner if needed.  Marlan Palau MD 03/21/2016 11:19 AM  [facility] [address] [place], [place] [address]  Phone [phone] Fax [phone]

## 2016-03-22 LAB — CBC WITH DIFFERENTIAL/PLATELET
BASOS: 1 %
Basophils Absolute: 0 10*3/uL (ref 0.0–0.2)
EOS (ABSOLUTE): 0.1 10*3/uL (ref 0.0–0.4)
Eos: 3 %
HEMATOCRIT: 38.5 % (ref 37.5–51.0)
HEMOGLOBIN: 13.4 g/dL (ref 12.6–17.7)
IMMATURE GRANULOCYTES: 0 %
Immature Grans (Abs): 0 10*3/uL (ref 0.0–0.1)
LYMPHS ABS: 1.9 10*3/uL (ref 0.7–3.1)
Lymphs: 49 %
MCH: 30.9 pg (ref 26.6–33.0)
MCHC: 34.8 g/dL (ref 31.5–35.7)
MCV: 89 fL (ref 79–97)
MONOS ABS: 0.4 10*3/uL (ref 0.1–0.9)
Monocytes: 11 %
NEUTROS PCT: 36 %
Neutrophils Absolute: 1.4 10*3/uL (ref 1.4–7.0)
Platelets: 197 10*3/uL (ref 150–379)
RBC: 4.33 x10E6/uL (ref 4.14–5.80)
RDW: 14.7 % (ref 12.3–15.4)
WBC: 3.9 10*3/uL (ref 3.4–10.8)

## 2016-03-22 LAB — COMPREHENSIVE METABOLIC PANEL
A/G RATIO: 1.4 (ref 1.2–2.2)
ALK PHOS: 62 IU/L (ref 39–117)
ALT: 13 IU/L (ref 0–44)
AST: 19 IU/L (ref 0–40)
Albumin: 4.4 g/dL (ref 3.5–5.5)
BUN / CREAT RATIO: 11 (ref 9–20)
BUN: 13 mg/dL (ref 6–24)
Bilirubin Total: 0.4 mg/dL (ref 0.0–1.2)
CALCIUM: 9.1 mg/dL (ref 8.7–10.2)
CO2: 24 mmol/L (ref 18–29)
Chloride: 105 mmol/L (ref 96–106)
Creatinine, Ser: 1.19 mg/dL (ref 0.76–1.27)
GFR calc Af Amer: 78 mL/min/{1.73_m2} (ref >59)
GFR, EST NON AFRICAN AMERICAN: 68 mL/min/{1.73_m2} (ref >59)
GLOBULIN, TOTAL: 3.1 g/dL (ref 1.5–4.5)
Glucose: 95 mg/dL (ref 65–99)
POTASSIUM: 4.7 mmol/L (ref 3.5–5.2)
SODIUM: 143 mmol/L (ref 134–144)
Total Protein: 7.5 g/dL (ref 6.0–8.5)

## 2016-03-22 LAB — AMMONIA: Ammonia: 49 ug/dL (ref 27–102)

## 2016-03-22 LAB — VALPROIC ACID LEVEL

## 2016-03-25 ENCOUNTER — Telehealth: Payer: Self-pay

## 2016-03-25 NOTE — Telephone Encounter (Signed)
-----   Message from York Spanielharles K Willis, MD sent at 03/22/2016  1:48 PM EDT ----- The blood work results are unremarkable. Depakote levels are low, the patient has not been taking the medication. Please call the patient. ----- Message ----- From: Nell RangeInterface, [facility] Lab Results In Sent: 03/22/2016   7:43 AM To: York Spanielharles K Willis, MD

## 2016-03-25 NOTE — Telephone Encounter (Signed)
Called pt w/ unremarkable lab results except for low Depakote level. Verbalized understanding and appreciation for call.

## 2016-04-16 ENCOUNTER — Encounter: Payer: Self-pay | Admitting: Allergy and Immunology

## 2016-04-16 ENCOUNTER — Encounter (INDEPENDENT_AMBULATORY_CARE_PROVIDER_SITE_OTHER): Payer: Self-pay

## 2016-04-16 ENCOUNTER — Ambulatory Visit (INDEPENDENT_AMBULATORY_CARE_PROVIDER_SITE_OTHER): Payer: Medicare PPO | Admitting: Allergy and Immunology

## 2016-04-16 DIAGNOSIS — J4530 Mild persistent asthma, uncomplicated: Secondary | ICD-10-CM

## 2016-04-16 DIAGNOSIS — K219 Gastro-esophageal reflux disease without esophagitis: Secondary | ICD-10-CM

## 2016-04-16 DIAGNOSIS — J3089 Other allergic rhinitis: Secondary | ICD-10-CM

## 2016-04-16 NOTE — Assessment & Plan Note (Signed)
   Continue appropriate reflux lifestyle modifications and omeprazole as prescribed plus/minus ranitidine as needed.

## 2016-04-16 NOTE — Assessment & Plan Note (Signed)
   Qvar 80 g, one inhalation via spacer device twice a day.  During respiratory tract infections or asthma flares, increase Qvar 80 g  to 3 inhalations 2 times per day until symptoms have returned to baseline.   Continue albuterol HFA, 1-2 inhalations every 4-6 hours as needed.  Subjective and objective measures of pulmonary function will be followed and the treatment plan will be adjusted accordingly.

## 2016-04-16 NOTE — Assessment & Plan Note (Signed)
Stable.  Continue appropriate allergen avoidance measures, levocetirizine as needed, and fluticasone nasal spray as needed. 

## 2016-04-16 NOTE — Progress Notes (Signed)
Follow-up Note  RE: Malcolm MetroKeith L Yott MRN: [id number] DOB: 02/25/1961 Date of Office Visit: 04/16/2016  Primary care provider: Jonny RuizLlibre, Giovanni, MD Referring provider: Jonny RuizLlibre, Giovanni, MD  History of present illness: Travis Bryant is a 55 y.o. male with persistent asthma, allergic rhinitis, and gas esophageal reflux disease presenting today for follow up.  He was last seen in this clinic on 12/27/2015.  He reports that in the interval since previous visit his upper and lower respiratory symptoms have been well-controlled. Over the past month he has not required asthma rescue medication, experienced nocturnal awakenings due to lower respiratory symptoms, nor have activities of daily living been limited.  He states that his asthma and allergy symptoms are typically most active in the springtime/early summer and relatively quiescent the rest of the year.   Assessment and plan: Mild persistent asthma  Qvar 80 g, one inhalation via spacer device twice a day.  During respiratory tract infections or asthma flares, increase Qvar 80 g   to 3 inhalations 2 times per day until symptoms have returned to baseline.   Continue albuterol HFA, 1-2 inhalations every 4-6 hours as needed.  Subjective and objective measures of pulmonary function will be followed and the treatment plan will be adjusted accordingly.  Seasonal and perennial allergic rhinitis Stable.  Continue appropriate allergen avoidance measures, levocetirizine as needed, and fluticasone nasal spray as needed.  GERD (gastroesophageal reflux disease)  Continue appropriate reflux lifestyle modifications and omeprazole as prescribed plus/minus ranitidine as needed.  Diagnositics: Spirometry:  Normal with an FEV1 of 112% predicted.  Please see scanned spirometry results for details.    Physical examination: Blood pressure 124/78, pulse 92, resp. rate 16.  General: Alert, interactive, in no acute distress. HEENT: TMs pearly gray,  turbinates minimally edematous without discharge, post-pharynx unremarkable. Neck: Supple without lymphadenopathy. Lungs: Clear to auscultation without wheezing, rhonchi or rales. CV: Normal S1, S2 without murmurs. Skin: Warm and dry, without lesions or rashes.  The following portions of the patient's history were reviewed and updated as appropriate: allergies, current medications, past family history, past medical history, past social history, past surgical history and problem list.    Medication List       Accurate as of 04/16/16  1:48 PM. Always use your most recent med list.          acetaminophen 325 MG tablet Commonly known as:  TYLENOL Take 650 mg by mouth every 6 (six) hours as needed (for pain).   albuterol 108 (90 Base) MCG/ACT inhaler Commonly known as:  PROVENTIL HFA;VENTOLIN HFA Inhale 2 puffs into the lungs every 6 (six) hours as needed for wheezing or shortness of breath.   amLODipine 5 MG tablet Commonly known as:  NORVASC Take 5 mg by mouth daily.   beclomethasone 80 MCG/ACT inhaler Commonly known as:  QVAR Inhale 2 puffs into the lungs 2 (two) times daily.   cyclobenzaprine 5 MG tablet Commonly known as:  FLEXERIL Reported on 12/27/2015   diclofenac 75 MG EC tablet Commonly known as:  VOLTAREN Take 75 mg by mouth 2 (two) times daily as needed for mild pain.   divalproex 500 MG 24 hr tablet Commonly known as:  DEPAKOTE ER Take 1 tablet (500 mg total) by mouth daily.   donepezil 10 MG tablet Commonly known as:  ARICEPT Take 1 tablet (10 mg total) by mouth daily.   fluticasone 220 MCG/ACT inhaler Commonly known as:  FLOVENT HFA Inhale 2 puffs into the lungs 2 (two) times daily.  Use with spacer   fluticasone 50 MCG/ACT nasal spray Commonly known as:  FLONASE Place 2 sprays into both nostrils daily.   ipratropium 0.06 % nasal spray Commonly known as:  ATROVENT Place 2 sprays into both nostrils 4 (four) times daily.   levocetirizine 5 MG  tablet Commonly known as:  XYZAL Take 1 tablet (5 mg total) by mouth every evening.   meloxicam 15 MG tablet Commonly known as:  MOBIC take 1 tablet by mouth once daily with food   methocarbamol 500 MG tablet Commonly known as:  ROBAXIN Take 1-2 tablets (500-1,000 mg total) by mouth every 6 (six) hours as needed for muscle spasms.   mirabegron ER 25 MG Tb24 tablet Commonly known as:  MYRBETRIQ Take 25 mg by mouth daily. Reported on 12/27/2015   omeprazole 40 MG capsule Commonly known as:  PRILOSEC Take 1 capsule (40 mg total) by mouth daily.   ranitidine 150 MG capsule Commonly known as:  ZANTAC Take 1 capsule (150 mg total) by mouth daily.   sildenafil 20 MG tablet Commonly known as:  REVATIO Take 2-5 tablets by mouth prior to intercouse   tamsulosin 0.4 MG Caps capsule Commonly known as:  FLOMAX Take 0.4 mg by mouth daily.       No Known Allergies  I appreciate the opportunity to take part in [place] care. Please do not hesitate to contact me with questions.  Sincerely,   R. Jorene Guest, MD

## 2016-04-16 NOTE — Addendum Note (Signed)
Addended by: Clifton JamesLARK, Cesiah Westley L on: 04/16/2016 06:27 PM   Modules accepted: Orders

## 2016-04-16 NOTE — Patient Instructions (Addendum)
Mild persistent asthma  Qvar 80 g, one inhalation via spacer device twice a day.  During respiratory tract infections or asthma flares, increase Qvar 80 g   to 3 inhalations 2 times per day until symptoms have returned to baseline.   Continue albuterol HFA, 1-2 inhalations every 4-6 hours as needed.  Subjective and objective measures of pulmonary function will be followed and the treatment plan will be adjusted accordingly.  Seasonal and perennial allergic rhinitis Stable.  Continue appropriate allergen avoidance measures, levocetirizine as needed, and fluticasone nasal spray as needed.  GERD (gastroesophageal reflux disease)  Continue appropriate reflux lifestyle modifications and omeprazole as prescribed plus/minus ranitidine as needed.   Return in about 6 months (around 10/14/2016), or if symptoms worsen or fail to improve.

## 2016-05-23 ENCOUNTER — Encounter: Payer: Self-pay | Admitting: Cardiovascular Disease

## 2016-05-23 ENCOUNTER — Ambulatory Visit (INDEPENDENT_AMBULATORY_CARE_PROVIDER_SITE_OTHER): Payer: Medicare PPO | Admitting: Cardiovascular Disease

## 2016-05-23 ENCOUNTER — Encounter (INDEPENDENT_AMBULATORY_CARE_PROVIDER_SITE_OTHER): Payer: Self-pay

## 2016-05-23 VITALS — BP 128/88 | HR 96 | Ht 69.0 in | Wt 159.0 lb

## 2016-05-23 DIAGNOSIS — R0789 Other chest pain: Secondary | ICD-10-CM | POA: Diagnosis not present

## 2016-05-23 NOTE — Patient Instructions (Signed)

## 2016-05-23 NOTE — Progress Notes (Signed)
Chief Complaint  Patient presents with  . Follow-up   History of Present Illness: 55 yo male with history of HTN, asthma, remote tobacco abuse, enlarged prostate here today for cardiac follow up. I saw him as a new patient 10/12/14 for evaluation of chest pain and SOB. He told me that three weeks prior to his first appointment with me he he woke up with pain in his chest associated with SOB. This lasted for several minutes. He had no exertional chest pain. He denied any lower extremity edema, PND, orthopnea. I arranged an echo and an exercise stress test. The exercise stress test showed no ischemic EKG changes. Echo 10/17/14 with normal LV size and function, no significant valve disease. He was seen in the ED 12/06/15 for chest pain. He ruled out for MI. Chest pain relieved with a GI cocktail. He was seen in our office 12/14/15 by Carlean Jews, PA-C and his chest pain had resolved.   He is here today for follow up. No recurrent chest pain. Feeling well overall.   Primary Care Physician: Jonny Ruiz, MD    Past Medical History:  Diagnosis Date  . Asthma   . Benign enlargement of prostate   . Bladder disease   . Environmental allergies   . Hematuria   . 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 History of headache   . HIV exposure   . HTN (hypertension)   . Memory change 01/25/2013  . Restless legs syndrome (RLS) 01/14/2014    Past Surgical History:  Procedure Laterality Date  . ABDOMINAL SURGERY    . BRAIN SURGERY      Current Outpatient Prescriptions  Medication Sig Dispense Refill  . acetaminophen (TYLENOL) 325 MG tablet Take 650 mg by mouth every 6 (six) hours as needed (for pain).     Marland Kitchen albuterol (PROVENTIL HFA;VENTOLIN HFA) 108 (90 BASE) MCG/ACT inhaler Inhale 2 puffs into the lungs every 6 (six) hours as needed for wheezing or shortness of breath. 1 Inhaler 2  . amLODipine (NORVASC) 5 MG tablet Take 5 mg by mouth daily.  0  . beclomethasone (QVAR)  80 MCG/ACT inhaler Inhale 2 puffs into the lungs 2 (two) times daily. (Patient taking differently: Inhale 2 puffs into the lungs as needed. ) 1 Inhaler 5  . cyclobenzaprine (FLEXERIL) 5 MG tablet Reported on 12/27/2015  0  . diclofenac (VOLTAREN) 75 MG EC tablet Take 75 mg by mouth 2 (two) times daily as needed for mild pain.    . divalproex (DEPAKOTE ER) 500 MG 24 hr tablet Take 1 tablet (500 mg total) by mouth daily. 30 tablet 6  . donepezil (ARICEPT) 10 MG tablet Take 1 tablet (10 mg total) by mouth daily. 30 tablet 6  . fluticasone (FLONASE) 50 MCG/ACT nasal spray Place 2 sprays into both nostrils daily. 16 g 5  . fluticasone (FLOVENT HFA) 220 MCG/ACT inhaler Inhale 2 puffs into the lungs 2 (two) times daily. Use with spacer 1 Inhaler 5  . ipratropium (ATROVENT) 0.06 % nasal spray Place 2 sprays into both nostrils 4 (four) times daily. 15 mL 1  . levocetirizine (XYZAL) 5 MG tablet Take 1 tablet (5 mg total) by mouth every evening. 30 tablet 5  . meloxicam (MOBIC) 15 MG tablet take 1 tablet by mouth once daily with food  0  . methocarbamol (ROBAXIN) 500 MG tablet Take 1-2 tablets (500-1,000 mg total) by mouth every 6 (six) hours as needed for  muscle spasms. 60 tablet 0  . mirabegron ER (MYRBETRIQ) 25 MG TB24 tablet Take 25 mg by mouth daily. Reported on 12/27/2015    . omeprazole (PRILOSEC) 40 MG capsule Take 1 capsule (40 mg total) by mouth daily. 30 capsule 11  . ranitidine (ZANTAC) 150 MG capsule Take 1 capsule (150 mg total) by mouth daily. 30 capsule 0  . sildenafil (REVATIO) 20 MG tablet Take 2-5 tablets by mouth prior to intercouse    . Tamsulosin HCl (FLOMAX) 0.4 MG CAPS Take 0.4 mg by mouth daily.      Current Facility-Administered Medications  Medication Dose Route Frequency Provider Last Rate Last Dose  . methylPREDNISolone acetate (DEPO-MEDROL) injection 80 mg  80 mg Intramuscular Once Cristal Fordalph Carter Bobbitt, MD      . predniSONE (DELTASONE) tablet 10 mg  10 mg Oral UD Cristal Fordalph Carter  Bobbitt, MD        No Known Allergies  Social History   Social History  . Marital status: Single    Spouse name: N/A  . Number of children: 0  . Years of education: college   Occupational History  . [employer]-part time [employer]   Social History Main Topics  . Smoking status: Former Smoker    Packs/day: 0.10    Years: 20.00    Types: Cigarettes    Quit date: 10/11/2012  . Smokeless tobacco: Never Used  . Alcohol use 0.0 oz/week     Comment: socially  . Drug use: No  . Sexual activity: Not on file   Other Topics Concern  . Not on file   Social History Narrative   Patient is left handed.   Patient drinks 3 glasses caffeine daily    Family History  Problem Relation Age of Onset  . Lupus Mother   . Arthritis/Rheumatoid Mother   . Hypertension Father   . Heart disease Father   . Sarcoidosis Sister   . Cancer Maternal Grandmother   . Heart disease Maternal Grandfather   . Cancer - Prostate Paternal Grandfather     Review of Systems:  As stated in the HPI and otherwise negative.   BP 128/88   Pulse 96   Ht 5\' 9"  (1.753 m)   Wt 159 lb (72.1 kg)   BMI 23.48 kg/m   Physical Examination: General: Well developed, well nourished, NAD  HEENT: OP clear, mucus membranes moist  SKIN: warm, dry. No rashes. Neuro: No focal deficits  Musculoskeletal: Muscle strength 5/5 all ext  Psychiatric: Mood and affect normal  Neck: No JVD, no carotid bruits, no thyromegaly, no lymphadenopathy.  Lungs:Clear bilaterally, no wheezes, rhonci, crackles Cardiovascular: Regular rate and rhythm. No murmurs, gallops or rubs. Abdomen:Soft. Bowel sounds present. Non-tender.  Extremities: No lower extremity edema. Pulses are 2 + in the bilateral DP/PT.  Echo 10/17/14:  Left ventricle: The cavity size was normal. Wall thickness was normal. Systolic function was vigorous. The estimated ejection fraction was in the range of 65% to 70%. Wall motion was normal; there were no  regional wall motion abnormalities. Left ventricular diastolic function parameters were normal. - Mitral valve: Mildly thickened leaflets . There is a partially flail subvalvular cord attached to the anterior leaflet. This does not result in any prolpase of the anterior leaflet. There was trivial regurgitation. - Left atrium: The atrium was normal in size. - Tricuspid valve: There was mild regurgitation. - Pulmonary arteries: PA peak pressure: 34 mm Hg (S). - Inferior vena cava: The vessel was normal in size.  The respirophasic diameter changes were in the normal range (>= 50%), consistent with normal central venous pressure. Impressions: - LVEF 65-70%, normal wall thickness, normal wall motion, normal diastolic function, thickened mitral leaflets with a partially flail anterior cord, no signficant prolapse or MR is noted, mild TR, top normal RVSP.  EKG:  EKG is not  ordered today. The ekg ordered today demonstrates   Recent Labs: 12/09/2015: Hemoglobin 12.9 03/21/2016: ALT 13; BUN 13; Creatinine, Ser 1.19; Platelets 197; Potassium 4.7; Sodium 143   Lipid Panel No results found for: CHOL, TRIG, HDL, CHOLHDL, VLDL, LDLCALC, LDLDIRECT   Wt Readings from Last 3 Encounters:  05/23/16 159 lb (72.1 kg)  03/21/16 156 lb (70.8 kg)  12/27/15 153 lb 9.6 oz (69.7 kg)     Other studies Reviewed: Additional studies/ records that were reviewed today include: . Review of the above records demonstrates:    Assessment and Plan:   1. Chest pain/Dyspnea: Stress test without ischemia. LV function is normal on echo.  Chest pain resolved. No further ischemic evaluation.   2. Tobacco use, in remission: He stopped smoking. He only smoked occasionally with friends.  Current medicines are reviewed at length with the patient today.  The patient does not have concerns regarding medicines.  The following changes have been made:  no change  Labs/ tests ordered today include:  No  orders of the defined types were placed in this encounter.  Disposition:   FU with me 12 months.     Signed, Verne Carrow, MD 05/23/2016 12:33 PM    [facility] [address] [place], [place], [place]  [address] Phone: [phone]; Fax: [phone]

## 2016-07-30 DIAGNOSIS — Z87828 Personal history of other (healed) physical injury and trauma: Secondary | ICD-10-CM | POA: Insufficient documentation

## 2016-08-22 DIAGNOSIS — N329 Bladder disorder, unspecified: Secondary | ICD-10-CM | POA: Insufficient documentation

## 2016-08-22 DIAGNOSIS — Z87448 Personal history of other diseases of urinary system: Secondary | ICD-10-CM | POA: Insufficient documentation

## 2016-08-22 DIAGNOSIS — T7589XA Other specified effects of external causes, initial encounter: Secondary | ICD-10-CM | POA: Insufficient documentation

## 2016-08-22 DIAGNOSIS — N529 Male erectile dysfunction, unspecified: Secondary | ICD-10-CM | POA: Insufficient documentation

## 2016-08-22 DIAGNOSIS — R350 Frequency of micturition: Secondary | ICD-10-CM | POA: Insufficient documentation

## 2016-09-12 ENCOUNTER — Ambulatory Visit: Payer: Medicare PPO | Admitting: Nurse Practitioner

## 2016-09-23 ENCOUNTER — Ambulatory Visit: Payer: Medicare PPO | Admitting: Nurse Practitioner

## 2016-10-14 ENCOUNTER — Ambulatory Visit: Payer: Medicare PPO | Admitting: Allergy and Immunology

## 2016-10-15 ENCOUNTER — Encounter: Payer: Self-pay | Admitting: Allergy and Immunology

## 2016-10-15 ENCOUNTER — Ambulatory Visit (INDEPENDENT_AMBULATORY_CARE_PROVIDER_SITE_OTHER): Payer: Medicare PPO | Admitting: Allergy and Immunology

## 2016-10-15 VITALS — BP 134/80 | HR 90 | Temp 97.6°F | Resp 16 | Ht 68.5 in | Wt 160.0 lb

## 2016-10-15 DIAGNOSIS — J3089 Other allergic rhinitis: Secondary | ICD-10-CM

## 2016-10-15 DIAGNOSIS — K219 Gastro-esophageal reflux disease without esophagitis: Secondary | ICD-10-CM | POA: Diagnosis not present

## 2016-10-15 DIAGNOSIS — J4530 Mild persistent asthma, uncomplicated: Secondary | ICD-10-CM | POA: Diagnosis not present

## 2016-10-15 MED ORDER — FLUTICASONE PROPIONATE HFA 220 MCG/ACT IN AERO: 1.0000 | INHALATION_SPRAY | Freq: Two times a day (BID) | RESPIRATORY_TRACT | 5 refills | Status: DC

## 2016-10-15 MED ORDER — ALBUTEROL SULFATE HFA 108 (90 BASE) MCG/ACT IN AERS: 2.0000 | INHALATION_SPRAY | Freq: Four times a day (QID) | RESPIRATORY_TRACT | 1 refills | Status: DC | PRN

## 2016-10-15 MED ORDER — LEVOCETIRIZINE DIHYDROCHLORIDE 5 MG PO TABS: 5.0000 mg | ORAL_TABLET | Freq: Every evening | ORAL | 5 refills | Status: DC

## 2016-10-15 NOTE — Patient Instructions (Addendum)
Mild persistent asthma As his insurance no longer covers Qvar, he will switch to Flovent.  Flovent 220 g, one inhalation twice a day.  A prescription has been provided.  To maximize pulmonary deposition, a spacer has been provided along with instructions for its proper administration with an HFA inhaler.  Continue albuterol HFA, 1-2 inhalations every 4-6 hours as needed.  Subjective and objective measures of pulmonary function will be followed and the treatment plan will be adjusted accordingly.  Seasonal and perennial allergic rhinitis Stable.  Continue allergen avoidance measures, levocetirizine as needed, and fluticasone nasal spray as needed.  GERD (gastroesophageal reflux disease)  Continue appropriate reflux lifestyle modifications and omeprazole as prescribed plus/minus ranitidine as needed.   Return in about 4 months (around 02/14/2017), or if symptoms worsen or fail to improve.

## 2016-10-15 NOTE — Progress Notes (Signed)
Follow-up Note  RE: Travis Bryant MRN: [id number] DOB: [birth year] Date of Office Visit: 10/15/2016  Primary care provider: Jonny Ruiz, MD Referring provider: Jonny Ruiz, MD  History of present illness: Travis Bryant is a 56 y.o. male with persistent asthma, allergic rhinitis, and gastroesophageal reflux presenting today for follow up.  He was last seen in this clinic in September 2017.  He reports that he experiences a mild flare of asthma earlier this week.  He reports that he has been taking Qvar sporadically and has lost his spacer device.  He has no nasal symptom complaints today.   Assessment and plan: Mild persistent asthma As his insurance no longer covers Qvar, he will switch to Flovent.  Flovent 220 g, one inhalation twice a day.  A prescription has been provided.  To maximize pulmonary deposition, a spacer has been provided along with instructions for its proper administration with an HFA inhaler.  Continue albuterol HFA, 1-2 inhalations every 4-6 hours as needed.  Subjective and objective measures of pulmonary function will be followed and the treatment plan will be adjusted accordingly.  Seasonal and perennial allergic rhinitis Stable.  Continue allergen avoidance measures, levocetirizine as needed, and fluticasone nasal spray as needed.  GERD (gastroesophageal reflux disease)  Continue appropriate reflux lifestyle modifications and omeprazole as prescribed plus/minus ranitidine as needed.   Meds ordered this encounter  Medications  . fluticasone (FLOVENT HFA) 220 MCG/ACT inhaler    Sig: Inhale 1 puff into the lungs 2 (two) times daily.    Dispense:  1 Inhaler    Refill:  5  . albuterol (PROVENTIL HFA;VENTOLIN HFA) 108 (90 Base) MCG/ACT inhaler    Sig: Inhale 2 puffs into the lungs every 6 (six) hours as needed for wheezing or shortness of breath.    Dispense:  1 Inhaler    Refill:  1  . levocetirizine (XYZAL) 5 MG tablet    Sig: Take 1 tablet  (5 mg total) by mouth every evening.    Dispense:  30 tablet    Refill:  5    Diagnostics: Spirometry:  Normal with an FEV1 of 114% predicted.  Please see scanned spirometry results for details.    Physical examination: Blood pressure 134/80, pulse 90, temperature 97.6 F (36.4 C), temperature source Oral, resp. rate 16, height 5' 8.5" (1.74 m), weight 160 lb (72.6 kg), SpO2 95 %.  General: Alert, interactive, in no acute distress. HEENT: TMs pearly gray, turbinates minimally edematous without discharge, post-pharynx unremarkable. Neck: Supple without lymphadenopathy. Lungs: Clear to auscultation without wheezing, rhonchi or rales. CV: Normal S1, S2 without murmurs. Skin: Warm and dry, without lesions or rashes.  The following portions of the patient's history were reviewed and updated as appropriate: allergies, current medications, past family history, past medical history, past social history, past surgical history and problem list.  Allergies as of 10/15/2016   No Known Allergies     Medication List       Accurate as of 10/15/16 12:15 PM. Always use your most recent med list.          acetaminophen 325 MG tablet Commonly known as:  TYLENOL Take 650 mg by mouth every 6 (six) hours as needed (for pain).   albuterol 108 (90 Base) MCG/ACT inhaler Commonly known as:  PROVENTIL HFA;VENTOLIN HFA Inhale 2 puffs into the lungs every 6 (six) hours as needed for wheezing or shortness of breath.   amLODipine 5 MG tablet Commonly known as:  NORVASC Take 5 mg  by mouth daily.   beclomethasone 80 MCG/ACT inhaler Commonly known as:  QVAR Inhale 2 puffs into the lungs 2 (two) times daily.   cyclobenzaprine 5 MG tablet Commonly known as:  FLEXERIL Reported on 12/27/2015   diclofenac 75 MG EC tablet Commonly known as:  VOLTAREN Take 75 mg by mouth 2 (two) times daily as needed for mild pain.   divalproex 500 MG 24 hr tablet Commonly known as:  DEPAKOTE ER Take 1 tablet (500 mg  total) by mouth daily.   donepezil 10 MG tablet Commonly known as:  ARICEPT Take 1 tablet (10 mg total) by mouth daily.   fluticasone 220 MCG/ACT inhaler Commonly known as:  FLOVENT HFA Inhale 1 puff into the lungs 2 (two) times daily.   fluticasone 50 MCG/ACT nasal spray Commonly known as:  FLONASE Place 2 sprays into both nostrils daily.   ipratropium 0.06 % nasal spray Commonly known as:  ATROVENT Place 2 sprays into both nostrils 4 (four) times daily.   levocetirizine 5 MG tablet Commonly known as:  XYZAL Take 1 tablet (5 mg total) by mouth every evening.   meloxicam 15 MG tablet Commonly known as:  MOBIC take 1 tablet by mouth once daily with food   mirabegron ER 25 MG Tb24 tablet Commonly known as:  MYRBETRIQ Take 25 mg by mouth daily. Reported on 12/27/2015   omeprazole 40 MG capsule Commonly known as:  PRILOSEC Take 1 capsule (40 mg total) by mouth daily.   ranitidine 150 MG capsule Commonly known as:  ZANTAC Take 1 capsule (150 mg total) by mouth daily.   sildenafil 20 MG tablet Commonly known as:  REVATIO Take 2-5 tablets by mouth prior to intercouse   tamsulosin 0.4 MG Caps capsule Commonly known as:  FLOMAX take 2 tabs by mouth daily       No Known Allergies  I appreciate the opportunity to take part in [name]'s care. Please do not hesitate to contact me with questions.  Sincerely,   R. Jorene Guestarter Leanette Eutsler, MD

## 2016-10-15 NOTE — Assessment & Plan Note (Addendum)
As his insurance no longer covers Qvar, he will switch to [employer]ovent.  Flovent 220 g, one inhalation twice a day.  A prescription has been provided.  To maximize pulmonary deposition, a spacer has been provided along with instructions for its proper administration with an HFA inhaler.  Continue albuterol HFA, 1-2 inhalations every 4-6 hours as needed.  Subjective and objective measures of pulmonary function will be followed and the treatment plan will be adjusted accordingly.

## 2016-10-15 NOTE — Assessment & Plan Note (Signed)
   Continue appropriate reflux lifestyle modifications and omeprazole as prescribed plus/minus ranitidine as needed.

## 2016-10-15 NOTE — Assessment & Plan Note (Signed)
Stable.  Continue allergen avoidance measures, levocetirizine as needed, and fluticasone nasal spray as needed.

## 2016-11-06 ENCOUNTER — Encounter: Payer: Self-pay | Admitting: Nurse Practitioner

## 2016-11-06 ENCOUNTER — Ambulatory Visit (INDEPENDENT_AMBULATORY_CARE_PROVIDER_SITE_OTHER): Payer: Medicare PPO | Admitting: Nurse Practitioner

## 2016-11-06 VITALS — BP 114/73 | HR 107 | Ht 68.5 in | Wt 161.6 lb

## 2016-11-06 DIAGNOSIS — G43719 Chronic migraine without aura, intractable, without status migrainosus: Secondary | ICD-10-CM

## 2016-11-06 DIAGNOSIS — R413 Other amnesia: Secondary | ICD-10-CM | POA: Diagnosis not present

## 2016-11-06 DIAGNOSIS — S0990XS Unspecified injury of head, sequela: Secondary | ICD-10-CM

## 2016-11-06 MED ORDER — DONEPEZIL HCL 10 MG PO TABS: 10.0000 mg | ORAL_TABLET | Freq: Every day | ORAL | 6 refills | Status: DC

## 2016-11-06 NOTE — Progress Notes (Signed)
[facility]  PATIENT: Travis Bryant DOB: Aug 24, 1960   REASON FOR VISIT: Follow-up for migraine, mild cognitive impairment HISTORY FROM: Patient    HISTORY OF PRESENT ILLNESS:Travis Bryant is a 56 year old right-handed black male with a history of headaches and a mild memory disturbance. He also has a history of closed head injury. The patient was  placed back on Depakote when last seen by [name] which had helped his headaches in the past however he is only having occasional headache relieved with Tylenol and he is no longer taking Depakote . He reports ongoing issues with memory, he has not been good about taking his Aricept on a regular basis and he admits to not taking it on a regular basis now, but otherwise the memory issues have been stable. The patient is tolerating the medication well without GI side effects. He returns for an evaluation. He is now on disability.   REVIEW OF SYSTEMS: Full 14 system review of systems performed and notable only for those listed, all others are neg:  Constitutional: neg  Cardiovascular: neg Ear/Nose/Throat: neg  Skin: neg Eyes: neg Respiratory: neg Gastroitestinal: neg  Hematology/Lymphatic: neg  Endocrine: neg Musculoskeletal:neg Allergy/Immunology: neg Neurological: neg Psychiatric: neg Sleep : neg   ALLERGIES: No Known Allergies  HOME MEDICATIONS: Outpatient Medications Prior to Visit  Medication Sig Dispense Refill  . acetaminophen (TYLENOL) 325 MG tablet Take 650 mg by mouth every 6 (six) hours as needed (for pain).     Marland Kitchen albuterol (PROVENTIL HFA;VENTOLIN HFA) 108 (90 Base) MCG/ACT inhaler Inhale 2 puffs into the lungs every 6 (six) hours as needed for wheezing or shortness of breath. 1 Inhaler 1  . amLODipine (NORVASC) 5 MG tablet Take 5 mg by mouth daily.  0  . beclomethasone (QVAR) 80 MCG/ACT inhaler Inhale 2 puffs into the lungs 2 (two) times daily. (Patient taking differently: Inhale 2 puffs into the lungs  as needed. ) 1 Inhaler 5  . cyclobenzaprine (FLEXERIL) 5 MG tablet Reported on 12/27/2015  0  . diclofenac (VOLTAREN) 75 MG EC tablet Take 75 mg by mouth 2 (two) times daily as needed for mild pain.    . divalproex (DEPAKOTE ER) 500 MG 24 hr tablet Take 1 tablet (500 mg total) by mouth daily. 30 tablet 6  . fluticasone (FLONASE) 50 MCG/ACT nasal spray Place 2 sprays into both nostrils daily. 16 g 5  . fluticasone (FLOVENT HFA) 220 MCG/ACT inhaler Inhale 1 puff into the lungs 2 (two) times daily. 1 Inhaler 5  . ipratropium (ATROVENT) 0.06 % nasal spray Place 2 sprays into both nostrils 4 (four) times daily. 15 mL 1  . levocetirizine (XYZAL) 5 MG tablet Take 1 tablet (5 mg total) by mouth every evening. 30 tablet 5  . meloxicam (MOBIC) 15 MG tablet take 1 tablet by mouth once daily with food  0  . mirabegron ER (MYRBETRIQ) 25 MG TB24 tablet Take 25 mg by mouth daily. Reported on 12/27/2015    . omeprazole (PRILOSEC) 40 MG capsule Take 1 capsule (40 mg total) by mouth daily. 30 capsule 11  . ranitidine (ZANTAC) 150 MG capsule Take 1 capsule (150 mg total) by mouth daily. 30 capsule 0  . sildenafil (REVATIO) 20 MG tablet Take 2-5 tablets by mouth prior to intercouse    . tamsulosin (FLOMAX) 0.4 MG CAPS capsule take 2 tabs by mouth daily    . donepezil (ARICEPT) 10 MG tablet Take 1 tablet (10 mg total) by mouth daily. (Patient not  taking: Reported on 11/06/2016) 30 tablet 6   Facility-Administered Medications Prior to Visit  Medication Dose Route Frequency Provider Last Rate Last Dose  . methylPREDNISolone acetate (DEPO-MEDROL) injection 80 mg  80 mg Intramuscular Once Cristal Fordalph Carter Bobbitt, MD      . predniSONE (DELTASONE) tablet 10 mg  10 mg Oral UD Cristal Fordalph Carter Bobbitt, MD        PAST MEDICAL HISTORY: Past Medical History:  Diagnosis Date  . Asthma   . Benign enlargement of prostate   . Bladder disease   . Environmental allergies   . Hematuria   . 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 History of headache   . HIV exposure   . HTN (hypertension)   . Memory change 01/25/2013  . Restless legs syndrome (RLS) 01/14/2014    PAST SURGICAL HISTORY: Past Surgical History:  Procedure Laterality Date  . ABDOMINAL SURGERY    . BRAIN SURGERY      FAMILY HISTORY: Family History  Problem Relation Age of Onset  . Lupus Mother   . Arthritis/Rheumatoid Mother   . Hypertension Father   . Heart disease Father   . Sarcoidosis Sister   . Cancer Maternal Grandmother   . Heart disease Maternal Grandfather   . Cancer - Prostate Paternal Grandfather     SOCIAL HISTORY: Social History   Social History  . Marital status: Single    Spouse name: N/A  . Number of children: 0  . Years of education: college   Occupational History  . [employer]-part time [employer]   Social History Main Topics  . Smoking status: Former Smoker    Packs/day: 0.10    Years: 20.00    Types: Cigarettes    Quit date: 10/11/2012  . Smokeless tobacco: Never Used  . Alcohol use 0.0 oz/week     Comment: socially  . Drug use: No  . Sexual activity: Not on file   Other Topics Concern  . Not on file   Social History Narrative   Patient is left handed.   Patient drinks 3 glasses caffeine daily     PHYSICAL EXAM  Vitals:   11/06/16 1440  BP: 114/73  Pulse: (!) 107  Weight: 161 lb 9.6 oz (73.3 kg)  Height: 5' 8.5" (1.74 m)   Body mass index is 24.21 kg/m.  Generalized: Well developed, in no acute distress  Head: normocephalic and atraumatic,. Oropharynx benign  Neck: Supple, no carotid bruits  Cardiac: Regular rate rhythm, no murmur  Musculoskeletal: Right arm and hand is smaller than the left  Neurological examination   Mentation: Alert  MMSE - Mini Mental State Exam 11/06/2016 03/21/2016 08/22/2015  Orientation to time 5 5 5   Orientation to Place 5 5 5   Registration 3 3 3   Attention/ Calculation 4 5 5   Recall 1 3 0  Language- name 2 objects 2 2  2   Language- repeat 1 1 1   Language- follow 3 step command 3 3 3   Language- read & follow direction 1 1 1   Write a sentence 1 1 1   Copy design 1 1 1   Total score 27 30 27     Follows all commands speech and language fluent. AFT 7. Clock drawing 4/4  Cranial nerve II-XII: .Pupils were equal round reactive to light extraocular movements were full, visual field were full on confrontational test. Facial sensation and strength were normal. hearing was intact to finger rubbing bilaterally. Uvula tongue midline. head  turning and shoulder shrug were normal and symmetric.Tongue protrusion into cheek strength was normal. Motor: normal bulk and tone, full strength in the BUE, BLE, fine finger movements normal, no pronator drift. No focal weakness Sensory: normal and symmetric to light touch, pinprick, and  Vibration, in the upper and lower extremities Coordination: finger-nose-finger, heel-to-shin bilaterally, no dysmetria Reflexes: Brachioradialis 2/2, biceps 2/2, triceps 2/2, patellar 2/2, Achilles 2/2, plantar responses were flexor bilaterally. Gait and Station: Mild circumduction gait with the right leg tandem gait unremarkable Romberg is negative  DIAGNOSTIC DATA (LABS, IMAGING, TESTING) - I reviewed patient records, labs, notes, testing and imaging myself where available.  Lab Results  Component Value Date   WBC 3.9 03/21/2016   HGB 12.9 (L) 12/09/2015   HCT 38.5 03/21/2016   MCV 89 03/21/2016   PLT 197 03/21/2016      Component Value Date/Time   NA 143 03/21/2016 1156   K 4.7 03/21/2016 1156   CL 105 03/21/2016 1156   CO2 24 03/21/2016 1156   GLUCOSE 95 03/21/2016 1156   GLUCOSE 116 (H) 12/09/2015 1258   BUN 13 03/21/2016 1156   CREATININE 1.19 03/21/2016 1156   CALCIUM 9.1 03/21/2016 1156   PROT 7.5 03/21/2016 1156   ALBUMIN 4.4 03/21/2016 1156   AST 19 03/21/2016 1156   ALT 13 03/21/2016 1156   ALKPHOS 62 03/21/2016 1156   BILITOT 0.4 03/21/2016 1156   GFRNONAA 68 03/21/2016  1156   GFRAA 78 03/21/2016 1156        ASSESSMENT AND PLAN  56 y.o. year old male  has a past medical history of  1. Mild memory disturbance 2. History of closed head injury 3. History of migraine headache   The patient is doing better with the headaches and has stopped the Depakote. He is using Tylenol with relief. He feels his memory is stable on Aricept however he doesn't always take the medication as prescribed. His Aricept prescription will be renewed. I spent 25 min in total face to face time with the patient more than 50% of which was spent counseling and coordination of care, reviewing test results reviewing medications and discussing and reviewing the diagnosis of mild cognitive impairment and worsening headaches he will need to call us for an earlier appointment to initiate other treatment options, Nilda Riggs, [facility], [facility], APRN  [facility] [address] [place], [place] [address] [phone]

## 2016-11-06 NOTE — Patient Instructions (Signed)
The patient claims headaches are doing well with Tylenol Will stop Depakote Continue Aricept for memory will refill Follow up 6 months

## 2016-11-06 NOTE — Progress Notes (Signed)
I have read the note, and I agree with the clinical assessment and plan.  Birttany Dechellis Taheem   

## 2016-12-16 ENCOUNTER — Encounter (INDEPENDENT_AMBULATORY_CARE_PROVIDER_SITE_OTHER): Payer: Self-pay

## 2016-12-16 ENCOUNTER — Ambulatory Visit (INDEPENDENT_AMBULATORY_CARE_PROVIDER_SITE_OTHER)
Admission: RE | Admit: 2016-12-16 | Discharge: 2016-12-16 | Disposition: A | Discharge status: Home / Self Care | Payer: Medicare PPO | Source: Ambulatory Visit | Attending: Physician Assistant | Admitting: Physician Assistant

## 2016-12-16 ENCOUNTER — Ambulatory Visit (INDEPENDENT_AMBULATORY_CARE_PROVIDER_SITE_OTHER): Payer: Medicare PPO | Admitting: Physician Assistant

## 2016-12-16 ENCOUNTER — Encounter: Payer: Self-pay | Admitting: Physician Assistant

## 2016-12-16 VITALS — BP 136/86 | HR 101 | Ht 70.0 in | Wt 163.8 lb

## 2016-12-16 DIAGNOSIS — I10 Essential (primary) hypertension: Secondary | ICD-10-CM

## 2016-12-16 DIAGNOSIS — R0602 Shortness of breath: Secondary | ICD-10-CM

## 2016-12-16 DIAGNOSIS — J3089 Other allergic rhinitis: Secondary | ICD-10-CM | POA: Diagnosis not present

## 2016-12-16 DIAGNOSIS — R0600 Dyspnea, unspecified: Secondary | ICD-10-CM | POA: Insufficient documentation

## 2016-12-16 LAB — BASIC METABOLIC PANEL
BUN/Creatinine Ratio: 13 (ref 9–20)
BUN: 16 mg/dL (ref 6–24)
CO2: 27 mmol/L (ref 18–29)
CREATININE: 1.28 mg/dL — AB (ref 0.76–1.27)
Calcium: 9.6 mg/dL (ref 8.7–10.2)
Chloride: 104 mmol/L (ref 96–106)
GFR, EST AFRICAN AMERICAN: 72 mL/min/{1.73_m2} (ref >59)
GFR, EST NON AFRICAN AMERICAN: 62 mL/min/{1.73_m2} (ref >59)
Glucose: 100 mg/dL — ABNORMAL HIGH (ref 65–99)
Potassium: 4.4 mmol/L (ref 3.5–5.2)
SODIUM: 137 mmol/L (ref 134–144)

## 2016-12-16 MED ORDER — IOPAMIDOL (ISOVUE-370) INJECTION 76%
80.0000 mL | Freq: Once | INTRAVENOUS | Status: AC | PRN
  Administered 2016-12-16: 80 mL via INTRAVENOUS

## 2016-12-16 NOTE — Patient Instructions (Addendum)
Medication Instructions:   Your physician recommends that you continue on your current medications as directed. Please refer to the Current Medication list given to you today.   If you need a refill on your cardiac medications before your next appointment, please call your pharmacy.  Labwork: BMET TODAY STAT   Testing/Procedures:  CT scanning, (CAT scanning), is a noninvasive, special x-ray that produces cross-sectional images of the body using x-rays and a computer. CT scans help physicians diagnose and treat medical conditions. For some CT exams, a contrast material is used to enhance visibility in the area of the body being studied. CT scans provide greater clarity and reveal more details than regular x-ray exams.  Your physician has requested that you have an echocardiogram. Echocardiography is a painless test that uses sound waves to create images of your heart. It provides your doctor with information about the size and shape of your heart and how well your heart's chambers and valves are working. This procedure takes approximately one hour. There are no restrictions for this procedure.   Follow-Up: WITH DR Sable FeilMCALHANY OR LENZE  IN 2 WEEKS   Any Other Special Instructions Will Be Listed Below (If Applicable).

## 2016-12-16 NOTE — Progress Notes (Signed)
Cardiology Office Note    Date:  12/16/2016   ID:  Travis Bryant, DOB February 19, 1961, MRN [id number]  PCP:  Travis Ruiz, MD  Cardiologist: Dr. Clifton James  Chief Complaint  Patient presents with  . Shortness of Breath    History of Present Illness:  Travis Bryant is a 56 y.o. male with history of hypertension, asthma, remote tobacco abuse, and chest pain. Exercise stress test showed no ischemic EKG changes in 2016 and echo normal LV size and function. Chest pain prompting ER visit 11/2015 ruled out for an MI and relieved with GI cocktail. Last saw Dr. Clifton James 05/23/16 and was doing well.  Patient called in to be added onto my schedule today. He traveled to [facility] last week and had acute shortness of breath while there. He says it was worse when he laid down. He said eventually he would fall asleep but he was uncomfortable the whole time he was there and thought it was due to the elevation. He has been back 4 days and continues to have dyspnea and mild central chest pain with deep inspiration. He said doesn't really change with exertion. He denies any swelling of his legs or calf pain and has never had clots before. His allergies are really bothering him and he is seeing his allergist tomorrow. He denies any chest tightness, pressure, arm pain  Past Medical History:  Diagnosis Date  . Asthma   . Benign enlargement of prostate   . Bladder disease   . Environmental allergies   . Hematuria   . 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 History of headache   . HIV exposure   . HTN (hypertension)   . Memory change 01/25/2013  . Restless legs syndrome (RLS) 01/14/2014    Past Surgical History:  Procedure Laterality Date  . ABDOMINAL SURGERY    . BRAIN SURGERY      Current Medications: Outpatient Medications Prior to Visit  Medication Sig Dispense Refill  . acetaminophen (TYLENOL) 325 MG tablet Take 650 mg by mouth every 6 (six) hours as needed (for pain).     Marland Kitchen  albuterol (PROVENTIL HFA;VENTOLIN HFA) 108 (90 Base) MCG/ACT inhaler Inhale 2 puffs into the lungs every 6 (six) hours as needed for wheezing or shortness of breath. 1 Inhaler 1  . amLODipine (NORVASC) 5 MG tablet Take 5 mg by mouth daily.  0  . beclomethasone (QVAR) 80 MCG/ACT inhaler Inhale 2 puffs into the lungs 2 (two) times daily. (Patient taking differently: Inhale 2 puffs into the lungs as needed. ) 1 Inhaler 5  . diclofenac (VOLTAREN) 75 MG EC tablet Take 75 mg by mouth 2 (two) times daily as needed for mild pain.    Marland Kitchen donepezil (ARICEPT) 10 MG tablet Take 1 tablet (10 mg total) by mouth daily. 30 tablet 6  . fluticasone (FLONASE) 50 MCG/ACT nasal spray Place 2 sprays into both nostrils daily. 16 g 5  . fluticasone (FLOVENT HFA) 220 MCG/ACT inhaler Inhale 1 puff into the lungs 2 (two) times daily. 1 Inhaler 5  . ipratropium (ATROVENT) 0.06 % nasal spray Place 2 sprays into both nostrils 4 (four) times daily. 15 mL 1  . levocetirizine (XYZAL) 5 MG tablet Take 1 tablet (5 mg total) by mouth every evening. 30 tablet 5  . omeprazole (PRILOSEC) 40 MG capsule Take 1 capsule (40 mg total) by mouth daily. 30 capsule 11  . tamsulosin (FLOMAX) 0.4 MG CAPS capsule take 2  tabs by mouth daily    . cyclobenzaprine (FLEXERIL) 5 MG tablet Reported on 12/27/2015  0  . meloxicam (MOBIC) 15 MG tablet take 1 tablet by mouth once daily with food  0  . mirabegron ER (MYRBETRIQ) 25 MG TB24 tablet Take 25 mg by mouth daily. Reported on 12/27/2015    . ranitidine (ZANTAC) 150 MG capsule Take 1 capsule (150 mg total) by mouth daily. (Patient not taking: Reported on 12/16/2016) 30 capsule 0  . sildenafil (REVATIO) 20 MG tablet Take 2-5 tablets by mouth prior to intercouse     Facility-Administered Medications Prior to Visit  Medication Dose Route Frequency Provider Last Rate Last Dose  . methylPREDNISolone acetate (DEPO-MEDROL) injection 80 mg  80 mg Intramuscular Once Bobbitt, Heywood Ilesalph Carter, MD      . predniSONE  (DELTASONE) tablet 10 mg  10 mg Oral UD Bobbitt, Heywood Ilesalph Carter, MD         Allergies:   Patient has no known allergies.   Social History   Social History  . Marital status: Single    Spouse name: N/A  . Number of children: 0  . Years of education: college   Occupational History  . Teacher-part [employer]   Social History Main Topics  . Smoking status: Former Smoker    Packs/day: 0.10    Years: 20.00    Types: Cigarettes    Quit date: 10/11/2012  . Smokeless tobacco: Never Used  . Alcohol use 0.0 oz/week     Comment: socially  . Drug use: No  . Sexual activity: Not Asked   Other Topics Concern  . None   Social History Narrative   Patient is left handed.   Patient drinks 3 glasses caffeine daily     Family History:  The patient's   family history includes Arthritis/Rheumatoid in his mother; Cancer in his maternal grandmother; Cancer - Prostate in his paternal grandfather; Heart disease in his father and maternal grandfather; Hypertension in his father; Lupus in his mother; Sarcoidosis in his sister.   ROS:   Please see the history of present illness.    Review of Systems  Constitution: Negative.  HENT: Negative.   Cardiovascular: Positive for dyspnea on exertion.  Respiratory: Positive for shortness of breath and sleep disturbances due to breathing.        Seasonal allergies  Endocrine: Negative.   Hematologic/Lymphatic: Negative.   Musculoskeletal: Negative.   Gastrointestinal: Negative.   Genitourinary: Negative.   Neurological: Negative.    All other systems reviewed and are negative.   PHYSICAL EXAM:   VS:  BP 136/86 (BP Location: Left Arm)   Pulse (!) 101   Ht 5\' 10"  (1.778 m)   Wt 163 lb 12.8 oz (74.3 kg)   SpO2 93%   BMI 23.50 kg/m   Physical Exam  GEN: Well nourished, well developed, in no acute distress  Neck: no JVD, carotid bruits, or masses Cardiac:RRR; no murmurs, rubs, or gallops  Respiratory:  Decreased breath sounds but  clear to auscultation bilaterally, normal work of breathing GI: soft, nontender, nondistended, + BS Ext: without cyanosis, clubbing, or edema, Good distal pulses bilaterally Neuro:  Alert and Oriented x 3 Psych: euthymic mood, full affect  Wt Readings from Last 3 Encounters:  12/16/16 163 lb 12.8 oz (74.3 kg)  11/06/16 161 lb 9.6 oz (73.3 kg)  10/15/16 160 lb (72.6 kg)      Studies/Labs Reviewed:   EKG:  EKG is  ordered today.  The  ekg ordered today demonstrates Sinus tachycardia at 101 bpm, similar to last EKG   Recent Labs: 03/21/2016: ALT 13; BUN 13; Creatinine, Ser 1.19; Platelets 197; Potassium 4.7; Sodium 143   Lipid Panel No results found for: CHOL, TRIG, HDL, CHOLHDL, VLDL, LDLCALC, LDLDIRECT  Additional studies/ records that were reviewed today include:  10/12/14 ETT Interpretation:  normal - no evidence of ischemia by ST  analysis Echo 10/17/14:  Left ventricle: The cavity size was normal. Wall thickness was   normal. Systolic function was vigorous. The estimated ejection   fraction was in the range of 65% to 70%. Wall motion was normal;   there were no regional wall motion abnormalities. Left   ventricular diastolic function parameters were normal. - Mitral valve: Mildly thickened leaflets . There is a partially   flail subvalvular cord attached to the anterior leaflet. This   does not result in any prolpase of the anterior leaflet. There   was trivial regurgitation. - Left atrium: The atrium was normal in size. - Tricuspid valve: There was mild regurgitation. - Pulmonary arteries: PA peak pressure: 34 mm Hg (S). - Inferior vena cava: The vessel was normal in size. The   respirophasic diameter changes were in the normal range (>= 50%),   consistent with normal central venous pressure. Impressions: - LVEF 65-70%, normal wall thickness, normal wall motion, normal   diastolic function, thickened mitral leaflets with a partially   flail anterior cord, no signficant  prolapse or MR is noted, mild   TR, top normal RVSP.     ASSESSMENT:    1. Shortness of breath   2. Benign essential HTN   3. Seasonal and perennial allergic rhinitis      PLAN:  In order of problems listed above:  Shortness of breath with O2 sat at rest of 93% with travel to [facility] last week. We'll check CTA to rule out pulmonary embolism. Discuss with Dr.Nahser who concurs. Renal function was normal last August. We'll check today.  Benign essential hypertension controlled  Seasonal allergies with exacerbation patient seeing allergist tomorrow   Medication Adjustments/Labs and Tests Ordered: Current medicines are reviewed at length with the patient today.  Concerns regarding medicines are outlined above.  Medication changes, Labs and Tests ordered today are listed in the Patient Instructions below. Patient Instructions  Medication Instructions:   Your physician recommends that you continue on your current medications as directed. Please refer to the Current Medication list given to you today.   If you need a refill on your cardiac medications before your next appointment, please call your pharmacy.  Labwork: BMET TODAY STAT   Testing/Procedures:  CT scanning, (CAT scanning), is a noninvasive, special x-ray that produces cross-sectional images of the body using x-rays and a computer. CT scans help physicians diagnose and treat medical conditions. For some CT exams, a contrast material is used to enhance visibility in the area of the body being studied. CT scans provide greater clarity and reveal more details than regular x-ray exams.  Your physician has requested that you have an echocardiogram. Echocardiography is a painless test that uses sound waves to create images of your heart. It provides your doctor with information about the size and shape of your heart and how well your heart's chambers and valves are working. This procedure takes approximately one hour. There  are no restrictions for this procedure.   Follow-Up: WITH DR Sable Feil  IN 2 WEEKS   Any Other Special Instructions Will Be  Listed Below (If Applicable).                                                                                                                                                      Elson Clan, PA-C  12/16/2016 11:21 AM    [facility] [address] [place], [place], [place]  [address] Phone: [phone]; Fax: [phone]

## 2016-12-17 ENCOUNTER — Ambulatory Visit (INDEPENDENT_AMBULATORY_CARE_PROVIDER_SITE_OTHER): Payer: Medicare PPO | Admitting: Allergy and Immunology

## 2016-12-17 ENCOUNTER — Encounter: Payer: Self-pay | Admitting: Allergy and Immunology

## 2016-12-17 VITALS — BP 126/72 | HR 96 | Temp 98.0°F | Resp 18

## 2016-12-17 DIAGNOSIS — J3089 Other allergic rhinitis: Secondary | ICD-10-CM

## 2016-12-17 DIAGNOSIS — J0100 Acute maxillary sinusitis, unspecified: Secondary | ICD-10-CM | POA: Diagnosis not present

## 2016-12-17 DIAGNOSIS — J4530 Mild persistent asthma, uncomplicated: Secondary | ICD-10-CM

## 2016-12-17 MED ORDER — PREDNISONE 1 MG PO TABS: 10.0000 mg | ORAL_TABLET | Freq: Every day | ORAL | Status: DC

## 2016-12-17 MED ORDER — AZELASTINE HCL 0.15 % NA SOLN: NASAL | 5 refills | Status: DC

## 2016-12-17 NOTE — Patient Instructions (Addendum)
Acute sinusitis  Prednisone has been provided, 20 mg x 4 days, 10 mg x1 day, then stop.  A prescription has been provided for azelastine nasal spray, 1-2 sprays per nostril 2 times daily. Proper nasal spray technique has been discussed and demonstrated.   If needed, and fluticasone nasal spray, one spray per nostril twice daily.  I have also recommended nasal saline spray (i.e., Simply Saline) or nasal saline lavage (i.e., NeilMed) as needed and prior to medicated nasal sprays.  For thick post nasal drainage, add guaifenesin 1200 mg (Mucinex Maximum Strength)  twice daily as needed with adequate hydration as discussed.  Seasonal and perennial allergic rhinitis Aeroallergen avoidance measures have been discussed and provided in written form.  Treatment plan as outlined above for acute sinusitis.  If allergen avoidance measures and medications fail to adequately relieve symptoms, aeroallergen immunotherapy will be considered.  Mild persistent asthma  Flovent 220 g, one inhalation via spacer device twice a day.   During respiratory tract infections or asthma flares, increase Flovent 220g to 3 inhalations 2 times per day until symptoms have returned to baseline.  Continue albuterol HFA, 1-2 inhalations every 4-6 hours as needed.  Subjective and objective measures of pulmonary function will be followed and the treatment plan will be adjusted accordingly.   Return in about 5 months (around 05/19/2017), or if symptoms worsen or fail to improve.  Reducing Pollen Exposure  The American Academy of Allergy, Asthma and Immunology suggests the following steps to reduce your exposure to pollen during allergy seasons.    1. Do not hang sheets or clothing out to dry; pollen may collect on these items. 2. Do not mow lawns or spend time around freshly cut grass; mowing stirs up pollen. 3. Keep windows closed at night.  Keep car windows closed while driving. 4. Minimize morning activities  outdoors, a time when pollen counts are usually at their highest. 5. Stay indoors as much as possible when pollen counts or humidity is high and on windy days when pollen tends to remain in the air longer. 6. Use air conditioning when possible.  Many air conditioners have filters that trap the pollen spores. 7. Use a HEPA room air filter to remove pollen form the indoor air you breathe.

## 2016-12-17 NOTE — Progress Notes (Signed)
Follow-up Note  RE: Travis Bryant MRN: [id number] DOB: 01-24-1961 Date of Office Visit: 12/17/2016  Primary care provider: Jonny Ruiz, MD Referring provider: Jonny Ruiz, MD  History of present illness: Travis Bryant is a 56 y.o. male with persistent asthma, allergic rhinitis, and gastroesophageal reflux presenting today for follow up.  He was last seen in this clinic on 10/15/2016.  He reports that he recently went to [facility] for a weeklong trip with his sister.  He had some increased dyspnea, particularly at nighttime, with the elevation but his lower respiratory symptoms improved upon returning home.  However, a few days after returning home he began to experience severe nasal congestion as well as sinus pressure, postnasal drainage, rhinorrhea, and sneezing.  He has had some increased coughing which he believes is secondary to postnasal drainage and originates as a tickle in the base of his throat.   Assessment and plan: Acute sinusitis  Prednisone has been provided, 20 mg x 4 days, 10 mg x1 day, then stop.  A prescription has been provided for azelastine nasal spray, 1-2 sprays per nostril 2 times daily. Proper nasal spray technique has been discussed and demonstrated.   If needed, and fluticasone nasal spray, one spray per nostril twice daily.  I have also recommended nasal saline spray (i.e., Simply Saline) or nasal saline lavage (i.e., NeilMed) as needed and prior to medicated nasal sprays.  For thick post nasal drainage, add guaifenesin 1200 mg (Mucinex Maximum Strength)  twice daily as needed with adequate hydration as discussed.  Seasonal and perennial allergic rhinitis Aeroallergen avoidance measures have been discussed and provided in written form.  Treatment plan as outlined above for acute sinusitis.  If allergen avoidance measures and medications fail to adequately relieve symptoms, aeroallergen immunotherapy will be considered.  Mild persistent  asthma  Flovent 220 g, one inhalation via spacer device twice a day.   During respiratory tract infections or asthma flares, increase Flovent 220g to 3 inhalations 2 times per day until symptoms have returned to baseline.  Continue albuterol HFA, 1-2 inhalations every 4-6 hours as needed.  Subjective and objective measures of pulmonary function will be followed and the treatment plan will be adjusted accordingly.   Meds ordered this encounter  Medications  . Azelastine HCl 0.15 % SOLN    Sig: 1-2 sprays per nostril 2 times daily    Dispense:  30 mL    Refill:  5  . predniSONE (DELTASONE) tablet 10 mg    Diagnostics: Spirometry:  Normal with an FEV1 of 105% predicted.  Please see scanned spirometry results for details.    Physical examination: Blood pressure 126/72, pulse 96, temperature 98 F (36.7 C), temperature source Tympanic, resp. rate 18, SpO2 96 %.  General: Alert, interactive, in no acute distress. HEENT: TMs pearly gray, turbinates moderately edematous with thick discharge, post-pharynx erythematous. Neck: Supple without lymphadenopathy. Lungs: Clear to auscultation without wheezing, rhonchi or rales. CV: Normal S1, S2 without murmurs. Skin: Warm and dry, without lesions or rashes.  The following portions of the patient's history were reviewed and updated as appropriate: allergies, current medications, past family history, past medical history, past social history, past surgical history and problem list.  Allergies as of 12/17/2016   No Known Allergies     Medication List       Accurate as of 12/17/16  1:18 PM. Always use your most recent med list.          acetaminophen 325 MG tablet Commonly known  as:  TYLENOL Take 650 mg by mouth every 6 (six) hours as needed (for pain).   albuterol 108 (90 Base) MCG/ACT inhaler Commonly known as:  PROVENTIL HFA;VENTOLIN HFA Inhale 2 puffs into the lungs every 6 (six) hours as needed for wheezing or shortness of  breath.   amLODipine 5 MG tablet Commonly known as:  NORVASC Take 5 mg by mouth daily.   Azelastine HCl 0.15 % Soln 1-2 sprays per nostril 2 times daily   beclomethasone 80 MCG/ACT inhaler Commonly known as:  QVAR Inhale 2 puffs into the lungs 2 (two) times daily.   diclofenac 75 MG EC tablet Commonly known as:  VOLTAREN Take 75 mg by mouth 2 (two) times daily as needed for mild pain.   donepezil 10 MG tablet Commonly known as:  ARICEPT Take 1 tablet (10 mg total) by mouth daily.   fluticasone 220 MCG/ACT inhaler Commonly known as:  FLOVENT HFA Inhale 1 puff into the lungs 2 (two) times daily.   fluticasone 50 MCG/ACT nasal spray Commonly known as:  FLONASE Place 2 sprays into both nostrils daily.   ipratropium 0.06 % nasal spray Commonly known as:  ATROVENT Place 2 sprays into both nostrils 4 (four) times daily.   levocetirizine 5 MG tablet Commonly known as:  XYZAL Take 1 tablet (5 mg total) by mouth every evening.   omeprazole 40 MG capsule Commonly known as:  PRILOSEC Take 1 capsule (40 mg total) by mouth daily.   tamsulosin 0.4 MG Caps capsule Commonly known as:  FLOMAX take 2 tabs by mouth daily       No Known Allergies  Review of systems: Review of systems negative except as noted in HPI / PMHx or noted below: Constitutional: Negative.  HENT: Negative.   Eyes: Negative.  Respiratory: Negative.   Cardiovascular: Negative.  Gastrointestinal: Negative.  Genitourinary: Negative.  Musculoskeletal: Negative.  Neurological: Negative.  Endo/Heme/Allergies: Negative.  Cutaneous: Negative.  Past Medical History:  Diagnosis Date  . Asthma   . Benign enlargement of prostate   . Bladder disease   . Environmental allergies   . Hematuria   . 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 History of headache   . HIV exposure   . HTN (hypertension)   . Memory change 01/25/2013  . Restless legs syndrome (RLS) 01/14/2014    Family  History  Problem Relation Age of Onset  . Lupus Mother   . Arthritis/Rheumatoid Mother   . Hypertension Father   . Heart disease Father   . Sarcoidosis Sister   . Cancer Maternal Grandmother   . Heart disease Maternal Grandfather   . Cancer - Prostate Paternal Grandfather   . Allergic rhinitis Neg Hx   . Angioedema Neg Hx   . Asthma Neg Hx   . Eczema Neg Hx   . Immunodeficiency Neg Hx   . Urticaria Neg Hx     Social History   Social History  . Marital status: Single    Spouse name: N/A  . Number of children: 0  . Years of education: college   Occupational History  . 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 Smoking status: Former Smoker    Packs/day: 0.10    Years: 20.00    Types: Cigarettes    Quit date: 10/11/2012  . Smokeless tobacco: Never Used  . Alcohol use 0.0 oz/week     Comment: socially  . Drug use: No  .  Sexual activity: Not on file   Other Topics Concern  . Not on file   Social History Narrative   Patient is left handed.   Patient drinks 3 glasses caffeine daily     I appreciate the opportunity to take part in GordonKeith's care. Please do not hesitate to contact me with questions.  Sincerely,   R. Jorene Guestarter Marvelle Span, MD

## 2016-12-17 NOTE — Assessment & Plan Note (Signed)
   Prednisone has been provided, 20 mg x 4 days, 10 mg x1 day, then stop.  A prescription has been provided for azelastine nasal spray, 1-2 sprays per nostril 2 times daily. Proper nasal spray technique has been discussed and demonstrated.   If needed, and fluticasone nasal spray, one spray per nostril twice daily.  I have also recommended nasal saline spray (i.e., Simply Saline) or nasal saline lavage (i.e., NeilMed) as needed and prior to medicated nasal sprays.  For thick post nasal drainage, add guaifenesin 1200 mg (Mucinex Maximum Strength)  twice daily as needed with adequate hydration as discussed.

## 2016-12-17 NOTE — Assessment & Plan Note (Signed)
Aeroallergen avoidance measures have been discussed and provided in written form.  Treatment plan as outlined above for acute sinusitis.  If allergen avoidance measures and medications fail to adequately relieve symptoms, aeroallergen immunotherapy will be considered.

## 2016-12-17 NOTE — Assessment & Plan Note (Signed)
   Flovent 220 g, one inhalation via spacer device twice a day.   During respiratory tract infections or asthma flares, increase Flovent 220g to 3 inhalations 2 times per day until symptoms have returned to baseline.  Continue albuterol HFA, 1-2 inhalations every 4-6 hours as needed.  Subjective and objective measures of pulmonary function will be followed and the treatment plan will be adjusted accordingly.

## 2016-12-30 ENCOUNTER — Other Ambulatory Visit: Payer: Self-pay

## 2016-12-30 ENCOUNTER — Ambulatory Visit (HOSPITAL_COMMUNITY): Payer: Medicare PPO | Attending: Cardiology

## 2016-12-30 DIAGNOSIS — I083 Combined rheumatic disorders of mitral, aortic and tricuspid valves: Secondary | ICD-10-CM | POA: Insufficient documentation

## 2016-12-30 DIAGNOSIS — R0602 Shortness of breath: Secondary | ICD-10-CM | POA: Diagnosis present

## 2017-01-02 ENCOUNTER — Ambulatory Visit (INDEPENDENT_AMBULATORY_CARE_PROVIDER_SITE_OTHER): Payer: Medicare PPO | Admitting: Physician Assistant

## 2017-01-02 ENCOUNTER — Encounter (INDEPENDENT_AMBULATORY_CARE_PROVIDER_SITE_OTHER): Payer: Self-pay

## 2017-01-02 ENCOUNTER — Encounter: Payer: Self-pay | Admitting: Physician Assistant

## 2017-01-02 VITALS — BP 130/68 | HR 100 | Ht 70.0 in | Wt 150.0 lb

## 2017-01-02 DIAGNOSIS — J3089 Other allergic rhinitis: Secondary | ICD-10-CM | POA: Diagnosis not present

## 2017-01-02 DIAGNOSIS — R0602 Shortness of breath: Secondary | ICD-10-CM

## 2017-01-02 DIAGNOSIS — I10 Essential (primary) hypertension: Secondary | ICD-10-CM | POA: Diagnosis not present

## 2017-01-02 NOTE — Patient Instructions (Signed)
Medication Instructions:  Your physician recommends that you continue on your current medications as directed. Please refer to the Current Medication list given to you today.   Labwork: None Ordered   Testing/Procedures: None Ordered   Follow-Up: Your physician recommends that you schedule a follow-up appointment in: schedule appointment with Dr. Clifton JamesMcAlhany in October.   Any Other Special Instructions Will Be Listed Below (If Applicable).     If you need a refill on your cardiac medications before your next appointment, please call your pharmacy.  Thank you for choosing [facility]  Corrine CMA AAMA

## 2017-01-02 NOTE — Progress Notes (Signed)
Cardiology Office Note    Date:  01/02/2017   ID:  Travis MetroKeith L Downen, DOB [birth year], MRN [id number]  PCP:  Jonny RuizLlibre, Giovanni, MD  Cardiologist: Dr. Clifton JamesMcAlhany  Chief Complaint  Patient presents with  . Follow-up    History of Present Illness:  Travis Bryant is a 56 y.o. male with history of hypertension, asthma, remote tobacco abuse, and chest pain. Exercise stress test showed no ischemic EKG changes in 2016 and echo normal LV size and function. Chest pain prompting ER visit 11/2015 ruled out for an MI and relieved with GI cocktail. Last saw Dr. Clifton JamesMcAlhany 05/23/16 and was doing well.  I saw him as an add-on on 12/16/16 with acute shortness of breath worse when he laid down. [place] had [place]. His O2 sat at rest were 93%. He was also being bothered by seasonal allergies. The CTA which was negative for PE. 2-D echo 12/30/16 showed normal LVEF 55-60% with grade 1 DD.  Comes in today saying he is still having trouble with his allergies and asthma but overall is feeling better. He was treated for acute sinusitis with prednisone and given Flovent as well as albuterol for his asthma. He also says he had floaters in his right eye and saw his ophthalmologist which thought it might be a low pressure related. His blood pressure has been well controlled on amlodipine. Overall breathing has improved.     Past Medical History:  Diagnosis Date  . Asthma   . Benign enlargement of prostate   . Bladder disease   . Environmental allergies   . Hematuria   . 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 History of headache   . HIV exposure   . HTN (hypertension)   . Memory change 01/25/2013  . Restless legs syndrome (RLS) 01/14/2014    Past Surgical History:  Procedure Laterality Date  . ABDOMINAL SURGERY    . BRAIN SURGERY      Current Medications: Outpatient Medications Prior to Visit  Medication Sig Dispense Refill  . acetaminophen (TYLENOL) 325 MG tablet Take 650  mg by mouth every 6 (six) hours as needed (for pain).     Marland Kitchen. albuterol (PROVENTIL HFA;VENTOLIN HFA) 108 (90 Base) MCG/ACT inhaler Inhale 2 puffs into the lungs every 6 (six) hours as needed for wheezing or shortness of breath. 1 Inhaler 1  . amLODipine (NORVASC) 5 MG tablet Take 5 mg by mouth daily.  0  . Azelastine HCl 0.15 % SOLN 1-2 sprays per nostril 2 times daily 30 mL 5  . beclomethasone (QVAR) 80 MCG/ACT inhaler Inhale 2 puffs into the lungs 2 (two) times daily. 1 Inhaler 5  . diclofenac (VOLTAREN) 75 MG EC tablet Take 75 mg by mouth 2 (two) times daily as needed for mild pain.    Marland Kitchen. donepezil (ARICEPT) 10 MG tablet Take 1 tablet (10 mg total) by mouth daily. 30 tablet 6  . fluticasone (FLOVENT HFA) 220 MCG/ACT inhaler Inhale 1 puff into the lungs 2 (two) times daily. 1 Inhaler 5  . ipratropium (ATROVENT) 0.06 % nasal spray Place 2 sprays into both nostrils 4 (four) times daily. 15 mL 1  . levocetirizine (XYZAL) 5 MG tablet Take 1 tablet (5 mg total) by mouth every evening. 30 tablet 5  . omeprazole (PRILOSEC) 40 MG capsule Take 1 capsule (40 mg total) by mouth daily. 30 capsule 11  . tamsulosin (FLOMAX) 0.4 MG CAPS capsule take 2 tabs by  mouth daily    . fluticasone (FLONASE) 50 MCG/ACT nasal spray Place 2 sprays into both nostrils daily. 16 g 5   Facility-Administered Medications Prior to Visit  Medication Dose Route Frequency Provider Last Rate Last Dose  . methylPREDNISolone acetate (DEPO-MEDROL) injection 80 mg  80 mg Intramuscular Once Bobbitt, Heywood Iles, MD      . predniSONE (DELTASONE) tablet 10 mg  10 mg Oral UD Bobbitt, Heywood Iles, MD      . predniSONE (DELTASONE) tablet 10 mg  10 mg Oral Q breakfast Bobbitt, Heywood Iles, MD         Allergies:   Patient has no known allergies.   Social History   Social History  . Marital status: Single    Spouse name: N/A  . Number of children: 0  . Years of education: college   Occupational History  . 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 Smoking status: Former Smoker    Packs/day: 0.10    Years: 20.00    Types: Cigarettes    Quit date: 10/11/2012  . Smokeless tobacco: Never Used  . Alcohol use 0.0 oz/week     Comment: socially  . Drug use: No  . Sexual activity: Not Asked   Other Topics Concern  . None   Social History Narrative   Patient is left handed.   Patient drinks 3 glasses caffeine daily     Family History:  The patient's family history includes Arthritis/Rheumatoid in his mother; Cancer in his maternal grandmother; Cancer - Prostate in his paternal grandfather; Heart disease in his father and maternal grandfather; Hypertension in his father; Lupus in his mother; Sarcoidosis in his sister.   ROS:   Please see the history of present illness.    Review of Systems  Constitution: Negative.  HENT: Negative.   Cardiovascular: Negative.   Respiratory: Positive for cough and sleep disturbances due to breathing.   Endocrine: Negative.   Hematologic/Lymphatic: Negative.   Musculoskeletal: Negative.   Gastrointestinal: Negative.   Genitourinary: Negative.   Neurological: Negative.    All other systems reviewed and are negative.   PHYSICAL EXAM:   VS:  BP 130/68   Pulse 100   Ht 5\' 10"  (1.778 m)   Wt 150 lb (68 kg)   BMI 21.52 kg/m   Physical Exam  GEN: Well nourished, well developed, in no acute distress  Neck: no JVD, carotid bruits, or masses Cardiac:RRR; no murmurs, rubs, or gallops  Respiratory: Decreased breath sounds but clear, normal work of breathing GI: soft, nontender, nondistended, + BS Ext: without cyanosis, clubbing, or edema, Good distal pulses bilaterally Neuro:  Alert and Oriented x 3 Psych: euthymic mood, full affect  Wt Readings from Last 3 Encounters:  01/02/17 150 lb (68 kg)  12/16/16 163 lb 12.8 oz (74.3 kg)  11/06/16 161 lb 9.6 oz (73.3 kg)      Studies/Labs Reviewed:   EKG:  EKG is not ordered today.     Recent  Labs: 03/21/2016: ALT 13; Platelets 197 12/16/2016: BUN 16; Creatinine, Ser 1.28; Potassium 4.4; Sodium 137   Lipid Panel No results found for: CHOL, TRIG, HDL, CHOLHDL, VLDL, LDLCALC, LDLDIRECT  Additional studies/ records that were reviewed today include:   2-D echo 5/21/18Study Conclusions   - Left ventricle: The cavity size was normal. Systolic function was   normal. The estimated ejection fraction was in the range of 55%   to 60%. Wall motion was normal; there were  no regional wall   motion abnormalities. Doppler parameters are consistent with   abnormal left ventricular relaxation (grade 1 diastolic   dysfunction). There was no evidence of elevated ventricular   filling pressure by Doppler parameters. - Aortic valve: Trileaflet; normal thickness leaflets. There was   trivial regurgitation. - Aortic root: The aortic root was normal in size. - Mitral valve: Structurally normal valve. There was mild   regurgitation. - Left atrium: The atrium was normal in size. - Right ventricle: The cavity size was normal. Wall thickness was   normal. Systolic function was normal. - Right atrium: The atrium was normal in size. - Tricuspid valve: There was mild regurgitation. - Pulmonary arteries: Systolic pressure was within the normal   range. - Inferior vena cava: The vessel was normal in size. - Pericardium, extracardiac: There was no pericardial effusion.   CTA 5/7/18IMPRESSION: 1. No acute findings.  No evidence for acute pulmonary embolus.     Electronically Signed   By: Signa Kell M.D.   On: 12/16/2016 13:47    ASSESSMENT:    1. Shortness of breath   2. Benign essential HTN   3. Seasonal and perennial allergic rhinitis      PLAN:  In order of problems listed above:  Shortness of breath has improved with treatment of his asthma. CTA negative for PE and 2-D echo with normal LVEF 55-65% with grade 1 DD. No evidence of heart failure on exam.  Benign essential  hypertension well controlled on amlodipine. Patient is having some eye problems. I told him to take his blood pressure at home and keep track of it to make sure it's stable at home.  Seasonal allergies being treated by his allergist. Heart rate is up a little bit today I suspect secondary to all his allergy medications  Medication Adjustments/Labs and Tests Ordered: Current medicines are reviewed at length with the patient today.  Concerns regarding medicines are outlined above.  Medication changes, Labs and Tests ordered today are listed in the Patient Instructions below. Patient Instructions  Medication Instructions:  Your physician recommends that you continue on your current medications as directed. Please refer to the Current Medication list given to you today.   Labwork: None Ordered   Testing/Procedures: None Ordered   Follow-Up: Your physician recommends that you schedule a follow-up appointment in: schedule appointment with Dr. Clifton James in October.   Any Other Special Instructions Will Be Listed Below (If Applicable).     If you need a refill on your cardiac medications before your next appointment, please call your pharmacy.  Thank you for choosing [facility]  Corrine CMA AAMA        Signed, Jacolyn Reedy, PA-C  01/02/2017 2:03 PM    [facility] [address] [place], [place], [place]  [address] Phone: [phone]; Fax: [phone]

## 2017-01-29 ENCOUNTER — Encounter (HOSPITAL_COMMUNITY): Payer: Self-pay | Admitting: Emergency Medicine

## 2017-01-29 DIAGNOSIS — Y999 Unspecified external cause status: Secondary | ICD-10-CM | POA: Diagnosis not present

## 2017-01-29 DIAGNOSIS — Y929 Unspecified place or not applicable: Secondary | ICD-10-CM | POA: Insufficient documentation

## 2017-01-29 DIAGNOSIS — Z79899 Other long term (current) drug therapy: Secondary | ICD-10-CM | POA: Diagnosis not present

## 2017-01-29 DIAGNOSIS — Y9301 Activity, walking, marching and hiking: Secondary | ICD-10-CM | POA: Insufficient documentation

## 2017-01-29 DIAGNOSIS — Z23 Encounter for immunization: Secondary | ICD-10-CM | POA: Diagnosis not present

## 2017-01-29 DIAGNOSIS — Z87891 Personal history of nicotine dependence: Secondary | ICD-10-CM | POA: Diagnosis not present

## 2017-01-29 DIAGNOSIS — S60811A Abrasion of right wrist, initial encounter: Secondary | ICD-10-CM | POA: Insufficient documentation

## 2017-01-29 DIAGNOSIS — J45909 Unspecified asthma, uncomplicated: Secondary | ICD-10-CM | POA: Insufficient documentation

## 2017-01-29 DIAGNOSIS — S50311A Abrasion of right elbow, initial encounter: Secondary | ICD-10-CM | POA: Diagnosis not present

## 2017-01-29 DIAGNOSIS — S6992XA Unspecified injury of left wrist, hand and finger(s), initial encounter: Secondary | ICD-10-CM | POA: Diagnosis present

## 2017-01-29 DIAGNOSIS — I10 Essential (primary) hypertension: Secondary | ICD-10-CM | POA: Diagnosis not present

## 2017-01-29 DIAGNOSIS — W540XXA Bitten by dog, initial encounter: Secondary | ICD-10-CM | POA: Diagnosis not present

## 2017-01-29 NOTE — ED Triage Notes (Signed)
Pt states he was bitten by his dog while trying to separate two dogs fighting. Puncture wound to left ring and middle fingers. Abrasions noted to right elbow and wrist. Pt states dog's vaccinations up to date.

## 2017-01-30 ENCOUNTER — Emergency Department (HOSPITAL_COMMUNITY)
Admission: EM | Admit: 2017-01-30 | Discharge: 2017-01-30 | Disposition: A | Discharge status: Home / Self Care | Payer: Medicare PPO | Attending: Emergency Medicine | Admitting: Emergency Medicine

## 2017-01-30 ENCOUNTER — Emergency Department (HOSPITAL_COMMUNITY): Payer: Medicare PPO

## 2017-01-30 DIAGNOSIS — W540XXA Bitten by dog, initial encounter: Secondary | ICD-10-CM

## 2017-01-30 MED ORDER — TETANUS-DIPHTH-ACELL PERTUSSIS 5-2.5-18.5 LF-MCG/0.5 IM SUSP
0.5000 mL | Freq: Once | INTRAMUSCULAR | Status: AC
  Administered 2017-01-30: 0.5 mL via INTRAMUSCULAR
  Filled 2017-01-30: qty 0.5

## 2017-01-30 MED ORDER — AMOXICILLIN-POT CLAVULANATE 875-125 MG PO TABS: 1.0000 | ORAL_TABLET | Freq: Two times a day (BID) | ORAL | 0 refills | Status: AC

## 2017-01-30 NOTE — ED Notes (Signed)
Patient transported to X-ray 

## 2017-01-30 NOTE — Discharge Instructions (Signed)
As discussed, keep the area clean and monitor for any signs of infection. Return to be seen immediately if you experience worsening pain, swelling, purulent discharge, redness or warmth around the site.  Follow-up with your primary care provider as needed. Return to the emergency department if symptoms worsen the meantime.

## 2017-01-30 NOTE — ED Provider Notes (Signed)
[facility]-EMERGENCY DEPT Provider Note   CSN: [id number] Arrival date & time: 01/29/17  2150     History   Chief Complaint Chief Complaint  Patient presents with  . Animal Bite    HPI Travis Bryant is a 56 y.o. male presenting with dog bite injury to left hand with puncture wounds to distal ring finger and middle finger from an incident that occurred tonight at 7 pm. He explains that he was walking his dog when one of his neighbor's dog came charging at his dog. He attempted to separate the 2 dogs when he got bit by his own dog. He reports that his dog's vaccinations are up-to-date. He is unsure about his tetanus status. He denies head trauma or loss of consciousness. He also has some superficial abrasions to his right forearm.  HPI  Past Medical History:  Diagnosis Date  . Asthma   . Benign enlargement of prostate   . Bladder disease   . Environmental allergies   . Hematuria   . 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 History of headache   . HIV exposure   . HTN (hypertension)   . Memory change 01/25/2013  . Restless legs syndrome (RLS) 01/14/2014    Patient Active Problem List   Diagnosis Date Noted  . Hypertensive disorder 01/02/2017  . Benign essential HTN 12/16/2016  . Dyspnea 12/16/2016  . Memory impairment 11/06/2016  . Disorder of bladder 08/22/2016  . Injury due to exposure to external cause 08/22/2016  . History of hematuria 08/22/2016  . 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 Increased frequency of urination 08/22/2016  . History of head injury 07/30/2016  . Acute sinusitis 12/27/2015  . Mild persistent asthma 12/27/2015  . Seasonal and perennial allergic rhinitis 12/27/2015  . Gastroesophageal reflux disease without esophagitis 12/27/2015  . Persistent cough 12/27/2015  . Tendinitis of shoulder 08/29/2015  . Benign prostatic hyperplasia without urinary obstruction 06/21/2015  . History of tuberculosis 06/21/2015    . Asthma 06/19/2015  . Environmental allergies 06/19/2015  . 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 Restless legs 01/14/2014  . Memory change 01/25/2013  . Head injury 12/12/2011  . Migraine 12/12/2011    Past Surgical History:  Procedure Laterality Date  . ABDOMINAL SURGERY    . BRAIN SURGERY         Home Medications    Prior to Admission medications   Medication Sig Start Date End Date Taking? Authorizing Provider  acetaminophen (TYLENOL) 325 MG tablet Take 650 mg by mouth every 6 (six) hours as needed (for pain).     [provider]  albuterol (PROVENTIL HFA;VENTOLIN HFA) 108 (90 Base) MCG/ACT inhaler Inhale 2 puffs into the lungs every 6 (six) hours as needed for wheezing or shortness of breath. 10/15/16   Bobbitt, Heywood Iles, MD  amLODipine (NORVASC) 5 MG tablet Take 5 mg by mouth daily. 07/28/15   [provider]  amoxicillin-clavulanate (AUGMENTIN) 875-125 MG tablet Take 1 tablet by mouth 2 (two) times daily. 01/30/17 02/04/17  Mathews Robinsons B, PA-C  Azelastine HCl 0.15 % SOLN 1-2 sprays per nostril 2 times daily 12/17/16   Bobbitt, Heywood Iles, MD  beclomethasone (QVAR) 80 MCG/ACT inhaler Inhale 2 puffs into the lungs 2 (two) times daily. 12/27/15   Bobbitt, Heywood Iles, MD  diclofenac (VOLTAREN) 75 MG EC tablet Take 75 mg by mouth 2 (two) times daily as needed for mild pain.    [provider]  donepezil (ARICEPT) 10 MG tablet Take 1 tablet (10 mg total) by mouth daily. 11/06/16   Nilda RiggsMartin, Nancy Carolyn, NP  fluticasone [facility](FLONASE) 50 MCG/ACT nasal spray Place 2 sprays into both nostrils daily. 12/27/15 12/26/16  Bobbitt, Heywood Ilesalph Carter, MD  fluticasone (FLOVENT HFA) 220 MCG/ACT inhaler Inhale 1 puff into the lungs 2 (two) times daily. 10/15/16   Bobbitt, Heywood Ilesalph Carter, MD  ipratropium (ATROVENT) 0.06 % nasal spray Place 2 sprays into both nostrils 4 (four) times daily. 07/18/14   Rodolph Bongorey, Evan S, MD  levocetirizine (XYZAL) 5 MG tablet Take 1 tablet (5 mg total) by  mouth every evening. 10/15/16   Bobbitt, Heywood Ilesalph Carter, MD  omeprazole (PRILOSEC) 40 MG capsule Take 1 capsule (40 mg total) by mouth daily. 12/14/15   Janetta Horahompson, Kathryn R, PA-C  tamsulosin [facility](FLOMAX) 0.4 MG CAPS capsule take 2 tabs by mouth daily 08/21/16   [provider]    Family History Family History  Problem Relation Age of Onset  . Lupus Mother   . Arthritis/Rheumatoid Mother   . Hypertension Father   . Heart disease Father   . Sarcoidosis Sister   . Cancer Maternal Grandmother   . Heart disease Maternal Grandfather   . Cancer - Prostate Paternal Grandfather   . Allergic rhinitis Neg Hx   . Angioedema Neg Hx   . Asthma Neg Hx   . Eczema Neg Hx   . Immunodeficiency Neg Hx   . Urticaria Neg Hx     Social History Social History  Substance Use Topics  . Smoking status: Former Smoker    Packs/day: 0.10    Years: 20.00    Types: Cigarettes    Quit date: 10/11/2012  . Smokeless tobacco: Never Used  . Alcohol use 0.0 oz/week     Comment: socially     Allergies   Patient has no known allergies.   Review of Systems Review of Systems  Respiratory: Negative for cough, shortness of breath and stridor.   Cardiovascular: Negative for chest pain, palpitations and leg swelling.  Gastrointestinal: Negative for abdominal pain, nausea and vomiting.  Musculoskeletal: Negative for arthralgias, back pain, joint swelling, neck pain and neck stiffness.  Skin: Positive for wound. Negative for color change and pallor.  Neurological: Negative for dizziness, seizures, syncope, weakness, light-headedness, numbness and headaches.     Physical Exam Updated Vital Signs BP (!) 145/93 (BP Location: Right Arm)   Pulse 77   Temp 98.1 F (36.7 C) (Oral)   Resp 16   Ht 5\' 9"  (1.753 m)   Wt 72.6 kg (160 lb)   SpO2 99%   BMI 23.63 kg/m   Physical Exam  Constitutional: He appears well-developed and well-nourished. No distress.  Patient is afebrile, nontoxic-appearing, sitting  comfortably in bed in no acute distress.  HENT:  Head: Normocephalic and atraumatic.  Eyes: EOM are normal.  Neck: Normal range of motion.  Cardiovascular: Normal rate, regular rhythm, normal heart sounds and intact distal pulses.   No murmur heard. Pulmonary/Chest: Effort normal and breath sounds normal. No respiratory distress. He has no wheezes. He has no rales.  Abdominal: He exhibits no distension.  Musculoskeletal: Normal range of motion. He exhibits tenderness. He exhibits no edema or deformity.  Left distal ring finger tender to palpation. Patient has full range of motion and 5 out of 5 strength resisted flexion and extension at the PIP and DIP.  Neurological: He is alert. No sensory deficit.  Skin: Skin is warm and dry. Capillary refill takes less  than 2 seconds. No rash noted. He is not diaphoretic. No erythema. No pallor.  Two 1 cm circular abrasions to the dorsal wrist of the right hand and one small abrasion distal to the right elbow.  Psychiatric: He has a normal mood and affect.  Nursing note and vitals reviewed.    ED Treatments / Results  Labs (all labs ordered are listed, but only abnormal results are displayed) Labs Reviewed - No data to display  EKG  EKG Interpretation None       Radiology Dg Hand Complete Left  Result Date: 01/30/2017 CLINICAL DATA:  Left hand injury, dog bite tonight. Tender to palpation distal ring finger. EXAM: LEFT HAND - COMPLETE 3+ VIEW COMPARISON:  None. FINDINGS: There is no evidence of fracture or dislocation. 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 Incidental note of lunotriquetral coalition. No bony destructive change. Small focus of soft tissue air about the nail bed of the ring finger. No radiopaque foreign body. IMPRESSION: Small focus of air about the nail bed of the ring finger. No acute osseous abnormality. Electronically Signed   By: Rubye Oaks M.D.   On: 01/30/2017 01:35    Procedures Procedures  (including critical care time)  Medications Ordered in ED Medications  Tdap (BOOSTRIX) injection 0.5 mL (0.5 mLs Intramuscular Given 01/30/17 0047)     Initial Impression / Assessment and Plan / ED Course  I have reviewed the triage vital signs and the nursing notes.  Pertinent labs & imaging results that were available during my care of the patient were reviewed by me and considered in my medical decision making (see chart for details).    Patient presents with puncture dog bite wound to the distal left middle and ring finger. Dog vaccinated and known to patient.  Tdap Updated in the ED.  X-ray without evidence of acute fracture or dislocation. Wounds were well irrigated and wound dressing applied.  Will discharge patient home with PCP follow up and five-day Augmentin course prophylaxis due to puncture wounds to the hand.   Discussed strict return precautions and advised to return to the emergency department if experiencing any new or worsening symptoms. Instructions were understood and patient agreed with discharge plan. Final Clinical Impressions(s) / ED Diagnoses   Final diagnoses:  Dog bite, initial encounter    New Prescriptions New Prescriptions   AMOXICILLIN-CLAVULANATE (AUGMENTIN) 875-125 MG TABLET    Take 1 tablet by mouth 2 (two) times daily.     Georgiana Shore, PA-C 01/30/17 0139    Ward, Layla Maw, DO 01/30/17 [phone]

## 2017-01-30 NOTE — ED Notes (Signed)
ED Provider at bedside. 

## 2017-02-17 ENCOUNTER — Ambulatory Visit: Payer: Medicare PPO | Admitting: Allergy and Immunology

## 2017-02-17 DIAGNOSIS — J309 Allergic rhinitis, unspecified: Secondary | ICD-10-CM

## 2017-04-21 ENCOUNTER — Ambulatory Visit (INDEPENDENT_AMBULATORY_CARE_PROVIDER_SITE_OTHER): Payer: Medicare PPO | Admitting: Allergy and Immunology

## 2017-04-21 ENCOUNTER — Other Ambulatory Visit: Payer: Self-pay | Admitting: *Deleted

## 2017-04-21 ENCOUNTER — Encounter: Payer: Self-pay | Admitting: Allergy and Immunology

## 2017-04-21 VITALS — BP 118/78 | HR 84 | Temp 97.6°F | Resp 20 | Ht 70.0 in | Wt 152.4 lb

## 2017-04-21 DIAGNOSIS — J45901 Unspecified asthma with (acute) exacerbation: Secondary | ICD-10-CM | POA: Diagnosis not present

## 2017-04-21 DIAGNOSIS — J0100 Acute maxillary sinusitis, unspecified: Secondary | ICD-10-CM

## 2017-04-21 DIAGNOSIS — J3089 Other allergic rhinitis: Secondary | ICD-10-CM | POA: Diagnosis not present

## 2017-04-21 MED ORDER — AZITHROMYCIN 250 MG PO TABS: ORAL_TABLET | ORAL | 0 refills | Status: DC

## 2017-04-21 MED ORDER — PREDNISONE 1 MG PO TABS: 10.0000 mg | ORAL_TABLET | Freq: Every day | ORAL | Status: AC

## 2017-04-21 MED ORDER — LEVOCETIRIZINE DIHYDROCHLORIDE 5 MG PO TABS: 5.0000 mg | ORAL_TABLET | Freq: Every evening | ORAL | 5 refills | Status: DC

## 2017-04-21 NOTE — Assessment & Plan Note (Signed)
   Prednisone and azithromycin have been prescribed, (as above).  For now, take azelastine nasal spray, 2 sprays per nostril twice daily, and add fluticasone nasal spray, 2 sprays per nostril once daily.  Nasal saline lavage (NeilMed) has been recommended as needed and prior to medicated nasal sprays along with instructions for proper administration.  For thick post nasal drainage, add guaifenesin 1200 mg (Mucinex Maximum Strength)  twice daily as needed with adequate hydration as discussed.

## 2017-04-21 NOTE — Progress Notes (Addendum)
Follow-up Note  RE: Travis MetroKeith L Mcmaster MRN: [id number] DOB: 1961/06/25 Date of Office Visit: 04/21/2017  Primary care provider: Jonny RuizLlibre, Giovanni, MD Referring provider: Jonny RuizLlibre, Giovanni, MD  History of present illness: Travis Bryant is a 56 y.o. male with persistent asthma, allergic rhinitis, and gastroesophageal reflux presenting today for sick visit.  He reports that over the past week he has been experiencing a persistent cough, chest congestion, and wheezing.  He reports that he was unable to sleep last night because of his lower respiratory symptoms.  He has had increased albuterol use over the past couple days.  He tried Mucinex which provided some benefit but "not enough."  He has been experiencing thick postnasal drainage and is coughed up some "greenish white" mucus.  He reports that he was feverish on Thursday and Friday.   Assessment and plan: Asthma with acute exacerbation  Prednisone has been provided, 40 mg x3 days, 20 mg x1 day, 10 mg x1 day, then stop.  During respiratory tract infections or asthma flares, increase Flovent 220g to 3 inhalations 3 times per day.  Once symptoms are returned baseline he may resume previous dose.   Given history of fever and discolored mucus, a prescription has been provided for azithromycin, 500 mg on day 1 and 250 mg on days 2 through 5.  Continue albuterol every 4-6 hours as needed.  The patient has been asked to contact me if his symptoms persist or progress. Otherwise, he may return for follow up in 4 months.  Acute sinusitis  Prednisone and azithromycin have been prescribed, (as above).  For now, take azelastine nasal spray, 2 sprays per nostril twice daily, and add fluticasone nasal spray, 2 sprays per nostril once daily.  Nasal saline lavage (NeilMed) has been recommended as needed and prior to medicated nasal sprays along with instructions for proper administration.  For thick post nasal drainage, add guaifenesin 1200 mg (Mucinex  Maximum Strength)  twice daily as needed with adequate hydration as discussed.  Seasonal and perennial allergic rhinitis  Continue appropriate allergen avoidance measures.  Treatment plan as outlined above for acute sinusitis.  If allergen avoidance measures and medications fail to adequately relieve symptoms, aeroallergen immunotherapy will be considered.   Meds ordered this encounter  Medications  . levocetirizine (XYZAL) 5 MG tablet    Sig: Take 1 tablet (5 mg total) by mouth every evening.    Dispense:  30 tablet    Refill:  5  . azithromycin (ZITHROMAX Z-PAK) 250 MG tablet    Sig: 500 mg (2 tablets) on day 1 and 250 mg (1 tablet) on days 2 through 5.    Dispense:  6 each    Refill:  0  . predniSONE (DELTASONE) tablet 10 mg    Diagnostics: Spirometry:  FVC was 3.33 L and FEV1 was 3.14 L without significant postbronchodilator improvement. Please see scanned spirometry results for details.    Physical examination: Blood pressure 118/78, pulse 84, temperature 97.6 F (36.4 C), temperature source Oral, resp. rate 20, height 5\' 10"  (1.778 m), weight 152 lb 6.4 oz (69.1 kg), SpO2 96 %.  General: Alert, interactive, in no acute distress. HEENT: TMs pearly gray, turbinates edematous without discharge, post-pharynx moderately erythematous. Neck: Supple without lymphadenopathy. Lungs: Clear to auscultation without wheezing, rhonchi or rales. CV: Normal S1, S2 without murmurs. Skin: Warm and dry, without lesions or rashes.  The following portions of the patient's history were reviewed and updated as appropriate: allergies, current medications, past family history,  past medical history, past social history, past surgical history and problem list.  Allergies as of 04/21/2017   No Known Allergies     Medication List       Accurate as of 04/21/17  7:34 PM. Always use your most recent med list.          acetaminophen 325 MG tablet Commonly known as:  TYLENOL Take 650 mg by  mouth every 6 (six) hours as needed (for pain).   albuterol 108 (90 Base) MCG/ACT inhaler Commonly known as:  PROVENTIL HFA;VENTOLIN HFA Inhale 2 puffs into the lungs every 6 (six) hours as needed for wheezing or shortness of breath.   amLODipine 5 MG tablet Commonly known as:  NORVASC Take 5 mg by mouth daily.   Azelastine HCl 0.15 % Soln 1-2 sprays per nostril 2 times daily   azithromycin 250 MG tablet Commonly known as:  ZITHROMAX Z-PAK 500 mg (2 tablets) on day 1 and 250 mg (1 tablet) on days 2 through 5.   diclofenac 75 MG EC tablet Commonly known as:  VOLTAREN Take 75 mg by mouth 2 (two) times daily as needed for mild pain.   fluticasone 220 MCG/ACT inhaler Commonly known as:  FLOVENT HFA Inhale 1 puff into the lungs 2 (two) times daily.   fluticasone 50 MCG/ACT nasal spray Commonly known as:  FLONASE Place 2 sprays into both nostrils daily.   levocetirizine 5 MG tablet Commonly known as:  XYZAL Take 1 tablet (5 mg total) by mouth every evening.   tadalafil 5 MG tablet Commonly known as:  CIALIS Take 5 mg by mouth.   tamsulosin 0.4 MG Caps capsule Commonly known as:  FLOMAX take 2 tabs by mouth daily            Discharge Care Instructions        Start     Ordered   04/22/17 0800  PREDNISONE 1 MG PO TABS  Daily with breakfast     04/21/17 1722   04/21/17 0000  Spirometry with Graph    Question Answer Comment  Where should this test be performed? Other   Basic spirometry No   Spirometry pre & post bronchodilator Yes      04/21/17 1722   04/21/17 0000  levocetirizine (XYZAL) 5 MG tablet  Every evening     04/21/17 1722   04/21/17 0000  azithromycin (ZITHROMAX Z-PAK) 250 MG tablet     04/21/17 1722      No Known Allergies  Review of systems: Review of systems negative except as noted in HPI / PMHx or noted below: Constitutional: Negative.  HENT: Negative.   Eyes: Negative.  Respiratory: Negative.   Cardiovascular: Negative.    Gastrointestinal: Negative.  Genitourinary: Negative.  Musculoskeletal: Negative.  Neurological: Negative.  Endo/Heme/Allergies: Negative.  Cutaneous: Negative.  Past Medical History:  Diagnosis Date  . Asthma   . Benign enlargement of prostate   . Bladder disease   . Environmental allergies   . Hematuria   . 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 History of headache   . HIV exposure   . HTN (hypertension)   . Memory change 01/25/2013  . Restless legs syndrome (RLS) 01/14/2014    Family History  Problem Relation Age of Onset  . Lupus Mother   . Arthritis/Rheumatoid Mother   . Hypertension Father   . Heart disease Father   . Sarcoidosis Sister   . Cancer Maternal Grandmother   .  Heart disease Maternal Grandfather   . Cancer - Prostate Paternal Grandfather   . Allergic rhinitis Neg Hx   . Angioedema Neg Hx   . Asthma Neg Hx   . Eczema Neg Hx   . Immunodeficiency Neg Hx   . Urticaria Neg Hx     Social History   Social History  . Marital status: Single    Spouse name: N/A  . Number of children: 0  . Years of education: college   Occupational History  . Teacher-part time [employer]   Social History Main Topics  . Smoking status: Former Smoker    Packs/day: 0.10    Years: 20.00    Types: Cigarettes    Quit date: 10/11/2012  . Smokeless tobacco: Never Used  . Alcohol use 0.0 oz/week     Comment: socially  . Drug use: No  . Sexual activity: Not on file   Other Topics Concern  . Not on file   Social History Narrative   Patient is left handed.   Patient drinks 3 glasses caffeine daily    I appreciate the opportunity to take part in [place] care. Please do not hesitate to contact me with questions.  Sincerely,   R. Jorene Guest, MD

## 2017-04-21 NOTE — Assessment & Plan Note (Signed)
   Prednisone has been provided, 40 mg x3 days, 20 mg x1 day, 10 mg x1 day, then stop.  During respiratory tract infections or asthma flares, increase Flovent 220g to 3 inhalations 3 times per day.  Once symptoms are returned baseline he may resume previous dose.   Given history of fever and discolored mucus, a prescription has been provided for azithromycin, 500 mg on day 1 and 250 mg on days 2 through 5.  Continue albuterol every 4-6 hours as needed.  The patient has been asked to contact me if his symptoms persist or progress. Otherwise, he may return for follow up in 4 months.

## 2017-04-21 NOTE — Assessment & Plan Note (Signed)
   Continue appropriate allergen avoidance measures.  Treatment plan as outlined above for acute sinusitis.  If allergen avoidance measures and medications fail to adequately relieve symptoms, aeroallergen immunotherapy will be considered. 

## 2017-04-21 NOTE — Patient Instructions (Addendum)
Asthma with acute exacerbation  Prednisone has been provided, 40 mg x3 days, 20 mg x1 day, 10 mg x1 day, then stop.  During respiratory tract infections or asthma flares, increase Flovent 220g to 3 inhalations 3 times per day.  Once symptoms are returned baseline he may resume previous dose.   Given history of fever and discolored mucus, a prescription has been provided for azithromycin, 500 mg on day 1 and 250 mg on days 2 through 5.  Continue albuterol every 4-6 hours as needed.  The patient has been asked to contact me if his symptoms persist or progress. Otherwise, he may return for follow up in 4 months.  Acute sinusitis  Prednisone and azithromycin have been prescribed, (as above).  For now, take azelastine nasal spray, 2 sprays per nostril twice daily, and add fluticasone nasal spray, 2 sprays per nostril once daily.  Nasal saline lavage (NeilMed) has been recommended as needed and prior to medicated nasal sprays along with instructions for proper administration.  For thick post nasal drainage, add guaifenesin 1200 mg (Mucinex Maximum Strength)  twice daily as needed with adequate hydration as discussed.  Seasonal and perennial allergic rhinitis  Continue appropriate allergen avoidance measures.  Treatment plan as outlined above for acute sinusitis.  If allergen avoidance measures and medications fail to adequately relieve symptoms, aeroallergen immunotherapy will be considered.   Return in about 4 months (around 08/21/2017), or if symptoms worsen or fail to improve.

## 2017-04-28 ENCOUNTER — Telehealth: Payer: Self-pay | Admitting: Allergy and Immunology

## 2017-04-28 MED ORDER — PREDNISONE 20 MG PO TABS: 20.0000 mg | ORAL_TABLET | Freq: Every day | ORAL | 0 refills | Status: DC

## 2017-04-28 MED ORDER — AMOXICILLIN-POT CLAVULANATE 875-125 MG PO TABS: 1.0000 | ORAL_TABLET | Freq: Two times a day (BID) | ORAL | 0 refills | Status: AC

## 2017-04-28 NOTE — Telephone Encounter (Signed)
Left message to call office

## 2017-04-28 NOTE — Telephone Encounter (Signed)
Patient informed that the medication was called into the pharmacy.

## 2017-04-28 NOTE — Telephone Encounter (Signed)
Spoke to patient and he stated that he has finished all the medication that Dr. Nunzio Cobbs prescribe for him. He stated that he is still coughing and feels bad. Please advise

## 2017-04-28 NOTE — Telephone Encounter (Signed)
Will broaden coverage to Augmentin  BID x 10days since minimal improvement on Azithro. Also provide prednisone  x 5 days.  He should continue his routine medication and would encourage use of Mucinex  daily with plenty of water to help thin/loosen mucus

## 2017-04-28 NOTE — Telephone Encounter (Signed)
Pt called and his is still coughing bad. Came in on Monday the 10th and he is still sick [facility] on [address]. [phone].

## 2017-05-05 ENCOUNTER — Ambulatory Visit (HOSPITAL_COMMUNITY)
Admission: EM | Admit: 2017-05-05 | Discharge: 2017-05-05 | Disposition: A | Discharge status: Home / Self Care | Payer: Medicare PPO | Attending: Urgent Care | Admitting: Urgent Care

## 2017-05-05 ENCOUNTER — Encounter (HOSPITAL_COMMUNITY): Payer: Self-pay | Admitting: *Deleted

## 2017-05-05 DIAGNOSIS — R059 Cough, unspecified: Secondary | ICD-10-CM

## 2017-05-05 DIAGNOSIS — R05 Cough: Secondary | ICD-10-CM

## 2017-05-05 DIAGNOSIS — J4540 Moderate persistent asthma, uncomplicated: Secondary | ICD-10-CM

## 2017-05-05 MED ORDER — HYDROCODONE-HOMATROPINE 5-1.5 MG/5ML PO SYRP: 5.0000 mL | ORAL_SOLUTION | Freq: Every evening | ORAL | 0 refills | Status: DC | PRN

## 2017-05-05 MED ORDER — BENZONATATE 100 MG PO CAPS: 100.0000 mg | ORAL_CAPSULE | Freq: Three times a day (TID) | ORAL | 0 refills | Status: DC | PRN

## 2017-05-05 NOTE — ED Triage Notes (Signed)
Cough   And   Congestion   X   3   Weeks      Seen  By  Allergist  2   Weeks    Took   A  course  Of  Prednisone   And      z  Pack     And  Allergist      Pt  Reports still  Having     A cough

## 2017-05-05 NOTE — ED Provider Notes (Signed)
MRN: [id number] DOB: [birth year]  Subjective:   Travis Bryant is a 56 y.o. male presenting for chief complaint of Cough  Reports more than 3 week history of mildly productive cough without hemoptysis. Had nausea with 1 episode of vomiting initially. Patient was started on prednisone and is currently finishing course of Augmentin and has already completed azithromycin. Has a history of asthma. Patient has a nebulizer that he is using twice daily. Denies fever, chest pain, abdominal pain. Prior to his illness, patient was using albuterol inhaler 4-5 times weekly. Denies smoking cigarettes. Does not hydrate well.   No current facility-administered medications for this encounter.   Current Outpatient Prescriptions:  .  acetaminophen (TYLENOL) 325 MG tablet, Take 650 mg by mouth every 6 (six) hours as needed (for pain). , Disp: , Rfl:  .  albuterol (PROVENTIL HFA;VENTOLIN HFA) 108 (90 Base) MCG/ACT inhaler, Inhale 2 puffs into the lungs every 6 (six) hours as needed for wheezing or shortness of breath., Disp: 1 Inhaler, Rfl: 1 .  amLODipine (NORVASC) 5 MG tablet, Take 5 mg by mouth daily., Disp: , Rfl: 0 .  amoxicillin-clavulanate (AUGMENTIN) 875-125 MG tablet, Take 1 tablet by mouth 2 (two) times daily., Disp: 20 tablet, Rfl: 0 .  Azelastine HCl 0.15 % SOLN, 1-2 sprays per nostril 2 times daily, Disp: 30 mL, Rfl: 5 .  azithromycin (ZITHROMAX Z-PAK) 250 MG tablet, 500 mg (2 tablets) on day 1 and 250 mg (1 tablet) on days 2 through 5., Disp: 6 each, Rfl: 0 .  benzonatate (TESSALON) 100 MG capsule, Take 1-2 capsules (100-200 mg total) by mouth 3 (three) times daily as needed for cough., Disp: 60 capsule, Rfl: 0 .  diclofenac (VOLTAREN) 75 MG EC tablet, Take 75 mg by mouth 2 (two) times daily as needed for mild pain., Disp: , Rfl:  .  fluticasone (FLONASE) 50 MCG/ACT nasal spray, Place 2 sprays into both nostrils daily., Disp: 16 g, Rfl: 5 .  fluticasone (FLOVENT HFA) 220 MCG/ACT inhaler, Inhale 1 puff into  the lungs 2 (two) times daily. (Patient taking differently: Inhale 1 puff into the lungs as needed. ), Disp: 1 Inhaler, Rfl: 5 .  HYDROcodone-homatropine (HYCODAN) 5-1.5 MG/5ML syrup, Take 5 mLs by mouth at bedtime as needed., Disp: 50 mL, Rfl: 0 .  levocetirizine (XYZAL) 5 MG tablet, Take 1 tablet (5 mg total) by mouth every evening., Disp: 30 tablet, Rfl: 5 .  predniSONE (DELTASONE) 20 MG tablet, Take 1 tablet (20 mg total) by mouth daily with breakfast., Disp: 5 tablet, Rfl: 0 .  tadalafil (CIALIS) 5 MG tablet, Take 5 mg by mouth., Disp: , Rfl:  .  tamsulosin (FLOMAX) 0.4 MG CAPS capsule, take 2 tabs by mouth daily, Disp: , Rfl:    Travis Bryant has No Known Allergies.  Travis Bryant  has a past medical history of Asthma; Benign enlargement of prostate; Bladder disease; Environmental allergies; Hematuria; History of closed head injury (1966); History of headache; HIV exposure; HTN (hypertension); Memory change (01/25/2013); and Restless legs syndrome (RLS) (01/14/2014). Also  has a past surgical history that includes Abdominal surgery and Brain surgery.  Objective:   Vitals: BP 128/82 (BP Location: Right Arm)   Pulse 92   Temp 98.6 F (37 C) (Oral)   Resp 18   SpO2 98%   Physical Exam  Constitutional: He is oriented to person, place, and time. He appears well-developed and well-nourished.  HENT:  Mouth/Throat: Oropharynx is clear and moist.  Eyes: No scleral icterus.  Neck: Normal  range of motion. Neck supple.  Cardiovascular: Normal rate, regular rhythm and intact distal pulses.  Exam reveals no gallop and no friction rub.   No murmur heard. Pulmonary/Chest: No respiratory distress. He has no wheezes. He has no rales.  Lymphadenopathy:    He has no cervical adenopathy.  Neurological: He is alert and oriented to person, place, and time.   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 He will use cough  suppression medications. Continue albuterol twice daily. Use Zantac for cough as a manifestation of GERD given his history of reflux and current symptom of worsening cough after eating. Return-to-clinic precautions discussed, patient verbalized understanding.   Wallis Bamberg, PA-C [facility]  05/05/2017  5:37 PM    Wallis Bamberg, PA-C 05/05/17 1751

## 2017-05-05 NOTE — Discharge Instructions (Signed)
To address acid reflux as a source of your cough, please take Zantac  twice daily at least 1 hour prior to eating. Do not use any other cough syrup with Hycodan.   For sore throat try using a honey-based tea. Use 3 teaspoons of honey with juice squeezed from half lemon. Place shaved pieces of ginger into 1/2-1 cup of water and warm over stove top. Then mix the ingredients and repeat every 4 hours as needed.

## 2017-05-08 ENCOUNTER — Ambulatory Visit (INDEPENDENT_AMBULATORY_CARE_PROVIDER_SITE_OTHER): Payer: Medicare PPO

## 2017-05-08 ENCOUNTER — Ambulatory Visit (HOSPITAL_COMMUNITY)
Admission: EM | Admit: 2017-05-08 | Discharge: 2017-05-08 | Disposition: A | Discharge status: Home / Self Care | Payer: Medicare PPO | Attending: Family Medicine | Admitting: Family Medicine

## 2017-05-08 ENCOUNTER — Encounter (HOSPITAL_COMMUNITY): Payer: Self-pay | Admitting: Emergency Medicine

## 2017-05-08 DIAGNOSIS — J40 Bronchitis, not specified as acute or chronic: Secondary | ICD-10-CM | POA: Diagnosis not present

## 2017-05-08 DIAGNOSIS — R059 Cough, unspecified: Secondary | ICD-10-CM

## 2017-05-08 DIAGNOSIS — R05 Cough: Secondary | ICD-10-CM

## 2017-05-08 MED ORDER — METHYLPREDNISOLONE SODIUM SUCC 125 MG IJ SOLR: 125.0000 mg | Freq: Once | INTRAMUSCULAR | Status: DC

## 2017-05-08 MED ORDER — IPRATROPIUM-ALBUTEROL 0.5-2.5 (3) MG/3ML IN SOLN
3.0000 mL | Freq: Once | RESPIRATORY_TRACT | Status: AC
  Administered 2017-05-08: 3 mL via RESPIRATORY_TRACT

## 2017-05-08 MED ORDER — SODIUM CHLORIDE 0.9 % IN NEBU
INHALATION_SOLUTION | RESPIRATORY_TRACT | Status: AC
  Filled 2017-05-08: qty 3

## 2017-05-08 MED ORDER — IPRATROPIUM-ALBUTEROL 0.5-2.5 (3) MG/3ML IN SOLN
RESPIRATORY_TRACT | Status: AC
  Filled 2017-05-08: qty 3

## 2017-05-08 MED ORDER — METHYLPREDNISOLONE SODIUM SUCC 125 MG IJ SOLR
125.0000 mg | Freq: Once | INTRAMUSCULAR | Status: AC
  Administered 2017-05-08: 125 mg via INTRAMUSCULAR

## 2017-05-08 MED ORDER — METHYLPREDNISOLONE SODIUM SUCC 125 MG IJ SOLR
INTRAMUSCULAR | Status: AC
  Filled 2017-05-08: qty 2

## 2017-05-08 NOTE — ED Provider Notes (Signed)
[facility]    CSN: [id number] Arrival date & time: 05/08/17  1443     History   Chief Complaint Chief Complaint  Patient presents with  . Cough    HPI Travis Bryant is a 56 y.o. male.   HPI  Past Medical History:  Diagnosis Date  . Asthma   . Benign enlargement of prostate   . Bladder disease   . Environmental allergies   . Hematuria   . 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 History of headache   . HIV exposure   . HTN (hypertension)   . Memory change 01/25/2013  . Restless legs syndrome (RLS) 01/14/2014    Patient Active Problem List   Diagnosis Date Noted  . Asthma with acute exacerbation 04/21/2017  . Hypertensive disorder 01/02/2017  . Benign essential HTN 12/16/2016  . Dyspnea 12/16/2016  . Memory impairment 11/06/2016  . Disorder of bladder 08/22/2016  . Injury due to exposure to external cause 08/22/2016  . History of hematuria 08/22/2016  . 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 Increased frequency of urination 08/22/2016  . History of head injury 07/30/2016  . Acute sinusitis 12/27/2015  . Mild persistent asthma 12/27/2015  . Seasonal and perennial allergic rhinitis 12/27/2015  . Gastroesophageal reflux disease without esophagitis 12/27/2015  . Persistent cough 12/27/2015  . Tendinitis of shoulder 08/29/2015  . Benign prostatic hyperplasia without urinary obstruction 06/21/2015  . History of tuberculosis 06/21/2015  . Asthma 06/19/2015  . Environmental allergies 06/19/2015  . 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 Restless legs 01/14/2014  . Memory change 01/25/2013  . Head injury 12/12/2011  . Migraine 12/12/2011    Past Surgical History:  Procedure Laterality Date  . ABDOMINAL SURGERY    . BRAIN SURGERY         Home Medications    Prior to Admission medications   Medication Sig Start Date End Date Taking? Authorizing Provider  benzonatate (TESSALON) 100 MG capsule Take 1-2  capsules (100-200 mg total) by mouth 3 (three) times daily as needed for cough. 05/05/17  Yes Wallis Bamberg, PA-C  HYDROcodone-homatropine [facility]) 5-1.5 MG/5ML syrup Take 5 mLs by mouth at bedtime as needed. 05/05/17  Yes Wallis Bamberg, PA-C  acetaminophen (TYLENOL) 325 MG tablet Take 650 mg by mouth every 6 (six) hours as needed (for pain).     [provider]  albuterol (PROVENTIL HFA;VENTOLIN HFA) 108 (90 Base) MCG/ACT inhaler Inhale 2 puffs into the lungs every 6 (six) hours as needed for wheezing or shortness of breath. 10/15/16   Bobbitt, Heywood Iles, MD  amLODipine (NORVASC) 5 MG tablet Take 5 mg by mouth daily. 07/28/15   [provider]  amoxicillin-clavulanate (AUGMENTIN) 875-125 MG tablet Take 1 tablet by mouth 2 (two) times daily. 04/28/17 05/08/17  Marcelyn Bruins, MD  Azelastine HCl 0.15 % SOLN 1-2 sprays per nostril 2 times daily 12/17/16   Bobbitt, Heywood Iles, MD  azithromycin (ZITHROMAX Z-PAK) 250 MG tablet 500 mg (2 tablets) on day 1 and 250 mg (1 tablet) on days 2 through 5. 04/21/17   Bobbitt, Heywood Iles, MD  diclofenac (VOLTAREN) 75 MG EC tablet Take 75 mg by mouth 2 (two) times daily as needed for mild pain.    [provider]  fluticasone (FLONASE) 50 MCG/ACT nasal spray Place 2 sprays into both nostrils daily. 12/27/15 04/21/17  Bobbitt, Heywood Iles, MD  fluticasone (FLOVENT HFA) 220 MCG/ACT inhaler Inhale 1 puff  into the lungs 2 (two) times daily. Patient taking differently: Inhale 1 puff into the lungs as needed.  10/15/16   Bobbitt, Heywood Iles, MD  levocetirizine (XYZAL) 5 MG tablet Take 1 tablet (5 mg total) by mouth every evening. 04/21/17   Bobbitt, Heywood Iles, MD  predniSONE (DELTASONE) 20 MG tablet Take 1 tablet (20 mg total) by mouth daily with breakfast. 04/28/17   Marcelyn Bruins, MD  tadalafil (CIALIS) 5 MG tablet Take 5 mg by mouth. 04/15/17 05/15/17  [provider]  tamsulosin (FLOMAX) 0.4 MG CAPS capsule take 2 tabs  by mouth daily 08/21/16   [provider]    Family History Family History  Problem Relation Age of Onset  . Lupus Mother   . Arthritis/Rheumatoid Mother   . Hypertension Father   . Heart disease Father   . Sarcoidosis Sister   . Cancer Maternal Grandmother   . Heart disease Maternal Grandfather   . Cancer - Prostate Paternal Grandfather   . Allergic rhinitis Neg Hx   . Angioedema Neg Hx   . Asthma Neg Hx   . Eczema Neg Hx   . Immunodeficiency Neg Hx   . Urticaria Neg Hx     Social History Social History  Substance Use Topics  . Smoking status: Former Smoker    Packs/day: 0.10    Years: 20.00    Types: Cigarettes    Quit date: 10/11/2012  . Smokeless tobacco: Never Used  . Alcohol use 0.0 oz/week     Comment: socially     Allergies   Patient has no known allergies.   Review of Systems Review of Systems   Physical Exam Triage Vital Signs ED Triage Vitals [05/08/17 1535]  Enc Vitals Group     BP 113/70     Pulse Rate 93     Resp      Temp 98.1 F (36.7 C)     Temp Source Oral     SpO2 100 %     Weight      Height      Head Circumference      Peak Flow      Pain Score 0     Pain Loc      Pain Edu?      Excl. in GC?    No data found.   Updated Vital Signs BP 113/70 (BP Location: Right Arm)   Pulse 93   Temp 98.1 F (36.7 C) (Oral)   SpO2 100%   Visual Acuity Right Eye Distance:   Left Eye Distance:   Bilateral Distance:    Right Eye Near:   Left Eye Near:    Bilateral Near:     Physical Exam   UC Treatments / Results  Labs (all labs ordered are listed, but only abnormal results are displayed) Labs Reviewed - No data to display  EKG  EKG Interpretation None       Radiology Dg Chest 2 View  Result Date: 05/08/2017 CLINICAL DATA:  Cough for 3 weeks productive of white sputum, low-grade fever this week, slight head congestion, history chronic asthma EXAM: CHEST  2 VIEW COMPARISON:  12/09/2015 FINDINGS: Normal heart  size, mediastinal contours, and pulmonary vascularity. Lungs clear. No pleural effusion or pneumothorax. Bones unremarkable. IMPRESSION: Normal exam. Electronically Signed   By: Ulyses Southward M.D.   On: 05/08/2017 17:04    Procedures Procedures (including critical care time)  Medications Ordered in UC Medications  methylPREDNISolone sodium succinate (SOLU-MEDROL) 125 mg/2  mL injection 125 mg (not administered)  ipratropium-albuterol (DUONEB) 0.5-2.5 (3) MG/3ML nebulizer solution 3 mL (3 mLs Nebulization Given 05/08/17 1716)     Initial Impression / Assessment and Plan / UC Course  I have reviewed the triage vital signs and the nursing notes.  Pertinent labs & imaging results that were available during my care of the patient were reviewed by me and considered in my medical decision making (see chart for details).    Use your inhaler as needed Given steroid shot in office since you state that you have been taking steroids with no relief,  Take cough meds as needed  You chest x ray is normal   Follow up with your primary doctor , if symptoms worse go to the emergency room   Final Clinical Impressions(s) / UC Diagnoses   Final diagnoses:  Bronchitis  Cough    New Prescriptions New Prescriptions   No medications on file     Controlled Substance Prescriptions [place] Controlled Substance Registry consulted? Not Applicable   Coralyn Mark, NP 05/08/17 1735

## 2017-05-08 NOTE — Discharge Instructions (Signed)
Use your inhaler as needed Given steroid shot in office since you state that you have been taking steroids with no relief,  Take cough meds as needed  You chest x ray is normal   Follow up with your primary doctor , if symptoms worse go to the emergency room

## 2017-05-08 NOTE — ED Triage Notes (Signed)
Pt was seen here 3 days ago for a cough.  He was sent home with Tessalon and a cough syrup which he states is not working.  Pt would like a chest xray today.  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  Pt was treated for it at the time.

## 2017-05-08 NOTE — ED Provider Notes (Signed)
[facility]    CSN: [id number] Arrival date & time: 05/08/17  1443     History   Chief Complaint Chief Complaint  Patient presents with  . Cough    HPI Travis Bryant is a 56 y.o. male.   56 year old male states he was here 2 days ago for cough that had been lasting for 3 weeks. He states that he had been taking antibiotics including azithromycin and Augmentin which she continues to take, using nebs at home and was asked to continue albuterol HFA release twice a day and to started and Zantac as a treatment for possible reflux. It was noted that his lungs were clear at this most recent visit. He also notes that he had tuberculosis 45 years ago and was treated for about 4 months. He continues to cough despite taking cough medicine as well.      Past Medical History:  Diagnosis Date  . Asthma   . Benign enlargement of prostate   . Bladder disease   . Environmental allergies   . Hematuria   . 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 History of headache   . HIV exposure   . HTN (hypertension)   . Memory change 01/25/2013  . Restless legs syndrome (RLS) 01/14/2014    Patient Active Problem List   Diagnosis Date Noted  . Asthma with acute exacerbation 04/21/2017  . Hypertensive disorder 01/02/2017  . Benign essential HTN 12/16/2016  . Dyspnea 12/16/2016  . Memory impairment 11/06/2016  . Disorder of bladder 08/22/2016  . Injury due to exposure to external cause 08/22/2016  . History of hematuria 08/22/2016  . 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 Increased frequency of urination 08/22/2016  . History of head injury 07/30/2016  . Acute sinusitis 12/27/2015  . Mild persistent asthma 12/27/2015  . Seasonal and perennial allergic rhinitis 12/27/2015  . Gastroesophageal reflux disease without esophagitis 12/27/2015  . Persistent cough 12/27/2015  . Tendinitis of shoulder 08/29/2015  . Benign prostatic hyperplasia  without urinary obstruction 06/21/2015  . History of tuberculosis 06/21/2015  . Asthma 06/19/2015  . Environmental allergies 06/19/2015  . 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 Restless legs 01/14/2014  . Memory change 01/25/2013  . Head injury 12/12/2011  . Migraine 12/12/2011    Past Surgical History:  Procedure Laterality Date  . ABDOMINAL SURGERY    . BRAIN SURGERY         Home Medications    Prior to Admission medications   Medication Sig Start Date End Date Taking? Authorizing Provider  benzonatate (TESSALON) 100 MG capsule Take 1-2 capsules (100-200 mg total) by mouth 3 (three) times daily as needed for cough. 05/05/17  Yes Wallis Bamberg, PA-C  HYDROcodone-homatropine [facility]) 5-1.5 MG/5ML syrup Take 5 mLs by mouth at bedtime as needed. 05/05/17  Yes Wallis Bamberg, PA-C  acetaminophen (TYLENOL) 325 MG tablet Take 650 mg by mouth every 6 (six) hours as needed (for pain).     [provider]  albuterol (PROVENTIL HFA;VENTOLIN HFA) 108 (90 Base) MCG/ACT inhaler Inhale 2 puffs into the lungs every 6 (six) hours as needed for wheezing or shortness of breath. 10/15/16   Bobbitt, Heywood Iles, MD  amLODipine (NORVASC) 5 MG tablet Take 5 mg by mouth daily. 07/28/15   [provider]  Azelastine HCl 0.15 % SOLN 1-2 sprays per nostril 2 times daily 12/17/16   Bobbitt, Heywood Iles, MD  azithromycin (ZITHROMAX Z-PAK) 250 MG tablet  500 mg (2 tablets) on day 1 and 250 mg (1 tablet) on days 2 through 5. 04/21/17   Bobbitt, Heywood Iles, MD  diclofenac (VOLTAREN) 75 MG EC tablet Take 75 mg by mouth 2 (two) times daily as needed for mild pain.    [provider]  fluticasone (FLONASE) 50 MCG/ACT nasal spray Place 2 sprays into both nostrils daily. 12/27/15 04/21/17  Bobbitt, Heywood Iles, MD  fluticasone (FLOVENT HFA) 220 MCG/ACT inhaler Inhale 1 puff into the lungs 2 (two) times daily. Patient taking differently: Inhale 1 puff into the lungs as needed.  10/15/16   Bobbitt, Heywood Iles, MD  levocetirizine (XYZAL) 5 MG tablet Take 1 tablet (5 mg total) by mouth every evening. 04/21/17   Bobbitt, Heywood Iles, MD  predniSONE (DELTASONE) 20 MG tablet Take 1 tablet (20 mg total) by mouth daily with breakfast. 04/28/17   Marcelyn Bruins, MD  tadalafil (CIALIS) 5 MG tablet Take 5 mg by mouth. 04/15/17 05/15/17  [provider]  tamsulosin (FLOMAX) 0.4 MG CAPS capsule take 2 tabs by mouth daily 08/21/16   [provider]    Family History Family History  Problem Relation Age of Onset  . Lupus Mother   . Arthritis/Rheumatoid Mother   . Hypertension Father   . Heart disease Father   . Sarcoidosis Sister   . Cancer Maternal Grandmother   . Heart disease Maternal Grandfather   . Cancer - Prostate Paternal Grandfather   . Allergic rhinitis Neg Hx   . Angioedema Neg Hx   . Asthma Neg Hx   . Eczema Neg Hx   . Immunodeficiency Neg Hx   . Urticaria Neg Hx     Social History Social History  Substance Use Topics  . Smoking status: Former Smoker    Packs/day: 0.10    Years: 20.00    Types: Cigarettes    Quit date: 10/11/2012  . Smokeless tobacco: Never Used  . Alcohol use 0.0 oz/week     Comment: socially     Allergies   Patient has no known allergies.   Review of Systems Review of Systems  Constitutional: Positive for activity change. Negative for diaphoresis and fever.  HENT: Positive for congestion and rhinorrhea.   Respiratory: Positive for cough. Negative for shortness of breath.   Cardiovascular: Negative.   Gastrointestinal: Negative.   Neurological: Negative.   All other systems reviewed and are negative.    Physical Exam Triage Vital Signs ED Triage Vitals [05/08/17 1535]  Enc Vitals Group     BP 113/70     Pulse Rate 93     Resp      Temp 98.1 F (36.7 C)     Temp Source Oral     SpO2 100 %     Weight      Height      Head Circumference      Peak Flow      Pain Score 0     Pain Loc      Pain Edu?      Excl.  in GC?    No data found.   Updated Vital Signs BP 113/70 (BP Location: Right Arm)   Pulse 93   Temp 98.1 F (36.7 C) (Oral)   SpO2 100%   Visual Acuity Right Eye Distance:   Left Eye Distance:   Bilateral Distance:    Right Eye Near:   Left Eye Near:    Bilateral Near:     Physical Exam  Constitutional: He is oriented to person, place, and time. He appears well-developed and well-nourished. No distress.  HENT:  Head: Normocephalic and atraumatic.  Unable to visualize oropharynx due to patient's untenable retraction of the tongue  Eyes: EOM are normal.  Neck: Normal range of motion. Neck supple.  Cardiovascular: Normal rate, regular rhythm and normal heart sounds.   Pulmonary/Chest: Effort normal. No respiratory distress.  Prolonged expiratory phase. Forced expiration and cough reveals bilateral coarseness and faint distant wheeze.  Musculoskeletal: He exhibits no edema.  Neurological: He is alert and oriented to person, place, and time. He exhibits normal muscle tone.  Skin: Skin is warm and dry.  Psychiatric: He has a normal mood and affect.  Nursing note and vitals reviewed.    UC Treatments / Results  Labs (all labs ordered are listed, but only abnormal results are displayed) Labs Reviewed - No data to display  EKG  EKG Interpretation None       Radiology No results found.  Procedures Procedures (including critical care time)  Medications Ordered in UC Medications  ipratropium-albuterol (DUONEB) 0.5-2.5 (3) MG/3ML nebulizer solution 3 mL (3 mLs Nebulization Given 05/08/17 1716)  methylPREDNISolone sodium succinate (SOLU-MEDROL) 125 mg/2 mL injection 125 mg (125 mg Intramuscular Given 05/08/17 1759)     Initial Impression / Assessment and Plan / UC Course  I have reviewed the triage vital signs and the nursing notes.  Pertinent labs & imaging results that were available during my care of the patient were reviewed by me and considered in my medical  decision making (see chart for details).       Final Clinical Impressions(s) / UC Diagnoses   Final diagnoses:  Bronchitis  Cough    New Prescriptions Discharge Medication List as of 05/08/2017  5:34 PM       Controlled Substance Prescriptions [place] Controlled Substance Registry consulted? Not Applicable   Hayden Rasmussen, NP 05/10/17 2222

## 2017-05-12 ENCOUNTER — Telehealth: Payer: Self-pay | Admitting: Cardiovascular Disease

## 2017-05-12 NOTE — Telephone Encounter (Signed)
I spoke with pt. He reports cough for about 4 weeks. Has been seen in Urgent Care twice. Has seen allergist once.  He is concerned cough could be related to his heart.  Recent chest X-ray was normal.  Cough is productive and sometimes he has a sour feeling in his throat. No chest pain except for chest soreness when coughing.  No shortness of breath. No other symptoms.  I instructed pt to follow up with primary care. He reports he no longer has a primary care doctor and goes to Urgent Care. He requests to be seen in our office as he is worried this is heart related. I scheduled him to see Jacolyn Reedy, PA tomorrow at 2:15.

## 2017-05-12 NOTE — Telephone Encounter (Signed)
New Message  Pt call requesting to speka with RN. Pt states he has been coughing for about 4 weeks now. Pt would like to discuss with Rn to rule out any connection to his heart issues. Please call back

## 2017-05-13 ENCOUNTER — Encounter: Payer: Self-pay | Admitting: Physician Assistant

## 2017-05-13 ENCOUNTER — Ambulatory Visit (INDEPENDENT_AMBULATORY_CARE_PROVIDER_SITE_OTHER): Payer: Medicare PPO | Admitting: Physician Assistant

## 2017-05-13 VITALS — BP 130/92 | HR 83 | Ht 70.0 in | Wt 151.1 lb

## 2017-05-13 DIAGNOSIS — J45901 Unspecified asthma with (acute) exacerbation: Secondary | ICD-10-CM

## 2017-05-13 DIAGNOSIS — I10 Essential (primary) hypertension: Secondary | ICD-10-CM | POA: Diagnosis not present

## 2017-05-13 DIAGNOSIS — R0789 Other chest pain: Secondary | ICD-10-CM

## 2017-05-13 NOTE — Patient Instructions (Signed)
Your physician recommends that you continue on your current medications as directed. Please refer to the Current Medication list given to you today. Your physician recommends that you schedule a follow-up with your allergist.  Your physician recommends that you schedule a follow-up appointment with Dr. Clifton James.  See below.

## 2017-05-13 NOTE — Progress Notes (Signed)
Cardiology Office Note    Date:  05/13/2017   ID:  Travis Bryant, DOB June 29, 1961, MRN [id number]  PCP:  Travis Ruiz, MD  Cardiologist: Dr. Clifton James  Chief Complaint  Patient presents with  . Cough    History of Present Illness:  Travis Bryant is a 56 y.o. male with history of hypertension, asthma, remote tobacco abuse, and chest pain. Exercise stress test showed no ischemic EKG changes in 2016 and echo normal LV size and function. Chest pain prompting ER visit 11/2015 ruled out for an MI and relieved with GI cocktail. Last saw Dr. Clifton James 05/23/16 and was doing well.  I saw the patient 12/16/16 with shortness of breath and no stool sats at rest of 93% with recent travel to [place]. We ordered a CTA to rule out PE which was normal. 2-D echo 12/30/16 normal LVEF 55-60% with grade 1 DD. I saw him back 01/02/17 and He was having a lot of trouble with seasonal allergies with an exacerbation at that time. Heart rate was up that day felt secondary to asthma medications.  Patient had an asthma acute exacerbation 04/21/17 and was given steroids and antibiotics by his allergist. Was seen in [facility] urgent care 05/05/17 and 05/08/17 with cough and was diagnosed with bronchitis and given a nebulizer and steroid injection. Chest x-ray was normal and told to take off medicines and inhalers as needed. He had also been on antibiotics and nebulizers as well as Zantac for possible reflux. It was noted that he had tuberculosis 45 years ago treated for 4 months.  Patient comes in today to make sure his cough is coming from his heart. He says hydrocodone didn't help him to sleep at night and the [employer] aren't helping as much as he had hoped. He is just frustrated with cough and can hardly work or eat. He is coughing so much that his upper chest hurts to touch.  Past Medical History:  Diagnosis Date  . Asthma   . Benign enlargement of prostate   . Bladder disease   . Environmental allergies   .  Hematuria   . 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 History of headache   . HIV exposure   . HTN (hypertension)   . Memory change 01/25/2013  . Restless legs syndrome (RLS) 01/14/2014    Past Surgical History:  Procedure Laterality Date  . ABDOMINAL SURGERY    . BRAIN SURGERY      Current Medications: Current Meds  Medication Sig  . acetaminophen (TYLENOL) 325 MG tablet Take 650 mg by mouth every 6 (six) hours as needed (for pain).   Marland Kitchen albuterol (PROVENTIL HFA;VENTOLIN HFA) 108 (90 Base) MCG/ACT inhaler Inhale 2 puffs into the lungs every 6 (six) hours as needed for wheezing or shortness of breath.  Marland Kitchen amLODipine (NORVASC) 5 MG tablet Take 5 mg by mouth daily.  . Azelastine HCl 0.15 % SOLN 1-2 sprays per nostril 2 times daily  . azithromycin (ZITHROMAX Z-PAK) 250 MG tablet 500 mg (2 tablets) on day 1 and 250 mg (1 tablet) on days 2 through 5.  . benzonatate (TESSALON) 100 MG capsule Take 1-2 capsules (100-200 mg total) by mouth 3 (three) times daily as needed for cough.  . diclofenac (VOLTAREN) 75 MG EC tablet Take 75 mg by mouth 2 (two) times daily as needed for mild pain.  . fluticasone (FLOVENT HFA) 220 MCG/ACT inhaler Inhale 3 puffs into the lungs daily  as needed.  Marland Kitchen HYDROcodone-homatropine (HYCODAN) 5-1.5 MG/5ML syrup Take 5 mLs by mouth at bedtime as needed.  Marland Kitchen levocetirizine (XYZAL) 5 MG tablet Take 1 tablet (5 mg total) by mouth every evening.  . predniSONE (DELTASONE) 20 MG tablet Take 1 tablet (20 mg total) by mouth daily with breakfast.  . tadalafil (CIALIS) 5 MG tablet Take 5 mg by mouth.  . tamsulosin (FLOMAX) 0.4 MG CAPS capsule take 2 tabs by mouth daily     Allergies:   Patient has no known allergies.   Social History   Social History  . Marital status: Single    Spouse name: N/A  . Number of children: 0  . Years of education: college   Occupational History  . 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 Smoking status: Former Smoker    Packs/day: 0.10    Years: 20.00    Types: Cigarettes    Quit date: 10/11/2012  . Smokeless tobacco: Never Used  . Alcohol use 0.0 oz/week     Comment: socially  . Drug use: No  . Sexual activity: Not Asked   Other Topics Concern  . None   Social History Narrative   Patient is left handed.   Patient drinks 3 glasses caffeine daily     Family History:  The patient's family history includes Arthritis/Rheumatoid in his mother; Cancer in his maternal grandmother; Cancer - Prostate in his paternal grandfather; Heart disease in his father and maternal grandfather; Hypertension in his father; Lupus in his mother; Sarcoidosis in his sister.   ROS:   Please see the history of present illness.    Review of Systems  Constitution: Negative.  HENT: Negative.   Cardiovascular: Negative.   Respiratory: Positive for cough, sleep disturbances due to breathing and wheezing.   Endocrine: Negative.   Hematologic/Lymphatic: Negative.   Musculoskeletal: Negative.   Gastrointestinal: Negative.   Genitourinary: Positive for hesitancy.  Neurological: Negative.    All other systems reviewed and are negative.   PHYSICAL EXAM:   VS:  BP (!) 130/92   Pulse 83   Ht  (1.778 m)   Wt 151 lb 1.9 oz (68.5 kg)   SpO2 98%   BMI 21.68 kg/m   Physical Exam  GEN: Well nourished, well developed, in no acute distress  Neck: no JVD, carotid bruits, or masses Cardiac:RRR; no murmurs, rubs, or gallops  Respiratory:  clear to auscultation bilaterally, normal work of breathing GI: soft, nontender, nondistended, + BS Ext: without cyanosis, clubbing, or edema, Good distal pulses bilaterally Neuro:  Alert and Oriented x 3 Psych: euthymic mood, full affect  Wt Readings from Last 3 Encounters:  05/13/17 151 lb 1.9 oz (68.5 kg)  04/21/17 152 lb 6.4 oz (69.1 kg)  01/29/17 160 lb (72.6 kg)      Studies/Labs Reviewed:   EKG:  EKG is ordered today.  The ekg ordered  today demonstrates Normal sinus rhythm, NSR  Recent Labs: 12/16/2016: BUN 16; Creatinine, Ser 1.28; Potassium 4.4; Sodium 137   Lipid Panel No results found for: CHOL, TRIG, HDL, CHOLHDL, VLDL, LDLCALC, LDLDIRECT  Additional studies/ records that were reviewed today include:  2-D echo 12/30/16 Study Conclusions   - Left ventricle: The cavity size was normal. Systolic function was   normal. The estimated ejection fraction was in the range of 55%   to 60%. Wall motion was normal; there were no regional wall   motion abnormalities. Doppler parameters are consistent  with   abnormal left ventricular relaxation (grade 1 diastolic   dysfunction). There was no evidence of elevated ventricular   filling pressure by Doppler parameters. - Aortic valve: Trileaflet; normal thickness leaflets. There was   trivial regurgitation. - Aortic root: The aortic root was normal in size. - Mitral valve: Structurally normal valve. There was mild   regurgitation. - Left atrium: The atrium was normal in size. - Right ventricle: The cavity size was normal. Wall thickness was   normal. Systolic function was normal. - Right atrium: The atrium was normal in size. - Tricuspid valve: There was mild regurgitation. - Pulmonary arteries: Systolic pressure was within the normal   range. - Inferior vena cava: The vessel was normal in size. - Pericardium, extracardiac: There was no pericardial effusion.   CTA 5/7/18IMPRESSION: 1. No acute findings.  No evidence for acute pulmonary embolus.     Electronically Signed   By: Signa Kell M.D.   On: 12/16/2016 13:47     ASSESSMENT:    1. Chest wall pain   2. Benign essential HTN   3. Asthma with acute exacerbation, unspecified asthma severity, unspecified whether persistent      PLAN:  In order of problems listed above:  Chest wall pain secondary to all of coughing he is having. Tender to touch in his upper chest. EKG normal.Reassurance given. F/U with  Dr. Mike Craze as scheduled.  Benign hypertension patient's blood pressure is up today but he has been coughing and had acute exacerbation. 2 g sodium diet  Asthma with acute exacerbation with a visit to his allergist in to urgent care visits. Recommend he follow-up with his allergist today for further recommendations as he is still coughing.    Medication Adjustments/Labs and Tests Ordered: Current medicines are reviewed at length with the patient today.  Concerns regarding medicines are outlined above.  Medication changes, Labs and Tests ordered today are listed in the Patient Instructions below. Patient Instructions  Your physician recommends that you continue on your current medications as directed. Please refer to the Current Medication list given to you today. Your physician recommends that you schedule a follow-up with your allergist.  Your physician recommends that you schedule a follow-up appointment with Dr. Clifton James.  See below.     Elson Clan, PA-C  05/13/2017 2:49 PM    [facility] [address] [place], [place], [place]  [address] Phone: [phone]; Fax: [phone]

## 2017-05-14 ENCOUNTER — Ambulatory Visit: Payer: Medicare PPO | Admitting: Nurse Practitioner

## 2017-05-19 ENCOUNTER — Ambulatory Visit (INDEPENDENT_AMBULATORY_CARE_PROVIDER_SITE_OTHER): Payer: Medicare PPO | Admitting: Allergy and Immunology

## 2017-05-19 ENCOUNTER — Encounter: Payer: Self-pay | Admitting: Allergy and Immunology

## 2017-05-19 VITALS — BP 120/90 | HR 79 | Temp 97.8°F | Resp 16

## 2017-05-19 DIAGNOSIS — J3089 Other allergic rhinitis: Secondary | ICD-10-CM | POA: Diagnosis not present

## 2017-05-19 DIAGNOSIS — J4530 Mild persistent asthma, uncomplicated: Secondary | ICD-10-CM

## 2017-05-19 DIAGNOSIS — R05 Cough: Secondary | ICD-10-CM

## 2017-05-19 DIAGNOSIS — R053 Chronic cough: Secondary | ICD-10-CM

## 2017-05-19 DIAGNOSIS — K219 Gastro-esophageal reflux disease without esophagitis: Secondary | ICD-10-CM | POA: Diagnosis not present

## 2017-05-19 MED ORDER — RANITIDINE HCL 150 MG PO TABS: 150.0000 mg | ORAL_TABLET | Freq: Two times a day (BID) | ORAL | 5 refills | Status: DC

## 2017-05-19 MED ORDER — OMEPRAZOLE 40 MG PO CPDR: 40.0000 mg | DELAYED_RELEASE_CAPSULE | Freq: Every day | ORAL | 5 refills | Status: DC

## 2017-05-19 NOTE — Progress Notes (Signed)
Follow-up Note  RE: Travis Bryant MRN: [id number] DOB: 25-May-1961 Date of Office Visit: 05/19/2017  Primary care provider: Jonny Ruiz, MD Referring provider: Jonny Ruiz, MD  History of present illness: Travis Bryant is a 56 y.o. male with persistent asthma, allergic rhinitis, and gastroesophageal reflux presenting today for sick visit.  He was last seen in this clinic on 04/21/2017 with an asthma exacerbation and acute sinusitis.  He reports that overall his cough has improved somewhat on the treatment plan as recommended, however he states that he still experiences frequent coughing spells, particularly when eating large meals and when first lying down at bedtime.  He admits to an occasional "sour feeling" in his upper chest.  The cough is described as nonproductive.  He denies fevers, chills, or discolored mucus production.  He notes that he had been taking Zantac, however has not taken this medication over the past couple/few weeks.  He does not complain of copious postnasal drainage, wheezing, chest tightness, or dyspnea.  He did recently see his cardiologist, had a normal EKG, and was asked to follow up here regarding the cough.   Assessment and plan: Cough, persistent Multifactorial, most likely underlying bronchial hyperresponsiveness triggered by reflux/silent reflux.  See treatment plan as outlined below.  Gastroesophageal reflux We will proceed with a therapeutic trial with proton pump inhibitor.  Appropriate reflux list modifications have been provided and discussed.  Restart ranitidine, 150 mg twice a day.  A prescription has been provided for omeprazole 40 mg daily, 30 minutes prior to breakfast.  Mild persistent asthma  Flovent 220 g, one inhalation via spacer device twice a day.   During respiratory tract infections or asthma flares, increase Flovent 220g to 3 inhalations 2 times per day until symptoms have returned to baseline.  Continue albuterol HFA,  1-2 inhalations every 4-6 hours as needed.  Subjective and objective measures of pulmonary function will be followed and the treatment plan will be adjusted accordingly.  Seasonal and perennial allergic rhinitis  Continue appropriate allergen avoidance measures, that he is on nasal spray as needed, and nasal saline irrigation as needed.  For thick post nasal drainage, add guaifenesin [phone] mg (Mucinex)  twice daily as needed with adequate hydration as discussed.  If allergen avoidance measures and medications fail to adequately relieve symptoms, aeroallergen immunotherapy will be considered.   Meds ordered this encounter  Medications  . ranitidine (ZANTAC) 150 MG tablet    Sig: Take 1 tablet (150 mg total) by mouth 2 (two) times daily.    Dispense:  60 tablet    Refill:  5  . omeprazole (PRILOSEC) 40 MG capsule    Sig: Take 1 capsule (40 mg total) by mouth daily. 30 minutes prior to breakfast.    Dispense:  30 capsule    Refill:  5    Diagnostics: Spirometry:  Normal with an FEV1 of 3.66 L (114% predicted).  Please see scanned spirometry results for details.    Physical examination: Blood pressure 120/90, pulse 79, temperature 97.8 F (36.6 C), temperature source Oral, resp. rate 16, SpO2 97 %.  General: Alert, interactive, in no acute distress. HEENT: TMs pearly gray, turbinates moderately edematous without discharge, post-pharynx unremarkable. Neck: Supple without lymphadenopathy. Lungs: Clear to auscultation without wheezing, rhonchi or rales. CV: Normal S1, S2 without murmurs. Skin: Warm and dry, without lesions or rashes.  The following portions of the patient's history were reviewed and updated as appropriate: allergies, current medications, past family history, past medical history, past  social history, past surgical history and problem list.  Allergies as of 05/19/2017   No Known Allergies     Medication List       Accurate as of 05/19/17 12:15 PM. Always use  your most recent med list.          acetaminophen 325 MG tablet Commonly known as:  TYLENOL Take 650 mg by mouth every 6 (six) hours as needed (for pain).   albuterol 108 (90 Base) MCG/ACT inhaler Commonly known as:  PROVENTIL HFA;VENTOLIN HFA Inhale 2 puffs into the lungs every 6 (six) hours as needed for wheezing or shortness of breath.   amLODipine 5 MG tablet Commonly known as:  NORVASC Take 5 mg by mouth daily.   Azelastine HCl 0.15 % Soln 1-2 sprays per nostril 2 times daily   azithromycin 250 MG tablet Commonly known as:  ZITHROMAX Z-PAK 500 mg (2 tablets) on day 1 and 250 mg (1 tablet) on days 2 through 5.   benzonatate 100 MG capsule Commonly known as:  TESSALON Take 1-2 capsules (100-200 mg total) by mouth 3 (three) times daily as needed for cough.   diclofenac 75 MG EC tablet Commonly known as:  VOLTAREN Take 75 mg by mouth 2 (two) times daily as needed for mild pain.   fluticasone 220 MCG/ACT inhaler Commonly known as:  FLOVENT HFA Inhale 3 puffs into the lungs daily as needed.   fluticasone 50 MCG/ACT nasal spray Commonly known as:  FLONASE Place 2 sprays into both nostrils daily.   HYDROcodone-homatropine 5-1.5 MG/5ML syrup Commonly known as:  HYCODAN Take 5 mLs by mouth at bedtime as needed.   levocetirizine 5 MG tablet Commonly known as:  XYZAL Take 1 tablet (5 mg total) by mouth every evening.   omeprazole 40 MG capsule Commonly known as:  PRILOSEC Take 1 capsule (40 mg total) by mouth daily. 30 minutes prior to breakfast.   predniSONE 20 MG tablet Commonly known as:  DELTASONE Take 1 tablet (20 mg total) by mouth daily with breakfast.   ranitidine 150 MG tablet Commonly known as:  ZANTAC Take 1 tablet (150 mg total) by mouth 2 (two) times daily.   tadalafil 5 MG tablet Commonly known as:  CIALIS Take 5 mg by mouth.   tamsulosin 0.4 MG Caps capsule Commonly known as:  FLOMAX take 2 tabs by mouth daily       No Known  Allergies  Review of systems: Review of systems negative except as noted in HPI / PMHx or noted below: Constitutional: Negative.  HENT: Negative.   Eyes: Negative.  Respiratory: Negative.   Cardiovascular: Negative.  Gastrointestinal: Negative.  Genitourinary: Negative.  Musculoskeletal: Negative.  Neurological: Negative.  Endo/Heme/Allergies: Negative.  Cutaneous: Negative.  Past Medical History:  Diagnosis Date  . Asthma   . Benign enlargement of prostate   . Bladder disease   . Environmental allergies   . Hematuria   . 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 History of headache   . HIV exposure   . HTN (hypertension)   . Memory change 01/25/2013  . Restless legs syndrome (RLS) 01/14/2014    Family History  Problem Relation Age of Onset  . Lupus Mother   . Arthritis/Rheumatoid Mother   . Hypertension Father   . Heart disease Father   . Sarcoidosis Sister   . Cancer Maternal Grandmother   . Heart disease Maternal Grandfather   . Cancer - Prostate Paternal Grandfather   .  Allergic rhinitis Neg Hx   . Angioedema Neg Hx   . Asthma Neg Hx   . Eczema Neg Hx   . Immunodeficiency Neg Hx   . Urticaria Neg Hx     Social History   Social History  . Marital status: Single    Spouse name: N/A  . Number of children: 0  . Years of education: college   Occupational History  . 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 Smoking status: Former Smoker    Packs/day: 0.10    Years: 20.00    Types: Cigarettes    Quit date: 10/11/2012  . Smokeless tobacco: Never Used  . Alcohol use 0.0 oz/week     Comment: socially  . Drug use: No  . Sexual activity: Not on file   Other Topics Concern  . Not on file   Social History Narrative   Patient is left handed.   Patient drinks 3 glasses caffeine daily     I appreciate the opportunity to take part in [place] care. Please do not hesitate to contact me with  questions.  Sincerely,   R. Jorene Guest, MD

## 2017-05-19 NOTE — Assessment & Plan Note (Signed)
We will proceed with a therapeutic trial with proton pump inhibitor.  Appropriate reflux list modifications have been provided and discussed.  Restart ranitidine, 150 mg twice a day.  A prescription has been provided for omeprazole 40 mg daily, 30 minutes prior to breakfast.

## 2017-05-19 NOTE — Assessment & Plan Note (Signed)
Multifactorial, most likely underlying bronchial hyperresponsiveness triggered by reflux/silent reflux.  See treatment plan as outlined below.

## 2017-05-19 NOTE — Patient Instructions (Addendum)
Cough, persistent Multifactorial, most likely underlying bronchial hyperresponsiveness triggered by reflux/silent reflux.  See treatment plan as outlined below.  Gastroesophageal reflux disease without esophagitis We will proceed with a therapeutic trial with proton pump inhibitor.  Appropriate reflux list modifications have been provided and discussed.  Restart ranitidine, 150 mg twice a day.  A prescription has been provided for omeprazole 40 mg daily, 30 minutes prior to breakfast.  Mild persistent asthma  Flovent 220 g, one inhalation via spacer device twice a day.   During respiratory tract infections or asthma flares, increase Flovent 220g to 3 inhalations 2 times per day until symptoms have returned to baseline.  Continue albuterol HFA, 1-2 inhalations every 4-6 hours as needed.  Subjective and objective measures of pulmonary function will be followed and the treatment plan will be adjusted accordingly.  Seasonal and perennial allergic rhinitis  Continue appropriate allergen avoidance measures, that he is on nasal spray as needed, and nasal saline irrigation as needed.  For thick post nasal drainage, add guaifenesin [phone] mg (Mucinex)  twice daily as needed with adequate hydration as discussed.  If allergen avoidance measures and medications fail to adequately relieve symptoms, aeroallergen immunotherapy will be considered.   Return in about 4 months (around 09/19/2017), or if symptoms worsen or fail to improve.  Lifestyle Changes for Controlling GERD  When you have GERD, stomach acid feels as if it's backing up toward your mouth. Whether or not you take medication to control your GERD, your symptoms can often be improved with lifestyle changes.   Raise Your Head  Reflux is more likely to strike when you're lying down flat, because stomach fluid can  flow backward more easily. Raising the head of your bed 4-6 inches can help. To do this:  Slide blocks or books  under the legs at the head of your bed. Or, place a wedge under  the mattress. Many foam stores can make a suitable wedge for you. The wedge  should run from your waist to the top of your head.  Don't just prop your head on several pillows. This increases pressure on your  stomach. It can make GERD worse.  Watch Your Eating Habits Certain foods may increase the acid in your stomach or relax the lower esophageal sphincter, making GERD more likely. It's best to avoid the following:  Coffee, tea, and carbonated drinks (with and without caffeine)  Fatty, fried, or spicy food  Mint, chocolate, onions, and tomatoes  Any other foods that seem to irritate your stomach or cause you pain  Relieve the Pressure  Eat smaller meals, even if you have to eat more often.  Don't lie down right after you eat. Wait a few hours for your stomach to empty.  Avoid tight belts and tight-fitting clothes.  Lose excess weight.  Tobacco and Alcohol  Avoid smoking tobacco and drinking alcohol. They can make GERD symptoms worse.

## 2017-05-19 NOTE — Assessment & Plan Note (Signed)
   Continue appropriate allergen avoidance measures, that he is on nasal spray as needed, and nasal saline irrigation as needed.  For thick post nasal drainage, add guaifenesin [phone] mg (Mucinex)  twice daily as needed with adequate hydration as discussed.  If allergen avoidance measures and medications fail to adequately relieve symptoms, aeroallergen immunotherapy will be considered.

## 2017-05-19 NOTE — Assessment & Plan Note (Signed)
   Flovent 220 g, one inhalation via spacer device twice a day.   During respiratory tract infections or asthma flares, increase Flovent 220g to 3 inhalations 2 times per day until symptoms have returned to baseline.  Continue albuterol HFA, 1-2 inhalations every 4-6 hours as needed.  Subjective and objective measures of pulmonary function will be followed and the treatment plan will be adjusted accordingly.

## 2017-06-09 NOTE — Progress Notes (Signed)
[facility]  PATIENT: Travis Bryant DOB: 08/31/1960   REASON FOR VISIT: Follow-up for migraine, mild cognitive impairment HISTORY FROM: Patient    HISTORY OF PRESENT ILLNESS:[name] is a 56 year old right-handed black male with a history of headaches and a mild memory disturbance. He also has a history of closed head injury. The patient is  having occasional headache relieved with Tylenol and he is no longer taking Depakote . He reports ongoing issues with memory, he has not been good about taking his Aricept on a regular basis and he admits to not taking it on a regular basis now, but otherwise the memory issues have been stable.He last refilled his Aricept in March according to his pharmacy.  The patient has not had  GI side effects  when taking the medication. He returns for an evaluation. He is now on disability. He is driving without getting lost.   REVIEW OF SYSTEMS: Full 14 system review of systems performed and notable only for those listed, all others are neg:  Constitutional: neg  Cardiovascular: neg Ear/Nose/Throat: neg  Skin: neg Eyes: neg Respiratory: neg Gastroitestinal:  urinary frequency  Hematology/Lymphatic: neg  Endocrine: neg Musculoskeletal:neg Allergy/Immunology: neg Neurological:  MCI Psychiatric: neg Sleep : neg   ALLERGIES: No Known Allergies  HOME MEDICATIONS: Outpatient Medications Prior to Visit  Medication Sig Dispense Refill  . acetaminophen (TYLENOL) 325 MG tablet Take 650 mg by mouth every 6 (six) hours as needed (for pain).     Marland Kitchen. albuterol (PROVENTIL HFA;VENTOLIN HFA) 108 (90 Base) MCG/ACT inhaler Inhale 2 puffs into the lungs every 6 (six) hours as needed for wheezing or shortness of breath. 1 Inhaler 1  . amLODipine (NORVASC) 5 MG tablet Take 5 mg by mouth daily.  0  . Azelastine HCl 0.15 % SOLN 1-2 sprays per nostril 2 times daily 30 mL 5  . benzonatate (TESSALON) 100 MG capsule Take 1-2 capsules (100-200 mg total)  by mouth 3 (three) times daily as needed for cough. 60 capsule 0  . diclofenac (VOLTAREN) 75 MG EC tablet Take 75 mg by mouth 2 (two) times daily as needed for mild pain.    . fluticasone (FLONASE) 50 MCG/ACT nasal spray Place 2 sprays into both nostrils daily. (Patient taking differently: Place 2 sprays into both nostrils daily as needed. ) 16 g 5  . fluticasone (FLOVENT HFA) 220 MCG/ACT inhaler Inhale 3 puffs into the lungs daily as needed.    Marland Kitchen. levocetirizine (XYZAL) 5 MG tablet Take 1 tablet (5 mg total) by mouth every evening. 30 tablet 5  . omeprazole (PRILOSEC) 40 MG capsule Take 1 capsule (40 mg total) by mouth daily. 30 minutes prior to breakfast. 30 capsule 5  . ranitidine (ZANTAC) 150 MG tablet Take 1 tablet (150 mg total) by mouth 2 (two) times daily. 60 tablet 5  . tamsulosin (FLOMAX) 0.4 MG CAPS capsule take 2 tabs by mouth daily    . tadalafil (CIALIS) 5 MG tablet Take 5 mg by mouth.    Marland Kitchen. azithromycin (ZITHROMAX Z-PAK) 250 MG tablet 500 mg (2 tablets) on day 1 and 250 mg (1 tablet) on days 2 through 5. (Patient not taking: Reported on 05/19/2017) 6 each 0  . HYDROcodone-homatropine (HYCODAN) 5-1.5 MG/5ML syrup Take 5 mLs by mouth at bedtime as needed. (Patient not taking: Reported on 05/19/2017) 50 mL 0  . predniSONE (DELTASONE) 20 MG tablet Take 1 tablet (20 mg total) by mouth daily with breakfast. (Patient not taking: Reported on 05/19/2017)  5 tablet 0   No facility-administered medications prior to visit.     PAST MEDICAL HISTORY: Past Medical History:  Diagnosis Date  . Asthma   . Benign enlargement of prostate   . Bladder disease   . Environmental allergies   . Hematuria   . 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 History of headache   . HIV exposure   . HTN (hypertension)   . Memory change 01/25/2013  . Restless legs syndrome (RLS) 01/14/2014    PAST SURGICAL HISTORY: Past Surgical History:  Procedure Laterality Date  . ABDOMINAL SURGERY      . BRAIN SURGERY      FAMILY HISTORY: Family History  Problem Relation Age of Onset  . Lupus Mother   . Arthritis/Rheumatoid Mother   . Hypertension Father   . Heart disease Father   . Sarcoidosis Sister   . Cancer Maternal Grandmother   . Heart disease Maternal Grandfather   . Cancer - Prostate Paternal Grandfather   . Allergic rhinitis Neg Hx   . Angioedema Neg Hx   . Asthma Neg Hx   . Eczema Neg Hx   . Immunodeficiency Neg Hx   . Urticaria Neg Hx     SOCIAL HISTORY: Social History   Social History  . Marital status: Single    Spouse name: N/A  . Number of children: 0  . Years of education: college   Occupational History  . Teacher-part time [employer]   Social History Main Topics  . Smoking status: Former Smoker    Packs/day: 0.10    Years: 20.00    Types: Cigarettes    Quit date: 10/11/2012  . Smokeless tobacco: Never Used  . Alcohol use 0.0 oz/week     Comment: socially  . Drug use: No  . Sexual activity: Not on file   Other Topics Concern  . Not on file   Social History Narrative   Patient is left handed.   Patient drinks 3 glasses caffeine daily     PHYSICAL EXAM  Vitals:   06/10/17 1522  BP: 126/68  Pulse: 83  Weight: 157 lb 12.8 oz (71.6 kg)  Height: 5\' 10"  (1.778 m)   Body mass index is 22.64 kg/m.  Generalized: Well developed, in no acute distress  Head: normocephalic and atraumatic,. Oropharynx benign  Neck: Supple, no carotid bruits  Cardiac: Regular rate rhythm, no murmur  Musculoskeletal: Right arm and hand is smaller than the left  Neurological examination   Mentation: Alert  MMSE - Mini Mental State Exam 06/10/2017 11/06/2016 03/21/2016  Orientation to time 5 5 5   Orientation to Place 4 5 5   Registration 3 3 3   Attention/ Calculation 5 4 5   Recall 2 1 3   Language- name 2 objects 2 2 2   Language- repeat 1 1 1   Language- follow 3 step command 3 3 3   Language- read & follow direction 1 1 1   Write a sentence  1 1 1   Copy design 1 1 1   Total score 28 27 30     Follows all commands speech and language fluent. AFT 9. Clock drawing 4/4  Cranial nerve II-XII: .Pupils were equal round reactive to light extraocular movements were full, visual field were full on confrontational test. Facial sensation and strength were normal. hearing was intact to finger rubbing bilaterally. Uvula tongue midline. head turning and shoulder shrug were normal and symmetric.Tongue protrusion into cheek strength was normal. Motor: normal  bulk and tone, full strength in the BUE, BLE, fine finger movements normal, no pronator drift. No focal weakness Sensory: normal and symmetric to light touch, pinprick, and  Vibration, in the upper and lower extremities Coordination: finger-nose-finger, heel-to-shin bilaterally, no dysmetria Reflexes: Brachioradialis 2/2, biceps 2/2, triceps 2/2, patellar 2/2, Achilles 2/2, plantar responses were flexor bilaterally. Gait and Station: Mild circumduction gait with the right leg tandem gait unremarkable Romberg is negative  DIAGNOSTIC DATA (LABS, IMAGING, TESTING) - I reviewed patient records, labs, notes, testing and imaging myself where available.  Lab Results  Component Value Date   WBC 3.9 03/21/2016   HGB 13.4 03/21/2016   HCT 38.5 03/21/2016   MCV 89 03/21/2016   PLT 197 03/21/2016      Component Value Date/Time   NA 137 12/16/2016 1128   K 4.4 12/16/2016 1128   CL 104 12/16/2016 1128   CO2 27 12/16/2016 1128   GLUCOSE 100 (H) 12/16/2016 1128   GLUCOSE 116 (H) 12/09/2015 1258   BUN 16 12/16/2016 1128   CREATININE 1.28 (H) 12/16/2016 1128   CALCIUM 9.6 12/16/2016 1128   PROT 7.5 03/21/2016 1156   ALBUMIN 4.4 03/21/2016 1156   AST 19 03/21/2016 1156   ALT 13 03/21/2016 1156   ALKPHOS 62 03/21/2016 1156   BILITOT 0.4 03/21/2016 1156   GFRNONAA 62 12/16/2016 1128   GFRAA 72 12/16/2016 1128        ASSESSMENT AND PLAN  56 y.o. year old male  has a past medical history  of  1. Mild memory disturbance 2. History of closed head injury 3. History of migraine headache   The patient is doing better with the headaches. He is using Tylenol with relief. He feels his memory is stable. He has stopped his Aricept. He has had problems with compliance with Aricept in the past.Will restart the medication at 5 mg daily. Patient was made aware that mild cognitive impairment  Is seen with history of closed head injury ] the purpose of taking the medication.  Will follow up in 6 months I spent 25 min in total face to face time with the patient more than 50% of which was spent counseling and coordination of care, reviewing test results reviewing medications and discussing and reviewing the diagnosis of mild cognitive impairment and  Compliance with medication regimen. Nilda Riggs, [facility], [facility], APRN  [facility] [address] [place], [place] [address] [phone]

## 2017-06-10 ENCOUNTER — Encounter: Payer: Self-pay | Admitting: Nurse Practitioner

## 2017-06-10 ENCOUNTER — Ambulatory Visit (INDEPENDENT_AMBULATORY_CARE_PROVIDER_SITE_OTHER): Payer: Medicare PPO | Admitting: Nurse Practitioner

## 2017-06-10 VITALS — BP 126/68 | HR 83 | Ht 70.0 in | Wt 157.8 lb

## 2017-06-10 DIAGNOSIS — Z87828 Personal history of other (healed) physical injury and trauma: Secondary | ICD-10-CM | POA: Diagnosis not present

## 2017-06-10 DIAGNOSIS — G43709 Chronic migraine without aura, not intractable, without status migrainosus: Secondary | ICD-10-CM | POA: Diagnosis not present

## 2017-06-10 DIAGNOSIS — R413 Other amnesia: Secondary | ICD-10-CM | POA: Diagnosis not present

## 2017-06-10 MED ORDER — DONEPEZIL HCL 5 MG PO TABS: 5.0000 mg | ORAL_TABLET | Freq: Every day | ORAL | 6 refills | Status: DC

## 2017-06-10 NOTE — Progress Notes (Signed)
I have read the note, and I agree with the clinical assessment and plan.  WILLIS,CHARLES Tatum   

## 2017-06-10 NOTE — Patient Instructions (Signed)
The patient is doing better with the headaches. He is using Tylenol with relief. He feels his memory is stable. He has stopped his Aricept. He has had problems with compliance with Aricept in the past.Will restart the medication at 5 mg daily Will follow up in 6 months

## 2017-07-18 ENCOUNTER — Ambulatory Visit: Payer: Medicare PPO | Admitting: Cardiovascular Disease

## 2017-07-18 ENCOUNTER — Encounter: Payer: Self-pay | Admitting: Cardiovascular Disease

## 2017-07-18 VITALS — BP 122/70 | HR 93 | Ht 70.0 in | Wt 162.4 lb

## 2017-07-18 DIAGNOSIS — R0602 Shortness of breath: Secondary | ICD-10-CM | POA: Diagnosis not present

## 2017-07-18 NOTE — Progress Notes (Signed)
Chief Complaint  Patient presents with  . Follow-up    CHEST PAIN   History of Present Illness: 56 yo male with history of HTN, asthma, remote tobacco abuse, enlarged prostate here today for cardiac follow up. I saw him as a new patient 10/12/14 for evaluation of chest pain and SOB. He told me that three weeks prior to his first appointment with me he woke up with pain in his chest associated with SOB. This lasted for several minutes. He had no exertional chest pain. He denied any lower extremity edema, PND, orthopnea. I arranged an echo and an exercise stress test. The exercise stress test showed no ischemic EKG changes. Echo 10/17/14 with normal LV size and function, no significant valve disease. He was seen in the ED 12/06/15 for chest pain. He ruled out for MI. Chest pain relieved with a GI cocktail. He was seen in our office 12/14/15 by Carlean Jews, PA-C and his chest pain had resolved. He had recurrent dyspnea in May of 2018 after a flight from [place]. CTA May 2018 negative for PE. Echo May 2018 with normal LV systolic function  He is here today for follow up. The patient denies any chest pain, dyspnea, palpitations, lower extremity edema, orthopnea, PND, dizziness, near syncope or syncope.   Primary Care Physician: Jonny Ruiz, MD  Past Medical History:  Diagnosis Date  . Asthma   . Benign enlargement of prostate   . Bladder disease   . Environmental allergies   . Hematuria   . 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 History of headache   . HIV exposure   . HTN (hypertension)   . Memory change 01/25/2013  . Restless legs syndrome (RLS) 01/14/2014    Past Surgical History:  Procedure Laterality Date  . ABDOMINAL SURGERY    . BRAIN SURGERY      Current Outpatient Medications  Medication Sig Dispense Refill  . acetaminophen (TYLENOL) 325 MG tablet Take 650 mg by mouth every 6 (six) hours as needed (for pain).     Marland Kitchen albuterol (PROVENTIL HFA;VENTOLIN  HFA) 108 (90 Base) MCG/ACT inhaler Inhale 2 puffs into the lungs every 6 (six) hours as needed for wheezing or shortness of breath. 1 Inhaler 1  . amLODipine (NORVASC) 5 MG tablet Take 5 mg by mouth daily.  0  . Azelastine HCl 0.15 % SOLN 1-2 sprays per nostril 2 times daily 30 mL 5  . benzonatate (TESSALON) 100 MG capsule Take 1-2 capsules (100-200 mg total) by mouth 3 (three) times daily as needed for cough. 60 capsule 0  . diclofenac (VOLTAREN) 75 MG EC tablet Take 75 mg by mouth 2 (two) times daily as needed for mild pain.    Marland Kitchen donepezil (ARICEPT) 5 MG tablet Take 1 tablet (5 mg total) by mouth at bedtime. 30 tablet 6  . fluticasone (FLOVENT HFA) 220 MCG/ACT inhaler Inhale 3 puffs into the lungs daily as needed.    Marland Kitchen levocetirizine (XYZAL) 5 MG tablet Take 1 tablet (5 mg total) by mouth every evening. 30 tablet 5  . omeprazole (PRILOSEC) 40 MG capsule Take 1 capsule (40 mg total) by mouth daily. 30 minutes prior to breakfast. 30 capsule 5  . ranitidine (ZANTAC) 150 MG tablet Take 1 tablet (150 mg total) by mouth 2 (two) times daily. 60 tablet 5  . tamsulosin (FLOMAX) 0.4 MG CAPS capsule take 2 tabs by mouth daily    . fluticasone (FLONASE) 50  MCG/ACT nasal spray Place 2 sprays into both nostrils daily. (Patient taking differently: Place 2 sprays into both nostrils daily as needed. ) 16 g 5  . tadalafil (CIALIS) 5 MG tablet Take 5 mg by mouth.     No current facility-administered medications for this visit.     No Known Allergies  Social History   Socioeconomic History  . Marital status: Single    Spouse name: Not on file  . Number of children: 0  . Years of education: college  . Highest education level: Not on file  Social Needs  . Financial resource strain: Not on file  . Food insecurity - worry: Not on file  . Food insecurity - inability: Not on file  . Transportation needs - medical: Not on file  . Transportation needs - non-medical: Not on file  Occupational History  .  Occupation: Teacher-part time    Employer: [employer]  Tobacco Use  . Smoking status: Former Smoker    Packs/day: 0.10    Years: 20.00    Pack years: 2.00    Types: Cigarettes    Last attempt to quit: 10/11/2012    Years since quitting: 4.7  . Smokeless tobacco: Never Used  Substance and Sexual Activity  . Alcohol use: Yes    Alcohol/week: 0.0 oz    Comment: socially  . Drug use: No  . Sexual activity: Not on file  Other Topics Concern  . Not on file  Social History Narrative   Patient is left handed.   Patient drinks 3 glasses caffeine daily    Family History  Problem Relation Age of Onset  . Lupus Mother   . Arthritis/Rheumatoid Mother   . Hypertension Father   . Heart disease Father   . Sarcoidosis Sister   . Cancer Maternal Grandmother   . Heart disease Maternal Grandfather   . Cancer - Prostate Paternal Grandfather   . Allergic rhinitis Neg Hx   . Angioedema Neg Hx   . Asthma Neg Hx   . Eczema Neg Hx   . Immunodeficiency Neg Hx   . Urticaria Neg Hx     Review of Systems:  As stated in the HPI and otherwise negative.   BP 122/70   Pulse 93   Ht 5\' 10"  (1.778 m)   Wt 162 lb 6.4 oz (73.7 kg)   SpO2 95%   BMI 23.30 kg/m   Physical Examination:  General: Well developed, well nourished, NAD  HEENT: OP clear, mucus membranes moist  SKIN: warm, dry. No rashes. Neuro: No focal deficits  Musculoskeletal: Muscle strength 5/5 all ext  Psychiatric: Mood and affect normal  Neck: No JVD, no carotid bruits, no thyromegaly, no lymphadenopathy.  Lungs:Clear bilaterally, no wheezes, rhonci, crackles Cardiovascular: Regular rate and rhythm. No murmurs, gallops or rubs. Abdomen:Soft. Bowel sounds present. Non-tender.  Extremities: No lower extremity edema. Pulses are 2 + in the bilateral DP/PT.  Echo May 2018: Left ventricle: The cavity size was normal. Systolic function was normal. The estimated ejection fraction was in the range of 55% to 60%.  Wall motion was normal; there were no regional wall motion abnormalities. Doppler parameters are consistent with abnormal left ventricular relaxation (grade 1 diastolic dysfunction). There was no evidence of elevated ventricular filling pressure by Doppler parameters. - Aortic valve: Trileaflet; normal thickness leaflets. There was trivial regurgitation. - Aortic root: The aortic root was normal in size. - Mitral valve: Structurally normal valve. There was mild regurgitation. - Left atrium: The  atrium was normal in size. - Right ventricle: The cavity size was normal. Wall thickness was normal. Systolic function was normal. - Right atrium: The atrium was normal in size. - Tricuspid valve: There was mild regurgitation. - Pulmonary arteries: Systolic pressure was within the normal range. - Inferior vena cava: The vessel was normal in size. - Pericardium, extracardiac: There was no pericardial effusion.  CTA 5/7/18IMPRESSION: 1. No acute findings. No evidence for acute pulmonary embolus.  EKG:  EKG is not   ordered today. The ekg ordered today demonstrates   Recent Labs: 12/16/2016: BUN 16; Creatinine, Ser 1.28; Potassium 4.4; Sodium 137   Lipid Panel No results found for: CHOL, TRIG, HDL, CHOLHDL, VLDL, LDLCALC, LDLDIRECT   Wt Readings from Last 3 Encounters:  07/18/17 162 lb 6.4 oz (73.7 kg)  06/10/17 157 lb 12.8 oz (71.6 kg)  05/13/17 151 lb 1.9 oz (68.5 kg)     Other studies Reviewed: Additional studies/ records that were reviewed today include: . Review of the above records demonstrates:    Assessment and Plan:   1. Chest pain/Dyspnea: His chest pain and dyspnea are not felt to be cardiac. He has had normal stress testing and is known to have normal LV systolic function.    2. 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   I will see him back as needed.   Current medicines are reviewed at length with the patient today.  The patient does not  have concerns regarding medicines.  The following changes have been made:  no change  Labs/ tests ordered today include:  No orders of the defined types were placed in this encounter.  Disposition:   FU with me as needed.   Signed, Verne Carrowhristopher Kevion Fatheree, MD 07/18/2017 11:03 AM    [facility] [address], [place], [place]  [address] Phone: [phone]; Fax: [phone]

## 2017-07-18 NOTE — Patient Instructions (Signed)
Medication Instructions:  Your physician recommends that you continue on your current medications as directed. Please refer to the Current Medication list given to you today.   Labwork: none  Testing/Procedures: none  Follow-Up: Your physician recommends that you schedule a follow-up appointment as needed with Dr. McAlhany    Any Other Special Instructions Will Be Listed Below (If Applicable).     If you need a refill on your cardiac medications before your next appointment, please call your pharmacy.   

## 2017-12-11 ENCOUNTER — Ambulatory Visit: Payer: Medicare PPO | Admitting: Nurse Practitioner

## 2017-12-30 NOTE — Progress Notes (Signed)
[facility]  PATIENT: Travis Bryant DOB: 05-04-1961   REASON FOR VISIT: Follow-up for migraine, mild cognitive impairment HISTORY FROM: Patient    HISTORY OF PRESENT ILLNESS:Travis Bryant is a 57 year old right-handed black male with a history of headaches and a mild memory disturbance. He also has a history of closed head injury. The patient is  having rare  headache relieved with Tylenol . He reports ongoing issues with memory, he has not been good about taking his Aricept on a regular basis and he admits to not taking it on a regular basis now, but otherwise the memory issues have been stable. The patient has not had  GI side effects  when taking the medication. He returns for an evaluation. He is now on disability. He is driving without getting lost.   REVIEW OF SYSTEMS: Full 14 system review of systems performed and notable only for those listed, all others are neg:  Constitutional: neg  Cardiovascular: neg Ear/Nose/Throat: neg  Skin: neg Eyes: neg Respiratory: neg Gastroitestinal:  urinary frequency  Hematology/Lymphatic: neg  Endocrine: neg Musculoskeletal:neg Allergy/Immunology: neg Neurological:  MCI Psychiatric: neg Sleep : neg   ALLERGIES: No Known Allergies  HOME MEDICATIONS: Outpatient Medications Prior to Visit  Medication Sig Dispense Refill  . acetaminophen (TYLENOL) 325 MG tablet Take 650 mg by mouth every 6 (six) hours as needed (for pain).     Marland Kitchen albuterol (PROVENTIL HFA;VENTOLIN HFA) 108 (90 Base) MCG/ACT inhaler Inhale 2 puffs into the lungs every 6 (six) hours as needed for wheezing or shortness of breath. 1 Inhaler 1  . amLODipine (NORVASC) 5 MG tablet Take 5 mg by mouth daily.  0  . Azelastine HCl 0.15 % SOLN 1-2 sprays per nostril 2 times daily 30 mL 5  . benzonatate (TESSALON) 100 MG capsule Take 1-2 capsules (100-200 mg total) by mouth 3 (three) times daily as needed for cough. 60 capsule 0  . diclofenac (VOLTAREN) 75 MG EC tablet  Take 75 mg by mouth 2 (two) times daily as needed for mild pain.    . fluticasone (FLOVENT HFA) 220 MCG/ACT inhaler Inhale 3 puffs into the lungs daily as needed.    Marland Kitchen levocetirizine (XYZAL) 5 MG tablet Take 1 tablet (5 mg total) by mouth every evening. 30 tablet 5  . omeprazole (PRILOSEC) 40 MG capsule Take 1 capsule (40 mg total) by mouth daily. 30 minutes prior to breakfast. 30 capsule 5  . ranitidine (ZANTAC) 150 MG tablet Take 1 tablet (150 mg total) by mouth 2 (two) times daily. 60 tablet 5  . tamsulosin (FLOMAX) 0.4 MG CAPS capsule take 2 tabs by mouth daily    . donepezil (ARICEPT) 5 MG tablet Take 1 tablet (5 mg total) by mouth at bedtime. (Patient not taking: Reported on 12/31/2017) 30 tablet 6  . fluticasone (FLONASE) 50 MCG/ACT nasal spray Place 2 sprays into both nostrils daily. (Patient taking differently: Place 2 sprays into both nostrils daily as needed. ) 16 g 5  . tadalafil (CIALIS) 5 MG tablet Take 5 mg by mouth.     No facility-administered medications prior to visit.     PAST MEDICAL HISTORY: Past Medical History:  Diagnosis Date  . Asthma   . Benign enlargement of prostate   . Bladder disease   . Environmental allergies   . Hematuria   . 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 History of headache   . HIV exposure   . HTN (  hypertension)   . Memory change 01/25/2013  . Restless legs syndrome (RLS) 01/14/2014    PAST SURGICAL HISTORY: Past Surgical History:  Procedure Laterality Date  . ABDOMINAL SURGERY    . BRAIN SURGERY      FAMILY HISTORY: Family History  Problem Relation Age of Onset  . Lupus Mother   . Arthritis/Rheumatoid Mother   . Hypertension Father   . Heart disease Father   . Sarcoidosis Sister   . Cancer Maternal Grandmother   . Heart disease Maternal Grandfather   . Cancer - Prostate Paternal Grandfather   . Allergic rhinitis Neg Hx   . Angioedema Neg Hx   . Asthma Neg Hx   . Eczema Neg Hx   . Immunodeficiency  Neg Hx   . Urticaria Neg Hx     SOCIAL HISTORY: Social History   Socioeconomic History  . Marital status: Single    Spouse name: Not on file  . Number of children: 0  . Years of education: college  . Highest education level: Not on file  Occupational History  . Occupation: Teacher-part time    Employer: [employer]  Social Needs  . Financial resource strain: Not on file  . Food insecurity:    Worry: Not on file    Inability: Not on file  . Transportation needs:    Medical: Not on file    Non-medical: Not on file  Tobacco Use  . Smoking status: Former Smoker    Packs/day: 0.10    Years: 20.00    Pack years: 2.00    Types: Cigarettes    Last attempt to quit: 10/11/2012    Years since quitting: 5.2  . Smokeless tobacco: Never Used  Substance and Sexual Activity  . Alcohol use: Yes    Alcohol/week: 0.0 oz    Comment: socially  . Drug use: No  . Sexual activity: Not on file  Lifestyle  . Physical activity:    Days per week: Not on file    Minutes per session: Not on file  . Stress: Not on file  Relationships  . Social connections:    Talks on phone: Not on file    Gets together: Not on file    Attends religious service: Not on file    Active member of club or organization: Not on file    Attends meetings of clubs or organizations: Not on file    Relationship status: Not on file  . Intimate partner violence:    Fear of current or ex partner: Not on file    Emotionally abused: Not on file    Physically abused: Not on file    Forced sexual activity: Not on file  Other Topics Concern  . Not on file  Social History Narrative   Patient is left handed.   Patient drinks 3 glasses caffeine daily   Two Masters degrees     PHYSICAL EXAM  Vitals:   12/31/17 1410  BP: 120/80  Pulse: 91  Weight: 158 lb 3.2 oz (71.8 kg)  Height:  (1.778 m)   Body mass index is 22.7 kg/m.  Generalized: Well developed, in no acute distress  Head: normocephalic  and atraumatic,. Oropharynx benign  Neck: Supple,  Musculoskeletal: Right arm and hand is smaller than the left  Neurological examination   Mentation: Alert  MMSE - Mini Mental State Exam 12/31/2017 06/10/2017 11/06/2016  Orientation to time Orientation to Place Registration 3 3  3  Attention/ Calculation Recall Language- name 2 objects Language- repeat Language- follow 3 step command Language- read & follow direction Write a sentence Copy design Total score Follows all commands speech and language fluent. AFT 16. Clock drawing 4/4  Cranial nerve II-XII: .Pupils were equal round reactive to light extraocular movements were full, visual field were full on confrontational test. Facial sensation and strength were normal. hearing was intact to finger rubbing bilaterally. Uvula tongue midline. head turning and shoulder shrug were normal and symmetric.Tongue protrusion into cheek strength was normal. Motor: normal bulk and tone, full strength in the BUE, BLE, Sensory: normal and symmetric to light touch,  Coordination: finger-nose-finger, heel-to-shin bilaterally, no dysmetria Reflexes: Brachioradialis 2/2, biceps 2/2, triceps 2/2, patellar 2/2, Achilles 2/2, plantar responses were flexor bilaterally. Gait and Station: Mild circumduction gait with the right leg, tandem gait unremarkable Romberg is negative  DIAGNOSTIC DATA (LABS, IMAGING, TESTING) - I reviewed patient records, labs, notes, testing and imaging myself where available.  Lab Results  Component Value Date   WBC 3.9 03/21/2016   HGB 13.4 03/21/2016   HCT 38.5 03/21/2016   MCV 89 03/21/2016   PLT 197 03/21/2016      Component Value Date/Time   NA 137 12/16/2016 1128   K 4.4 12/16/2016 1128   CL 104 12/16/2016 1128   CO2 27 12/16/2016 1128   GLUCOSE 100 (H) 12/16/2016 1128   GLUCOSE 116 (H) 12/09/2015 1258   BUN 16 12/16/2016 1128    CREATININE 1.28 (H) 12/16/2016 1128   CALCIUM 9.6 12/16/2016 1128   PROT 7.5 03/21/2016 1156   ALBUMIN 4.4 03/21/2016 1156   AST 19 03/21/2016 1156   ALT 13 03/21/2016 1156   ALKPHOS 62 03/21/2016 1156   BILITOT 0.4 03/21/2016 1156   GFRNONAA 62 12/16/2016 1128   GFRAA 72 12/16/2016 1128        ASSESSMENT AND PLAN  57 y.o. year old male  has a past medical history of  1. Mild memory disturbance 2. History of closed head injury 3. History of migraine headache   The patient is doing better with the headaches.  He feels his memory is stable. He has stopped his Aricept. He has had problems with compliance with Aricept in the past.Will restart the medication at 5 mg daily.  He has not had side effects to the medication.  Patient was made aware that mild cognitive impairment  Is seen with history of closed head injury and  the purpose of taking the medication.  Will follow up in 8 months I spent 25 min in total face to face time with the patient more than 50% of which was spent counseling and coordination of care, reviewing test results reviewing medications and discussing and reviewing the diagnosis of mild cognitive impairment and  Compliance with medication regimen. Nilda Riggs, [facility], [facility], APRN  [facility] [address] [place], [place] [address] [phone]

## 2017-12-31 ENCOUNTER — Ambulatory Visit: Payer: Medicare PPO | Admitting: Nurse Practitioner

## 2017-12-31 ENCOUNTER — Encounter: Payer: Self-pay | Admitting: Nurse Practitioner

## 2017-12-31 VITALS — BP 120/80 | HR 91 | Ht 70.0 in | Wt 158.2 lb

## 2017-12-31 DIAGNOSIS — R413 Other amnesia: Secondary | ICD-10-CM | POA: Diagnosis not present

## 2017-12-31 DIAGNOSIS — S0990XS Unspecified injury of head, sequela: Secondary | ICD-10-CM | POA: Diagnosis not present

## 2017-12-31 MED ORDER — DONEPEZIL HCL 5 MG PO TABS: 5.0000 mg | ORAL_TABLET | Freq: Every day | ORAL | 2 refills | Status: DC

## 2017-12-31 NOTE — Patient Instructions (Addendum)
The patient is doing better with the headaches.  He feels his memory is stable. He has stopped his Aricept. He has had problems with compliance with Aricept in the past.Will restart the medication at 5 mg daily. Patient was made aware that mild cognitive impairment  Is seen with history of closed head injury ] the purpose of taking the medication.  Will follow up in 8 months

## 2017-12-31 NOTE — Progress Notes (Signed)
I have read the note, and I agree with the clinical assessment and plan.  Salimah Martinovich K Easten Maceachern   

## 2018-03-25 ENCOUNTER — Ambulatory Visit (HOSPITAL_COMMUNITY)
Admission: EM | Admit: 2018-03-25 | Discharge: 2018-03-25 | Disposition: A | Discharge status: Home / Self Care | Payer: Medicare PPO | Attending: Family Medicine | Admitting: Family Medicine

## 2018-03-25 ENCOUNTER — Encounter (HOSPITAL_COMMUNITY): Payer: Self-pay

## 2018-03-25 ENCOUNTER — Telehealth: Payer: Self-pay | Admitting: Cardiovascular Disease

## 2018-03-25 DIAGNOSIS — J301 Allergic rhinitis due to pollen: Secondary | ICD-10-CM

## 2018-03-25 DIAGNOSIS — R202 Paresthesia of skin: Secondary | ICD-10-CM | POA: Diagnosis not present

## 2018-03-25 MED ORDER — IPRATROPIUM BROMIDE 0.03 % NA SOLN: 2.0000 | Freq: Two times a day (BID) | NASAL | 0 refills | Status: DC

## 2018-03-25 NOTE — ED Provider Notes (Signed)
[facility]   [id number] 03/25/18 Arrival Time: 1830   SUBJECTIVE:  Travis Bryant is a 57 y.o. male who presents to the urgent care with complaint of blood pressure has been up. Pt has a numbness in his arms and legs..  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  His niece is a Teacher, early years/prepharmacist.  For the last week or so patient has had increased nasal stuffiness.  He takes Flonase intermittently.  Patient said no chest pain or shortness of breath, no extremity pain, headaches, neck pain.  He is a little bit concerned about his symptoms because his father died at age 58 of a heart condition.     Past Medical History:  Diagnosis Date  . Asthma   . Benign enlargement of prostate   . Bladder disease   . Environmental allergies   . Hematuria   . 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 History of headache   . HIV exposure   . HTN (hypertension)   . Memory change 01/25/2013  . Restless legs syndrome (RLS) 01/14/2014   Family History  Problem Relation Age of Onset  . Lupus Mother   . Arthritis/Rheumatoid Mother   . Hypertension Father   . Heart disease Father   . Sarcoidosis Sister   . Cancer Maternal Grandmother   . Heart disease Maternal Grandfather   . Cancer - Prostate Paternal Grandfather   . Allergic rhinitis Neg Hx   . Angioedema Neg Hx   . Asthma Neg Hx   . Eczema Neg Hx   . Immunodeficiency Neg Hx   . Urticaria Neg Hx    Social History   Socioeconomic History  . Marital status: Single    Spouse name: Not on file  . Number of children: 0  . Years of education: college  . Highest education level: Not on file  Occupational History  . Occupation: Teacher-part time    Employer: [employer]  Social Needs  . Financial resource strain: Not on file  . Food insecurity:    Worry: Not on file    Inability: Not on file  . Transportation needs:    Medical: Not on file    Non-medical: Not on file    Tobacco Use  . Smoking status: Former Smoker    Packs/day: 0.10    Years: 20.00    Pack years: 2.00    Types: Cigarettes    Last attempt to quit: 10/11/2012    Years since quitting: 5.4  . Smokeless tobacco: Never Used  Substance and Sexual Activity  . Alcohol use: Yes    Alcohol/week: 0.0 standard drinks    Comment: socially  . Drug use: No  . Sexual activity: Not on file  Lifestyle  . Physical activity:    Days per week: Not on file    Minutes per session: Not on file  . Stress: Not on file  Relationships  . Social connections:    Talks on phone: Not on file    Gets together: Not on file    Attends religious service: Not on file    Active member of club or organization: Not on file    Attends meetings of clubs or organizations: Not on file    Relationship status: Not on file  . Intimate partner violence:    Fear of current or ex partner: Not on file    Emotionally abused: Not on file  Physically abused: Not on file    Forced sexual activity: Not on file  Other Topics Concern  . Not on file  Social History Narrative   Patient is left handed.   Patient drinks 3 glasses caffeine daily   Two Masters degrees   No outpatient medications have been marked as taking for the 03/25/18 encounter [facility](Hospital Encounter).   No Known Allergies    ROS: As per HPI, remainder of ROS negative.   OBJECTIVE:   Vitals:   03/25/18 1854 03/25/18 1857 03/25/18 1904  BP: (!) 145/91  135/85  Pulse: (!) 16  71  Resp: 18  18  Temp: 97.9 F (36.6 C)    TempSrc: Oral    SpO2: 100%  100%  Weight:  71.2 kg      General appearance: alert; no distress Eyes: PERRL; EOMI; conjunctiva normal HENT: normocephalic; atraumatic; TMs normal, canal normal, external ears normal without trauma; nasal mucosa normal; oral mucosa normal Neck: supple Lungs: clear to auscultation bilaterally Heart: regular rate and rhythm Back: no CVA tenderness Extremities: no cyanosis or edema; symmetrical with  no gross deformities Skin: warm and dry Neurologic: normal gait; grossly normal Psychological: alert and cooperative; normal mood and affect      Labs:  Results for orders placed or performed in visit on 12/16/16  Basic metabolic panel  Result Value Ref Range   Glucose 100 (H) 65 - 99 mg/dL   BUN 16 6 - 24 mg/dL   Creatinine, Ser 4.091.28 (H) 0.76 - 1.27 mg/dL   GFR calc non Af Amer 62 >59 mL/min/1.73   GFR calc Af Amer 72 >59 mL/min/1.73   BUN/Creatinine Ratio 13 9 - 20   Sodium 137 134 - 144 mmol/L   Potassium 4.4 3.5 - 5.2 mmol/L   Chloride 104 96 - 106 mmol/L   CO2 27 18 - 29 mmol/L   Calcium 9.6 8.7 - 10.2 mg/dL    Labs Reviewed - No data to display  No results found.     ASSESSMENT & PLAN:  1. Paresthesia   2. Seasonal allergic rhinitis due to pollen   Also suggested melatonin to help with sleep. Meds ordered this encounter  Medications  . ipratropium (ATROVENT) 0.03 % nasal spray    Sig: Place 2 sprays into both nostrils 2 (two) times daily.    Dispense:  30 mL    Refill:  0    Reviewed expectations re: course of current medical issues. Questions answered. Outlined signs and symptoms indicating need for more acute intervention. Patient verbalized understanding. After Visit Summary given.    Procedures:      Elvina SidleLauenstein, Amarionna Arca, MD 03/25/18 1924

## 2018-03-25 NOTE — Discharge Instructions (Addendum)
This is most consistent with allergies.  With the nose blocked up, you breathe differently and often this leads to the numbness.  Stay with the Flonase and add the Atrovent at night.

## 2018-03-25 NOTE — ED Triage Notes (Signed)
C/o blood pressure has been up. Pt has a numbness in his arms and legs..Marland Kitchen

## 2018-03-25 NOTE — Telephone Encounter (Signed)
New problem   Pt is stating for the last 3 days he has a numbness in both legs and going to arms. Pt stated it was painful last night. Pt wish call back from nurse. Please advise pt

## 2018-03-25 NOTE — Telephone Encounter (Signed)
I spoke with pt regarding symptoms below. Pt reports "deep numbness" in arms and legs that feels "like it is going through his blood stream." Started 3 days ago. Describes as achy feeling. Both legs and arms.  Chest feels "exhausted" at times. No chest pain. Not sleeping well recently.   Improves a little when he gets up and moves around.  Some improvement after taking tylenol yesterday. No speech problems. No facial numbness or drooping.  I asked pt to contact primary care to have these symptoms evaluated. Pt agreeable with this plan.

## 2018-03-26 NOTE — Telephone Encounter (Signed)
Thanks. Agree. Travis Bryant

## 2018-05-21 ENCOUNTER — Other Ambulatory Visit: Payer: Self-pay

## 2018-05-21 MED ORDER — AMLODIPINE BESYLATE 5 MG PO TABS: 5.0000 mg | ORAL_TABLET | Freq: Every day | ORAL | 3 refills | Status: DC

## 2018-05-21 NOTE — Telephone Encounter (Signed)
Pt walked in to get refill for AMLODIPINE 5 MG QD because PCP no longer accepts his insurance. Pat agreed to give him a four month supply until he can establish care with a new PCP.

## 2018-09-02 NOTE — Progress Notes (Signed)
[facility]  PATIENT: Travis Bryant DOB: 1960/11/05   REASON FOR VISIT: Follow-up for migraine, mild cognitive impairment HISTORY FROM: Patient    HISTORY OF PRESENT ILLNESS: Update 1/23/2020CM Travis Bryant, 58 year old male returns for follow-up with history of headaches and mild memory disturbance.  He has a history of closed head injury in the past.  His headaches are much improved and he is only taking Tylenol.  His memory score is stable at 29 out of 30 he has not been taking his Aricept.  He did not have GI side effects when taking the medication however he does not want to take it he is on disability.  He claims he drives without getting lost he says his memory issues have been stable.  He returns for reevaluation   5/22/19CM Travis Bryant is a 58 year old right-handed black male with a history of headaches and a mild memory disturbance. He also has a history of closed head injury. The patient is  having rare  headache relieved with Tylenol . He reports ongoing issues with memory, he has not been good about taking his Aricept on a regular basis and he admits to not taking it on a regular basis now, but otherwise the memory issues have been stable. The patient has not had  GI side effects  when taking the medication. He returns for an evaluation. He is now on disability. He is driving without getting lost.   REVIEW OF SYSTEMS: Full 14 system review of systems performed and notable only for those listed, all others are neg:  Constitutional: neg  Cardiovascular: neg Ear/Nose/Throat: neg  Skin: neg Eyes: neg Respiratory: neg Gastroitestinal:  urinary frequency  Hematology/Lymphatic: neg  Endocrine: neg Musculoskeletal:neg Allergy/Immunology: neg Neurological:  MCI, headaches Psychiatric: neg Sleep : neg   ALLERGIES: No Known Allergies  HOME MEDICATIONS: Outpatient Medications Prior to Visit  Medication Sig Dispense Refill  . acetaminophen (TYLENOL) 325 MG  tablet Take 650 mg by mouth every 6 (six) hours as needed (for pain).     Marland Kitchen. albuterol (PROVENTIL HFA;VENTOLIN HFA) 108 (90 Base) MCG/ACT inhaler Inhale 2 puffs into the lungs every 6 (six) hours as needed for wheezing or shortness of breath. 1 Inhaler 1  . amLODipine (NORVASC) 5 MG tablet Take 1 tablet (5 mg total) by mouth daily. 30 tablet 3  . fluticasone (FLOVENT HFA) 220 MCG/ACT inhaler Inhale 3 puffs into the lungs daily as needed.    Marland Kitchen. ipratropium (ATROVENT) 0.03 % nasal spray Place 2 sprays into both nostrils 2 (two) times daily. 30 mL 0  . levocetirizine (XYZAL) 5 MG tablet Take 1 tablet (5 mg total) by mouth every evening. 30 tablet 5  . tamsulosin (FLOMAX) 0.4 MG CAPS capsule take 2 tabs by mouth daily    . tadalafil (CIALIS) 5 MG tablet Take 5 mg by mouth.     No facility-administered medications prior to visit.     PAST MEDICAL HISTORY: Past Medical History:  Diagnosis Date  . Asthma   . Benign enlargement of prostate   . Bladder disease   . Environmental allergies   . Hematuria   . 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 History of headache   . HIV exposure   . HTN (hypertension)   . Memory change 01/25/2013  . Restless legs syndrome (RLS) 01/14/2014    PAST SURGICAL HISTORY: Past Surgical History:  Procedure Laterality Date  . ABDOMINAL SURGERY    . BRAIN  SURGERY      FAMILY HISTORY: Family History  Problem Relation Age of Onset  . Lupus Mother   . Arthritis/Rheumatoid Mother   . Hypertension Father   . Heart disease Father   . Sarcoidosis Sister   . Cancer Maternal Grandmother   . Heart disease Maternal Grandfather   . Cancer - Prostate Paternal Grandfather   . Allergic rhinitis Neg Hx   . Angioedema Neg Hx   . Asthma Neg Hx   . Eczema Neg Hx   . Immunodeficiency Neg Hx   . Urticaria Neg Hx     SOCIAL HISTORY: Social History   Socioeconomic History  . Marital status: Single    Spouse name: Not on file  . Number of  children: 0  . Years of education: college  . Highest education level: Not on file  Occupational History  . Occupation: Teacher-part time    Employer: [employer]  Social Needs  . Financial resource strain: Not on file  . Food insecurity:    Worry: Not on file    Inability: Not on file  . Transportation needs:    Medical: Not on file    Non-medical: Not on file  Tobacco Use  . Smoking status: Former Smoker    Packs/day: 0.10    Years: 20.00    Pack years: 2.00    Types: Cigarettes    Last attempt to quit: 10/11/2012    Years since quitting: 5.8  . Smokeless tobacco: Never Used  Substance and Sexual Activity  . Alcohol use: Yes    Alcohol/week: 0.0 standard drinks    Comment: socially  . Drug use: No  . Sexual activity: Not on file  Lifestyle  . Physical activity:    Days per week: Not on file    Minutes per session: Not on file  . Stress: Not on file  Relationships  . Social connections:    Talks on phone: Not on file    Gets together: Not on file    Attends religious service: Not on file    Active member of club or organization: Not on file    Attends meetings of clubs or organizations: Not on file    Relationship status: Not on file  . Intimate partner violence:    Fear of current or ex partner: Not on file    Emotionally abused: Not on file    Physically abused: Not on file    Forced sexual activity: Not on file  Other Topics Concern  . Not on file  Social History Narrative   Patient is left handed.   Patient drinks 3 glasses caffeine daily   Two Masters degrees     PHYSICAL EXAM  Vitals:   09/03/18 1437  BP: 121/79  Pulse: (!) 110  Weight: 158 lb (71.7 kg)  Height: 5' 9.5" (1.765 m)   Body mass index is 23 kg/m.  Generalized: Well developed, in no acute distress  Head: normocephalic and atraumatic,. Oropharynx benign  Neck: Supple,  Musculoskeletal: Right arm and hand is smaller than the left  Neurological examination    Mentation: Alert  MMSE - Mini Mental State Exam 09/03/2018 12/31/2017 06/10/2017  Orientation to time 5 4 5   Orientation to Place 5 4 4   Registration 3 3 3   Attention/ Calculation 5 4 5   Recall 2 2 2   Language- name 2 objects 2 2 2   Language- repeat 1 1 1   Language- follow 3 step command 3 3 3  Language- read & follow direction 1 1 1   Write a sentence 1 1 1   Copy design 1 1 1   Total score 29 26 28     Follows all commands speech and language fluent. AFT 18. Clock drawing 4/4  Cranial nerve II-XII: .Pupils were equal round reactive to light extraocular movements were full, visual field were full on confrontational test. Facial sensation and strength were normal. hearing was intact to finger rubbing bilaterally. Uvula tongue midline. head turning and shoulder shrug were normal and symmetric.Tongue protrusion into cheek strength was normal. Motor: normal bulk and tone, full strength in the BUE, BLE, Sensory: normal and symmetric to light touch,  Coordination: finger-nose-finger, heel-to-shin bilaterally, no dysmetria Reflexes: Symmetric upper and lower plantar responses were flexor bilaterally. Gait and Station: Mild circumduction gait with the right leg, tandem gait unremarkable Romberg is negative  DIAGNOSTIC DATA (LABS, IMAGING, TESTING) - I reviewed patient records, labs, notes, testing and imaging myself where available.  Lab Results  Component Value Date   WBC 3.9 03/21/2016   HGB 13.4 03/21/2016   HCT 38.5 03/21/2016   MCV 89 03/21/2016   PLT 197 03/21/2016      Component Value Date/Time   NA 137 12/16/2016 1128   K 4.4 12/16/2016 1128   CL 104 12/16/2016 1128   CO2 27 12/16/2016 1128   GLUCOSE 100 (H) 12/16/2016 1128   GLUCOSE 116 (H) 12/09/2015 1258   BUN 16 12/16/2016 1128   CREATININE 1.28 (H) 12/16/2016 1128   CALCIUM 9.6 12/16/2016 1128   PROT 7.5 03/21/2016 1156   ALBUMIN 4.4 03/21/2016 1156   AST 19 03/21/2016 1156   ALT 13 03/21/2016 1156   ALKPHOS 62  03/21/2016 1156   BILITOT 0.4 03/21/2016 1156   GFRNONAA 62 12/16/2016 1128   GFRAA 72 12/16/2016 1128        ASSESSMENT AND PLAN  58 y.o. year old male  has a past medical history of  1. Mild memory disturbance 2. History of closed head injury 3. History of migraine headache   The patient is doing better with the headaches.  He takes Tylenol with relief.   He feels his memory is stable. 29/30.  He has stopped his Aricept. He has had problems with compliance with Aricept in the past.He did not have GI issues.  He just does not like to take medication F/U in 6 months I spent 25 min in total face to face time with the patient more than 50% of which was spent counseling and coordination of care, reviewing test results reviewing medications and discussing and reviewing the diagnosis of mild cognitive impairment and closed head injury.  Additional questions answered Nilda Riggs, [facility], [facility], APRN  [facility] [address] [place], [place] [address] [phone]

## 2018-09-03 ENCOUNTER — Ambulatory Visit: Payer: Medicare PPO | Admitting: Nurse Practitioner

## 2018-09-03 ENCOUNTER — Encounter: Payer: Self-pay | Admitting: Nurse Practitioner

## 2018-09-03 VITALS — BP 121/79 | HR 110 | Ht 69.5 in | Wt 158.0 lb

## 2018-09-03 DIAGNOSIS — Z87828 Personal history of other (healed) physical injury and trauma: Secondary | ICD-10-CM | POA: Diagnosis not present

## 2018-09-03 DIAGNOSIS — R413 Other amnesia: Secondary | ICD-10-CM

## 2018-09-03 DIAGNOSIS — G43709 Chronic migraine without aura, not intractable, without status migrainosus: Secondary | ICD-10-CM

## 2018-09-03 NOTE — Progress Notes (Signed)
I have read the note, and I agree with the clinical assessment and plan.  Travis Bryant K Travis Bryant   

## 2018-09-03 NOTE — Patient Instructions (Signed)
The patient is doing better with the headaches.  He feels his memory is stable. He has stopped his Aricept. He has had problems with compliance with Aricept in the past. F/U in 6 months

## 2018-09-08 ENCOUNTER — Telehealth: Payer: Self-pay

## 2018-09-08 NOTE — Telephone Encounter (Signed)
Pt (last seen 07/18/2017)  walked in to report that he has been having a dull pain in his chest in the center and radiating to his left side the last couple of days.. he denies sob, palpitations, n/v, dizziness associated with it.. he says that his sister wanted him to come in and be seen.. I explained to the pt that we recommend that he be assessed at the hospital via EMS and he declined and said it was not that bad..  I offered for our [facility] to make him an appointment with an APP on Dr. Gibson Ramp team and he agreed... I strongly urged the pt that if he is not feeling well and anything worsens to be sure to go to the ER and not wait for his appt... he verbalized understanding and signed an AMA form since he declined transport to the ER and advised him that it would be unsafe for him to drive if he is not feeling well.  [facility] made pt an appt for tomorrow 09/09/2018 with Herma Carson PA.

## 2018-09-09 ENCOUNTER — Encounter: Payer: Self-pay | Admitting: Physician Assistant

## 2018-09-09 ENCOUNTER — Ambulatory Visit (INDEPENDENT_AMBULATORY_CARE_PROVIDER_SITE_OTHER): Payer: Medicare PPO | Admitting: Physician Assistant

## 2018-09-09 VITALS — BP 130/80 | HR 86 | Ht 69.5 in | Wt 152.8 lb

## 2018-09-09 DIAGNOSIS — I10 Essential (primary) hypertension: Secondary | ICD-10-CM

## 2018-09-09 DIAGNOSIS — R072 Precordial pain: Secondary | ICD-10-CM

## 2018-09-09 DIAGNOSIS — R079 Chest pain, unspecified: Secondary | ICD-10-CM | POA: Insufficient documentation

## 2018-09-09 DIAGNOSIS — I209 Angina pectoris, unspecified: Secondary | ICD-10-CM | POA: Insufficient documentation

## 2018-09-09 DIAGNOSIS — Z8249 Family history of ischemic heart disease and other diseases of the circulatory system: Secondary | ICD-10-CM | POA: Diagnosis not present

## 2018-09-09 NOTE — Progress Notes (Signed)
Cardiology Office Note    Date:  09/09/2018   ID:  Travis Bryant, DOB January 20, 1961, MRN [id number]  PCP:  Jonny Ruiz, MD  Cardiologist: Verne Carrow, MD EPS: None  Chief Complaint  Patient presents with  . Chest Pain    History of Present Illness:  Travis Bryant is a 58 y.o. male with history of hypertension, asthma, atypical chest pain and shortness of breath dating back to 2016 at which time he had a normal stress test and echo.  Chest pain 2017 relieved with GI cocktail CTA negative in 2018 for PE.  Echo at that time normal LV function. GF died 10's heart disease, Father MI 13 died 37.  Patient walked into the office yesterday having dull chest pain radiating to his left chest that is been going on couple days.  He was advised to go to the emergency room but declined and was placed on my schedule.  Complains of a chest tightness and stinging pain into his back-mild, that started 2 days ago. Occurred Sunday while driving and eased when he laid down an hour later. Comes and goes. 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 Did some push ups Saturday which he hadn't been doing for awhile.Still having mild pain with normal EKG. Exercises regularly without chest tightness but does have some DOE related to his asthma.  Past Medical History:  Diagnosis Date  . Asthma   . Benign enlargement of prostate   . Bladder disease   . Environmental allergies   . Hematuria   . 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 History of headache   . HIV exposure   . HTN (hypertension)   . Memory change 01/25/2013  . Restless legs syndrome (RLS) 01/14/2014    Past Surgical History:  Procedure Laterality Date  . ABDOMINAL SURGERY    . BRAIN SURGERY      Current Medications: Current Meds  Medication Sig  . acetaminophen (TYLENOL) 325 MG tablet Take 650 mg by mouth every 6 (six) hours as needed (for pain).   Marland Kitchen albuterol (PROVENTIL HFA;VENTOLIN HFA) 108 (90  Base) MCG/ACT inhaler Inhale 2 puffs into the lungs every 6 (six) hours as needed for wheezing or shortness of breath.  Marland Kitchen amLODipine (NORVASC) 5 MG tablet Take 1 tablet (5 mg total) by mouth daily.  . fluticasone (FLOVENT HFA) 220 MCG/ACT inhaler Inhale 3 puffs into the lungs daily as needed.  Marland Kitchen ipratropium (ATROVENT) 0.03 % nasal spray Place 2 sprays into both nostrils 2 (two) times daily.  Marland Kitchen levocetirizine (XYZAL) 5 MG tablet Take 1 tablet (5 mg total) by mouth every evening.  . tadalafil (CIALIS) 5 MG tablet Take 5 mg by mouth.  . tamsulosin (FLOMAX) 0.4 MG CAPS capsule take 2 tabs by mouth daily     Allergies:   Patient has no known allergies.   Social History   Socioeconomic History  . Marital status: Single    Spouse name: Not on file  . Number of children: 0  . Years of education: college  . Highest education level: Not on file  Occupational History  . Occupation: Teacher-part time    Employer: [employer]  Social Needs  . Financial resource strain: Not on file  . Food insecurity:    Worry: Not on file    Inability: Not on file  . Transportation needs:    Medical: Not on file    Non-medical: Not on  file  Tobacco Use  . Smoking status: Former Smoker    Packs/day: 0.10    Years: 20.00    Pack years: 2.00    Types: Cigarettes    Last attempt to quit: 10/11/2012    Years since quitting: 5.9  . Smokeless tobacco: Never Used  Substance and Sexual Activity  . Alcohol use: Yes    Alcohol/week: 0.0 standard drinks    Comment: socially  . Drug use: No  . Sexual activity: Not on file  Lifestyle  . Physical activity:    Days per week: Not on file    Minutes per session: Not on file  . Stress: Not on file  Relationships  . Social connections:    Talks on phone: Not on file    Gets together: Not on file    Attends religious service: Not on file    Active member of club or organization: Not on file    Attends meetings of clubs or organizations: Not on file     Relationship status: Not on file  Other Topics Concern  . Not on file  Social History Narrative   Patient is left handed.   Patient drinks 3 glasses caffeine daily   Two Masters degrees     Family History:  The patient's family history includes Arthritis/Rheumatoid in his mother; Cancer in his maternal grandmother; Cancer - Prostate in his paternal grandfather; Heart disease in his father and maternal grandfather; Hypertension in his father; Lupus in his mother; Sarcoidosis in his sister.   ROS:   Please see the history of present illness.    Review of Systems  Constitution: Negative.  HENT: Negative.   Cardiovascular: Positive for chest pain and dyspnea on exertion.  Respiratory: Positive for cough.   Endocrine: Negative.   Hematologic/Lymphatic: Negative.   Musculoskeletal: Negative.   Gastrointestinal: Negative.   Genitourinary: Positive for hesitancy.  Neurological: Negative.    All other systems reviewed and are negative.   PHYSICAL EXAM:   VS:  BP 130/80   Pulse 86   Ht 5' 9.5" (1.765 m)   Wt 152 lb 12.8 oz (69.3 kg)   BMI 22.24 kg/m   Physical Exam  GEN: Thin, in no acute distress  Neck: no JVD, carotid bruits, or masses Cardiac: Chest pain reproducible to touch on exam RRR; no murmurs, rubs, or gallops  Respiratory:  clear to auscultation bilaterally, normal work of breathing GI: soft, nontender, nondistended, + BS Ext: without cyanosis, clubbing, or edema, Good distal pulses bilaterally Neuro:  Alert and Oriented x 3 Psych: euthymic mood, full affect  Wt Readings from Last 3 Encounters:  09/09/18 152 lb 12.8 oz (69.3 kg)  09/03/18 158 lb (71.7 kg)  03/25/18 157 lb (71.2 kg)      Studies/Labs Reviewed:   EKG:  EKG is  ordered today.  The ekg ordered today demonstrates normal sinus rhythm normal EKG  Recent Labs: No results found for requested labs within last 8760 hours.   Lipid Panel No results found for: CHOL, TRIG, HDL, CHOLHDL, VLDL, LDLCALC,  LDLDIRECT  Additional studies/ records that were reviewed today include:  Echo May 2018: Left ventricle: The cavity size was normal. Systolic function was   normal. The estimated ejection fraction was in the range of 55%   to 60%. Wall motion was normal; there were no regional wall   motion abnormalities. Doppler parameters are consistent with   abnormal left ventricular relaxation (grade 1 diastolic   dysfunction). There was no evidence  of elevated ventricular   filling pressure by Doppler parameters. - Aortic valve: Trileaflet; normal thickness leaflets. There was   trivial regurgitation. - Aortic root: The aortic root was normal in size. - Mitral valve: Structurally normal valve. There was mild   regurgitation. - Left atrium: The atrium was normal in size. - Right ventricle: The cavity size was normal. Wall thickness was   normal. Systolic function was normal. - Right atrium: The atrium was normal in size. - Tricuspid valve: There was mild regurgitation. - Pulmonary arteries: Systolic pressure was within the normal   range. - Inferior vena cava: The vessel was normal in size. - Pericardium, extracardiac: There was no pericardial effusion.   CTA 5/7/18IMPRESSION: 1. No acute findings.  No evidence for acute pulmonary embolus.   GXT 10/12/2014 ETT Interpretation:  normal - no evidence of ischemia by ST  analysis  Comments: LVH by voltage No ischemic EKG changes Fair exercise tolerance Brief period of "chest tightness" Hypertensive BP response to exercise  Chrystie NoseKenneth C. Hilty, MD, [facility] HospitalFACC Attending Cardiologist [facility]           ASSESSMENT:    1. Precordial pain   2. Benign essential HTN   3. Family history of early CAD      PLAN:  In order of problems listed above:  Chest pain patient has long history of atypical chest pain.  Normal GXT 2016 normal CTA 12/2016, normal LV function on 2D echo 12/2016.  Chest pain is reproducible to touch.  Most likely  secondary to doing push-ups on Saturday.  Essential hypertension blood pressure controlled with amlodipine.  Family history of early CAD.  Offered him surveillance GXT since it has been 3 years but he like to hold off for now and will call if he has any further problems.  Medication Adjustments/Labs and Tests Ordered: Current medicines are reviewed at length with the patient today.  Concerns regarding medicines are outlined above.  Medication changes, Labs and Tests ordered today are listed in the Patient Instructions below. Patient Instructions  Medication Instructions:  Your physician recommends that you continue on your current medications as directed. Please refer to the Current Medication list given to you today.  If you need a refill on your cardiac medications before your next appointment, please call your pharmacy.   Lab work: None ordered  If you have labs (blood work) drawn today and your tests are completely normal, you will receive your results only by: Marland Kitchen. MyChart Message (if you have MyChart) OR . A paper copy in the mail If you have any lab test that is abnormal or we need to change your treatment, we will call you to review the results.  Testing/Procedures: None ordered  Follow-Up: [facility], you and your health needs are our priority.  As part of our continuing mission to provide you with exceptional heart care, we have created designated Provider Care Teams.  These Care Teams include your primary Cardiologist (physician) and Advanced Practice Providers (APPs -  Physician Assistants and Nurse Practitioners) who all work together to provide you with the care you need, when you need it. You will need a follow up appointment in 12 months.  Please call our office 2 months in advance to schedule this appointment.  You may see Verne Carrowhristopher McAlhany, MD or one of the following Advanced Practice Providers on your designated Care Team:   WoodwayBrittainy Simmons, PA-C Ronie Spiesayna Dunn,  PA-C . Jacolyn ReedyMichele , PA-C  Any Other Special Instructions Will Be  Listed Below (If Applicable).       Elson ClanSigned,  , PA-C  09/09/2018 12:53 PM    [facility] [address], [place], [place]  [address] Phone: [phone]; Fax: [phone]

## 2018-09-09 NOTE — Patient Instructions (Addendum)
Medication Instructions:  Your physician recommends that you continue on your current medications as directed. Please refer to the Current Medication list given to you today.  If you need a refill on your cardiac medications before your next appointment, please call your pharmacy.   Lab work: None ordered  If you have labs (blood work) drawn today and your tests are completely normal, you will receive your results only by: . MyChart Message (if you have MyChart) OR . A paper copy in the mail If you have any lab test that is abnormal or we need to change your treatment, we will call you to review the results.  Testing/Procedures: None ordered  Follow-Up: At [facility], you and your health needs are our priority.  As part of our continuing mission to provide you with exceptional heart care, we have created designated Provider Care Teams.  These Care Teams include your primary Cardiologist (physician) and Advanced Practice Providers (APPs -  Physician Assistants and Nurse Practitioners) who all work together to provide you with the care you need, when you need it. You will need a follow up appointment in 12 months.  Please call our office 2 months in advance to schedule this appointment.  You may see Christopher McAlhany, MD or one of the following Advanced Practice Providers on your designated Care Team:   Brittainy Simmons, PA-C Dayna Dunn, PA-C . Michele Lenze, PA-C  Any Other Special Instructions Will Be Listed Below (If Applicable).    

## 2018-10-20 ENCOUNTER — Other Ambulatory Visit: Payer: Self-pay | Admitting: Cardiovascular Disease

## 2018-10-21 NOTE — Telephone Encounter (Signed)
OK to refill. Pt was seen by Jacolyn Reedy, PA on 09/09/18

## 2018-11-11 ENCOUNTER — Other Ambulatory Visit: Payer: Self-pay | Admitting: Allergy and Immunology

## 2018-11-11 DIAGNOSIS — J3089 Other allergic rhinitis: Secondary | ICD-10-CM

## 2018-11-21 ENCOUNTER — Other Ambulatory Visit: Payer: Self-pay | Admitting: Allergy and Immunology

## 2018-11-21 DIAGNOSIS — J3089 Other allergic rhinitis: Secondary | ICD-10-CM

## 2018-11-23 ENCOUNTER — Telehealth: Payer: Self-pay

## 2018-11-23 NOTE — Telephone Encounter (Signed)
Patient called wanting a refill on his allegra. I told him that he hasn't been seen since 2018. Patient stated nobody told him to make an appt. I told him when he checks out appts get made. Patient states someone should have called Travis Bryant and told him he needed a visit. I told him that we denied his meds twice and his pharmacy should have told him this. Patient states he has been doing fine and doesn't need a visit.  Please Advise .

## 2018-11-23 NOTE — Telephone Encounter (Signed)
Patient has been made aware that he will need an office visit for further refills. He did acknowledge understanding of this information and will schedule a visit. I did inform him that he is more than welcome to purchase the allegra otc.

## 2018-11-30 ENCOUNTER — Ambulatory Visit (INDEPENDENT_AMBULATORY_CARE_PROVIDER_SITE_OTHER): Payer: Medicare PPO | Admitting: Allergy and Immunology

## 2018-11-30 ENCOUNTER — Encounter: Payer: Self-pay | Admitting: Allergy and Immunology

## 2018-11-30 ENCOUNTER — Other Ambulatory Visit: Payer: Self-pay

## 2018-11-30 DIAGNOSIS — J4530 Mild persistent asthma, uncomplicated: Secondary | ICD-10-CM

## 2018-11-30 DIAGNOSIS — K219 Gastro-esophageal reflux disease without esophagitis: Secondary | ICD-10-CM | POA: Diagnosis not present

## 2018-11-30 DIAGNOSIS — J3089 Other allergic rhinitis: Secondary | ICD-10-CM | POA: Diagnosis not present

## 2018-11-30 MED ORDER — LEVOCETIRIZINE DIHYDROCHLORIDE 5 MG PO TABS: 5.0000 mg | ORAL_TABLET | Freq: Every evening | ORAL | 5 refills | Status: DC

## 2018-11-30 MED ORDER — FAMOTIDINE 20 MG PO TABS
20.0000 mg | ORAL_TABLET | Freq: Two times a day (BID) | ORAL | 3 refills | Status: DC
Start: 2018-11-30 — End: 2019-03-26

## 2018-11-30 MED ORDER — FLUTICASONE PROPIONATE HFA 220 MCG/ACT IN AERO: 3.0000 | INHALATION_SPRAY | Freq: Every day | RESPIRATORY_TRACT | 5 refills | Status: DC | PRN

## 2018-11-30 NOTE — Assessment & Plan Note (Signed)
   Continue appropriate reflux lifestyle modifications.  A prescription has been provided for famotidine (Pepcid) 20 mg twice daily.

## 2018-11-30 NOTE — Assessment & Plan Note (Signed)
   Continue appropriate allergen avoidance measures, fluticasone nasal spray as needed, and nasal saline irrigation as needed.  A refill prescription has been provided for levocetirizine 5 mg daily as needed.  For thick post nasal drainage, add guaifenesin [phone] mg (Mucinex)  twice daily as needed with adequate hydration as discussed.  If allergen avoidance measures and medications fail to adequately relieve symptoms, aeroallergen immunotherapy will be considered.

## 2018-11-30 NOTE — Assessment & Plan Note (Signed)
   Restart Flovent 220 g, one inhalation via spacer device twice a day.   During respiratory tract infections or asthma flares, increase Flovent 220g to 3 inhalations 2 times per day until symptoms have returned to baseline.  Continue albuterol HFA, 1-2 inhalations every 4-6 hours as needed.  Subjective and objective measures of pulmonary function will be followed and the treatment plan will be adjusted accordingly.
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
## 2018-11-30 NOTE — Progress Notes (Signed)
Start time:  1600 Finish Time:  1631 Where are you located:  home Do you give [place] permission to bill your insurance:  yes Are you signed up for my chart:  No, sent text link

## 2018-11-30 NOTE — Patient Instructions (Addendum)
Mild persistent asthma  Restart Flovent 220 g, one inhalation via spacer device twice a day.   During respiratory tract infections or asthma flares, increase Flovent 220g to 3 inhalations 2 times per day until symptoms have returned to baseline.  Continue albuterol HFA, 1-2 inhalations every 4-6 hours as needed.  Subjective and objective measures of pulmonary function will be followed and the treatment plan will be adjusted accordingly.  Seasonal and perennial allergic rhinitis  Continue appropriate allergen avoidance measures, fluticasone nasal spray as needed, and nasal saline irrigation as needed.  A refill prescription has been provided for levocetirizine 5 mg daily as needed.  For thick post nasal drainage, add guaifenesin [phone] mg (Mucinex)  twice daily as needed with adequate hydration as discussed.  If allergen avoidance measures and medications fail to adequately relieve symptoms, aeroallergen immunotherapy will be considered.  Gastroesophageal reflux  Continue appropriate reflux lifestyle modifications.  A prescription has been provided for famotidine (Pepcid) 20 mg twice daily.   Return in about 3 months (around 03/01/2019), or if symptoms worsen or fail to improve.

## 2019-03-04 ENCOUNTER — Ambulatory Visit: Payer: Medicare PPO | Admitting: Neurology

## 2019-03-04 ENCOUNTER — Telehealth: Payer: Self-pay | Admitting: Neurology

## 2019-03-04 NOTE — Telephone Encounter (Signed)
Please call the patient to reschedule him for an office visit. He could not get his video to work for [employer]. Thank you.

## 2019-03-04 NOTE — Telephone Encounter (Signed)
Per NP, patient was unable to connect for video visit today. I called him, and we rescheduled for in office visit at her next available. He understands to arrive early, wear a mask, and he verbalized understanding, appreciation.

## 2019-03-15 ENCOUNTER — Ambulatory Visit: Payer: Medicare PPO | Admitting: Allergy and Immunology
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
## 2019-03-17 ENCOUNTER — Other Ambulatory Visit: Payer: Self-pay

## 2019-03-17 ENCOUNTER — Encounter: Payer: Self-pay | Admitting: Neurology

## 2019-03-17 ENCOUNTER — Ambulatory Visit (INDEPENDENT_AMBULATORY_CARE_PROVIDER_SITE_OTHER): Payer: Medicare PPO | Admitting: Neurology

## 2019-03-17 VITALS — BP 124/73 | HR 97 | Temp 97.1°F | Ht 69.5 in | Wt 155.8 lb

## 2019-03-17 DIAGNOSIS — G43709 Chronic migraine without aura, not intractable, without status migrainosus: Secondary | ICD-10-CM | POA: Diagnosis not present

## 2019-03-17 DIAGNOSIS — G2581 Restless legs syndrome: Secondary | ICD-10-CM

## 2019-03-17 DIAGNOSIS — R413 Other amnesia: Secondary | ICD-10-CM

## 2019-03-17 NOTE — Patient Instructions (Addendum)
It was wonderful to meet you today! Please try avoid benadryl in the evening to see if that helps with RLS symptoms. Also, try exercise, avoiding caffeine. We will see you in 8 months.

## 2019-03-17 NOTE — Progress Notes (Signed)
I have read the note, and I agree with the clinical assessment and plan.  Charles K Willis   

## 2019-03-26 ENCOUNTER — Ambulatory Visit (INDEPENDENT_AMBULATORY_CARE_PROVIDER_SITE_OTHER): Payer: Medicare PPO | Admitting: Family Medicine

## 2019-03-26 ENCOUNTER — Encounter: Payer: Self-pay | Admitting: Family Medicine

## 2019-03-26 ENCOUNTER — Other Ambulatory Visit: Payer: Self-pay | Admitting: Family Medicine

## 2019-03-26 ENCOUNTER — Other Ambulatory Visit: Payer: Self-pay

## 2019-03-26 VITALS — BP 128/70 | HR 91 | Temp 97.4°F | Resp 16 | Ht 69.5 in

## 2019-03-26 DIAGNOSIS — J3089 Other allergic rhinitis: Secondary | ICD-10-CM

## 2019-03-26 DIAGNOSIS — J4531 Mild persistent asthma with (acute) exacerbation: Secondary | ICD-10-CM

## 2019-03-26 DIAGNOSIS — K219 Gastro-esophageal reflux disease without esophagitis: Secondary | ICD-10-CM | POA: Diagnosis not present

## 2019-03-26 MED ORDER — LEVOCETIRIZINE DIHYDROCHLORIDE 5 MG PO TABS: 5.0000 mg | ORAL_TABLET | Freq: Every evening | ORAL | 5 refills | Status: DC

## 2019-03-26 MED ORDER — FAMOTIDINE 20 MG PO TABS: 20.0000 mg | ORAL_TABLET | Freq: Two times a day (BID) | ORAL | 5 refills | Status: DC

## 2019-03-26 MED ORDER — ALBUTEROL SULFATE HFA 108 (90 BASE) MCG/ACT IN AERS: 2.0000 | INHALATION_SPRAY | Freq: Four times a day (QID) | RESPIRATORY_TRACT | 1 refills | Status: DC | PRN

## 2019-03-26 MED ORDER — FLUTICASONE PROPIONATE HFA 220 MCG/ACT IN AERO: 3.0000 | INHALATION_SPRAY | Freq: Every day | RESPIRATORY_TRACT | 5 refills | Status: DC | PRN

## 2019-03-26 NOTE — Patient Instructions (Signed)
Asthma For now, and for asthma flares, begin Flovent 220-2 puffs twice a day with a spacer for 2 weeks or until you are cough and wheeze free Continue albuterol 2 puffs every 4 hours as needed for cough or wheeze  Allergic rhinitis Continue Flonase 1-2 sprays in each nostril once a day as needed for a stuffy nose Continue Xyzal once a day as needed for a runny nose Consider saline nasal rinses as needed for nasal symptoms. Use this before any medicated nasal sprays for best result Consider Mucinex [phone] mg twice a day as needed for thick post nasal drainage  Reflux Continue dietary and lifestyle modifications as listed below Begin famotidine 20 mg twice a day as needed for control of reflux  Call the clinic if this treatment plan is not working well for you  Follow up in 2 months or sooner if needed

## 2019-03-26 NOTE — Progress Notes (Signed)
[address] [place] [address] Dept: [phone]  FOLLOW UP NOTE  Patient ID: Travis Bryant, male    DOB: 11-02-1960  Age: 58 y.o. MRN: [id number] Date of Office Visit: 03/26/2019  Assessment  Chief Complaint: Asthma  HPI Travis Bryant is a 58 year old male who presents to the clinic for a follow up visit. He was last seen in the clinic on 11/30/2018 for evaluation of asthma, allergic rhinitis, and reflux. At today's visit, he reports that his "asthma has been acting up" for about one week with shortness of breath, a "little bit of wheezing", and some chest tightness which has improved a bit throughout this week. He reports he has been using either albuterol or Flovent 220 interchangeably with one puff over the last week with slight improvement in his symptoms. Allergic rhinitis is reported as well controlled with no symptoms.He takes Xyzal once a day and rarely uses Flonase or saline nasal rinses. He denies heartburn or reflux and stopped taking famotidine several months ago. His current medications are listed in the chart.   Drug Allergies:  No Known Allergies  Physical Exam: BP 128/70 (BP Location: Right Arm, Cuff Size: Normal)   Pulse 91   Temp (!) 97.4 F (36.3 C) (Temporal)   Resp 16   Ht 5' 9.5" (1.765 m)   SpO2 97%   BMI 22.68 kg/m    Physical Exam Vitals signs reviewed.  Constitutional:      Appearance: Normal appearance.  HENT:     Head: Normocephalic and atraumatic.     Right Ear: Tympanic membrane normal.     Left Ear: Tympanic membrane normal.     Nose:     Comments: Bilateral nares slightly erythematous with clear nasal drainage noted. Pharynx normal. Ears normal. Eyes normal    Mouth/Throat:     Pharynx: Oropharynx is clear.  Eyes:     Conjunctiva/sclera: Conjunctivae normal.  Neck:     Musculoskeletal: Normal range of motion and neck supple.  Cardiovascular:     Rate and Rhythm: Normal rate and regular rhythm.     Heart sounds: Normal heart  sounds. No murmur.  Pulmonary:     Effort: Pulmonary effort is normal.     Breath sounds: Normal breath sounds.     Comments: Lungs clear to auscultation Musculoskeletal: Normal range of motion.  Skin:    General: Skin is warm and dry.  Neurological:     Mental Status: He is alert and oriented to person, place, and time.  Psychiatric:        Mood and Affect: Mood normal.        Behavior: Behavior normal.        Thought Content: Thought content normal.        Judgment: Judgment normal.     Diagnostics: FVC 4.02, FEV1 2.51, predicted FVC 3.87, predicted FEV1 3.02. Spirometry indicates mild obstruction.   Assessment and Plan: 1. Mild persistent asthma with acute exacerbation   2. Seasonal and perennial allergic rhinitis   3. Gastroesophageal reflux disease, esophagitis presence not specified     Meds ordered this encounter  Medications  . fluticasone (FLOVENT HFA) 220 MCG/ACT inhaler    Sig: Inhale 3 puffs into the lungs daily as needed (2 PUFFS TWICE DAILY FOR TWO WEEKS 03/26/2019-04/09/2019).    Dispense:  1 Inhaler    Refill:  5  . albuterol (VENTOLIN HFA) 108 (90 Base) MCG/ACT inhaler    Sig: Inhale 2 puffs into the  lungs every 6 (six) hours as needed for wheezing or shortness of breath.    Dispense:  18 g    Refill:  1  . famotidine (PEPCID) 20 MG tablet    Sig: Take 1 tablet (20 mg total) by mouth 2 (two) times daily.    Dispense:  60 tablet    Refill:  5  . levocetirizine (XYZAL) 5 MG tablet    Sig: Take 1 tablet (5 mg total) by mouth every evening.    Dispense:  30 tablet    Refill:  5    Patient Instructions  Asthma For now, and for asthma flares, begin Flovent 220-2 puffs twice a day with a spacer for 2 weeks or until you are cough and wheeze free Continue albuterol 2 puffs every 4 hours as needed for cough or wheeze  Allergic rhinitis Continue Flonase 1-2 sprays in each nostril once a day as needed for a stuffy nose Continue Xyzal once a day as needed for a  runny nose Consider saline nasal rinses as needed for nasal symptoms. Use this before any medicated nasal sprays for best result Consider Mucinex [id number] mg twice a day as needed for thick post nasal drainage  Reflux Continue dietary and lifestyle modifications as listed below Begin famotidine 20 mg twice a day as needed for control of reflux  Call the clinic if this treatment plan is not working well for you  Follow up in 2 months or sooner if needed   Return in about 2 months (around 05/26/2019), or if symptoms worsen or fail to improve.    Thank you for the opportunity to care for this patient.  Please do not hesitate to contact me with questions.  Gareth Morgan, FNP [facility]

## 2019-03-30 ENCOUNTER — Telehealth: Payer: Self-pay | Admitting: *Deleted

## 2019-03-30 NOTE — Telephone Encounter (Signed)
I spoke with pt and scheduled him to see Dr. Angelena Form on August 27,2020 at 11:40

## 2019-03-30 NOTE — Telephone Encounter (Signed)
Pt walked into the office to speak with Dr. Camillia Herter RN about making an appt with Dr. Angelena Form or an APP, for noted chest soreness for a couple weeks now.  Pt states he also feels fatigued.  Pt states he recently saw his Allergist for this issue, and he said it was an acute exacerbation of his asthma.  Pt wants to make sure this isn't his heart as well.  Pt was in no obvious distress while speaking with him in the lobby.  Pt does not meet criteria to be referred to the ER.  Endorsed to the pt that I will send this message to Dr. Camillia Herter RN now and have her call him back before 5 pm, to help in arranging him an appt with Dr. Angelena Form, or an APP on his team.  Pts cell number to be reached is [phone], and he will await a call back.  Pt was appreciative for all the assistance provided.

## 2019-04-07 NOTE — Progress Notes (Signed)
Chief Complaint  Patient presents with  . Follow-up    chest pain   History of Present Illness: 58 yo male with history of HTN, asthma, remote tobacco abuse, enlarged prostate here today for cardiac follow up. I saw him as a new patient 10/12/14 for evaluation of chest pain and SOB. Prior to his visit in 2016 he described chest pain and dyspnea at rest. He had no exertional chest pain. Exercise stress test in 2016 showed no ischemic EKG changes. Echo 10/17/14 with normal LV size and function, no significant valve disease. He was seen in the ED 12/06/15 for chest pain. He ruled out for MI. Chest pain relieved with a GI cocktail. He had recurrent dyspnea in May of 2018 after a flight from [place]. CTA May 2018 negative for PE. Echo May 2018 with normal LV systolic function  He is here today for follow up. He has been having more exertional chest pain and pressure. Mostly during exercise and also during sex. Associated dyspnea. The patient denies any palpitations, lower extremity edema, orthopnea, PND, dizziness, near syncope or syncope.   Primary Care Physician: Jonny Ruiz, MD  Past Medical History:  Diagnosis Date  . Asthma   . Benign enlargement of prostate   . Bladder disease   . Environmental allergies   . Hematuria   . 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 History of headache   . HIV exposure   . HTN (hypertension)   . Memory change 01/25/2013  . Restless legs syndrome (RLS) 01/14/2014    Past Surgical History:  Procedure Laterality Date  . ABDOMINAL SURGERY    . BRAIN SURGERY      Current Outpatient Medications  Medication Sig Dispense Refill  . acetaminophen (TYLENOL) 325 MG tablet Take 650 mg by mouth every 6 (six) hours as needed (for pain).     Marland Kitchen albuterol (VENTOLIN HFA) 108 (90 Base) MCG/ACT inhaler INHALE 2 PUFFS INTO THE LUNGS EVERY 6 HOURS AS NEEDED FOR WHEEZING OR SHORTNESS OF BREATH 54 g 0  . amLODipine (NORVASC) 5 MG tablet TAKE 1  TABLET BY MOUTH EVERY DAY 90 tablet 3  . DESCOVY 200-25 MG tablet Take 1 tablet by mouth daily.    . famotidine (PEPCID) 20 MG tablet Take 1 tablet (20 mg total) by mouth 2 (two) times daily. 60 tablet 5  . fluticasone (FLOVENT HFA) 220 MCG/ACT inhaler Inhale 3 puffs into the lungs daily as needed (2 PUFFS TWICE DAILY FOR TWO WEEKS 03/26/2019-04/09/2019). 1 Inhaler 5  . ipratropium (ATROVENT) 0.03 % nasal spray Place 2 sprays into both nostrils 2 (two) times daily. 30 mL 0  . levocetirizine (XYZAL) 5 MG tablet Take 1 tablet (5 mg total) by mouth every evening. 30 tablet 5  . tadalafil (CIALIS) 5 MG tablet Take 5 mg by mouth.    . tamsulosin (FLOMAX) 0.4 MG CAPS capsule take 2 tabs by mouth daily    . metoprolol tartrate (LOPRESSOR) 100 MG tablet Take one tablet by mouth 2 hours prior to your CT 1 tablet 0   No current facility-administered medications for this visit.     No Known Allergies  Social History   Socioeconomic History  . Marital status: Single    Spouse name: Not on file  . Number of children: 0  . Years of education: college  . Highest education level: Not on file  Occupational History  . Occupation: Production manager:  [employer]  Social Needs  . Financial resource strain: Not on file  . Food insecurity    Worry: Not on file    Inability: Not on file  . Transportation needs    Medical: Not on file    Non-medical: Not on file  Tobacco Use  . Smoking status: Former Smoker    Packs/day: 0.10    Years: 20.00    Pack years: 2.00    Types: Cigarettes    Quit date: 10/11/2012    Years since quitting: 6.4  . Smokeless tobacco: Never Used  . Tobacco comment: social smoker  Substance and Sexual Activity  . Alcohol use: Yes    Alcohol/week: 0.0 standard drinks    Comment: socially  . Drug use: No  . Sexual activity: Not on file  Lifestyle  . Physical activity    Days per week: Not on file    Minutes per session: Not on file  . Stress: Not on  file  Relationships  . Social Herbalist on phone: Not on file    Gets together: Not on file    Attends religious service: Not on file    Active member of club or organization: Not on file    Attends meetings of clubs or organizations: Not on file    Relationship status: Not on file  . Intimate partner violence    Fear of current or ex partner: Not on file    Emotionally abused: Not on file    Physically abused: Not on file    Forced sexual activity: Not on file  Other Topics Concern  . Not on file  Social History Narrative   Patient is left handed.   Patient drinks 3 glasses caffeine daily   Two Masters degrees    Family History  Problem Relation Age of Onset  . Lupus Mother   . Arthritis/Rheumatoid Mother   . Hypertension Father   . Heart disease Father   . Sarcoidosis Sister   . Cancer Maternal Grandmother   . Heart disease Maternal Grandfather   . Cancer - Prostate Paternal Grandfather   . Allergic rhinitis Neg Hx   . Angioedema Neg Hx   . Asthma Neg Hx   . Eczema Neg Hx   . Immunodeficiency Neg Hx   . Urticaria Neg Hx     Review of Systems:  As stated in the HPI and otherwise negative.   BP 120/72   Pulse 84   Ht 5' 9.5" (1.765 m)   Wt 154 lb 1.9 oz (69.9 kg)   SpO2 98%   BMI 22.43 kg/m   Physical Examination:  General: Well developed, well nourished, NAD  HEENT: OP clear, mucus membranes moist  SKIN: warm, dry. No rashes. Neuro: No focal deficits  Musculoskeletal: Muscle strength 5/5 all ext  Psychiatric: Mood and affect normal  Neck: No JVD, no carotid bruits, no thyromegaly, no lymphadenopathy.  Lungs:Clear bilaterally, no wheezes, rhonci, crackles Cardiovascular: Regular rate and rhythm. No murmurs, gallops or rubs. Abdomen:Soft. Bowel sounds present. Non-tender.  Extremities: No lower extremity edema. Pulses are 2 + in the bilateral DP/PT.  Echo May 2018: Left ventricle: The cavity size was normal. Systolic function was normal.  The estimated ejection fraction was in the range of 55% to 60%. Wall motion was normal; there were no regional wall motion abnormalities. Doppler parameters are consistent with abnormal left ventricular relaxation (grade 1 diastolic dysfunction). There was no evidence of elevated ventricular filling pressure  by Doppler parameters. - Aortic valve: Trileaflet; normal thickness leaflets. There was trivial regurgitation. - Aortic root: The aortic root was normal in size. - Mitral valve: Structurally normal valve. There was mild regurgitation. - Left atrium: The atrium was normal in size. - Right ventricle: The cavity size was normal. Wall thickness was normal. Systolic function was normal. - Right atrium: The atrium was normal in size. - Tricuspid valve: There was mild regurgitation. - Pulmonary arteries: Systolic pressure was within the normal range. - Inferior vena cava: The vessel was normal in size. - Pericardium, extracardiac: There was no pericardial effusion.  EKG:  EKG is ordered today. The ekg ordered today demonstrates NSR, rate 84 bpm. Non-specific T wave abn.   Recent Labs: No results found for requested labs within last 8760 hours.   Lipid Panel No results found for: CHOL, TRIG, HDL, CHOLHDL, VLDL, LDLCALC, LDLDIRECT   Wt Readings from Last 3 Encounters:  04/08/19 154 lb 1.9 oz (69.9 kg)  03/17/19 155 lb 12.8 oz (70.7 kg)  09/09/18 152 lb 12.8 oz (69.3 kg)     Other studies Reviewed: Additional studies/ records that were reviewed today include: . Review of the above records demonstrates:    Assessment and Plan:   1. Chest pain: He has had normal LVEF by several echocardiograms over the past 4 years. Normal stress test in 2016. Now having exertional chest pain. EKG with new T wave inversions. Will plan cardiac CTA with FFR to exclude obstructive CAD. BMET today.   2. 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   Current medicines are  reviewed at length with the patient today.  The patient does not have concerns regarding medicines.  The following changes have been made:  no change  Labs/ tests ordered today include:   Orders Placed This Encounter  Procedures  . CT CORONARY MORPH W/CTA COR W/SCORE W/CA W/CM &/OR WO/CM  . CT CORONARY FRACTIONAL FLOW RESERVE DATA PREP  . CT CORONARY FRACTIONAL FLOW RESERVE FLUID ANALYSIS  . Basic metabolic panel   Disposition:   FU with me in one year.   Signed, Verne Carrowhristopher , MD 04/08/2019 12:02 PM    [facility] [address], [place], [place]  [address] Phone: [phone](336) [phone]; Fax: [phone](336) [phone]

## 2019-04-08 ENCOUNTER — Encounter: Payer: Self-pay | Admitting: Cardiovascular Disease

## 2019-04-08 ENCOUNTER — Ambulatory Visit (INDEPENDENT_AMBULATORY_CARE_PROVIDER_SITE_OTHER): Payer: Medicare PPO | Admitting: Cardiovascular Disease

## 2019-04-08 ENCOUNTER — Other Ambulatory Visit: Payer: Self-pay

## 2019-04-08 DIAGNOSIS — R072 Precordial pain: Secondary | ICD-10-CM

## 2019-04-08 LAB — BASIC METABOLIC PANEL WITH GFR
BUN/Creatinine Ratio: 9 (ref 9–20)
BUN: 12 mg/dL (ref 6–24)
CO2: 20 mmol/L (ref 20–29)
Calcium: 9 mg/dL (ref 8.7–10.2)
Chloride: 105 mmol/L (ref 96–106)
Creatinine, Ser: 1.3 mg/dL — ABNORMAL HIGH (ref 0.76–1.27)
GFR calc Af Amer: 69 mL/min/1.73 (ref >59)
GFR calc non Af Amer: 60 mL/min/1.73 (ref >59)
Glucose: 88 mg/dL (ref 65–99)
Potassium: 4.1 mmol/L (ref 3.5–5.2)
Sodium: 140 mmol/L (ref 134–144)

## 2019-04-08 MED ORDER — METOPROLOL TARTRATE 100 MG PO TABS: ORAL_TABLET | ORAL | 0 refills | Status: DC

## 2019-04-08 NOTE — Patient Instructions (Addendum)
Medication Instructions:  Your physician recommends that you continue on your current medications as directed. Please refer to the Current Medication list given to you today.  If you need a refill on your cardiac medications before your next appointment, please call your pharmacy.   Lab work: BMET today  If you have labs (blood work) drawn today and your tests are completely normal, you will receive your results only by: Marland Kitchen. MyChart Message (if you have MyChart) OR . A paper copy in the mail If you have any lab test that is abnormal or we need to change your treatment, we will call you to review the results.  Testing/Procedures: Your physician recommends that you have a Coronary CT performed.   Follow-Up: At [facility], you and your health needs are our priority.  As part of our continuing mission to provide you with exceptional heart care, we have created designated Provider Care Teams.  These Care Teams include your primary Cardiologist (physician) and Advanced Practice Providers (APPs -  Physician Assistants and Nurse Practitioners) who all work together to provide you with the care you need, when you need it. You will need a follow up appointment in 12 months.  Please call our office 2 months in advance to schedule this appointment.  You may see Verne Carrowhristopher McAlhany, MD or one of the following Advanced Practice Providers on your designated Care Team:   [place]ittainy Simmons, PA-C Ronie Spiesayna Dunn, PA-C . Jacolyn ReedyMichele Lenze, PA-C  Any Other Special Instructions Will Be Listed Below (If Applicable).  Your cardiac CT will be scheduled at one of the below locations:   [facility] [address] [place], [place] [address] [phone]  OR  [facility] [address] [place], [place] [address] [phone]  Please [facility] the [facility] main entrance of [facility] 30-45 minutes prior to test start time. Proceed to the  [facility] Department (first floor) to check-in and test prep.  Please follow these instructions carefully (unless otherwise directed):  Hold all erectile dysfunction medications at least 48 hours prior to test.  On the Night Before the Test: . Be sure to Drink plenty of water. . Do not consume any caffeinated/decaffeinated beverages or chocolate 12 hours prior to your test. . Do not take any antihistamines 12 hours prior to your test. . If you take Metformin do not take 24 hours prior to test.  On the Day of the Test: . Drink plenty of water. Do not drink any water within one hour of the test. . Do not eat any food 4 hours prior to the test. . You may take your regular medications prior to the test.  . Take metoprolol (Lopressor) two hours prior to test. . HOLD Furosemide/Hydrochlorothiazide morning of the test.       After the Test: . Drink plenty of water. . After receiving IV contrast, you may experience a mild flushed feeling. This is normal. . On occasion, you may experience a mild rash up to 24 hours after the test. This is not dangerous. If this occurs, you can take Benadryl 25 mg and increase your fluid intake. . If you experience trouble breathing, this can be serious. If it is severe call 911 IMMEDIATELY. If it is mild, please call our office. . If you take any of these medications: Glipizide/Metformin, Avandament, Glucavance, please do not take 48 hours after completing test.    Please contact the cardiac imaging nurse navigator should  you have any questions/concerns Marchia Bond, RN Navigator Cardiac Imaging [facility] [phone] Office  [phone] Cell

## 2019-04-16 ENCOUNTER — Telehealth (HOSPITAL_COMMUNITY): Payer: Self-pay | Admitting: Emergency Medicine

## 2019-04-16 NOTE — Telephone Encounter (Signed)
Reaching out to patient to offer assistance regarding upcoming cardiac imaging study; pt verbalizes understanding of appt date/time, parking situation and where to check in, pre-test NPO status and medications ordered, and verified current allergies; name and call back number provided for further questions should they arise Marchia Bond RN Navigator Cardiac Imaging [place] [facility] [phone] office [phone] cell  After dicussion with patient about his asthma and after discussion with Dr. Angelena Form, it was decided that the patient NOT take prescribed metoprolol prior to CTA. We will give IV dose cardizem prior to scan if needed for HR control.  Pt denies covid symptoms, verbalized understanding of visitor policy.

## 2019-04-20 ENCOUNTER — Ambulatory Visit (HOSPITAL_COMMUNITY)
Admission: RE | Admit: 2019-04-20 | Discharge: 2019-04-20 | Disposition: A | Discharge status: Home / Self Care | Payer: Medicare PPO | Source: Ambulatory Visit | Attending: Cardiovascular Disease | Admitting: Cardiovascular Disease

## 2019-04-20 ENCOUNTER — Other Ambulatory Visit: Payer: Self-pay

## 2019-04-20 DIAGNOSIS — R072 Precordial pain: Secondary | ICD-10-CM

## 2019-04-20 NOTE — Progress Notes (Signed)
Spoke to MD regarding patient taking Cialis three days ago. MD stated patient needed to be 5 days to a week off of Cialis to do exam. Patient to be rescheduled. Marchia Bond RN navigator contacted about situation to reschedule.

## 2019-04-22 ENCOUNTER — Other Ambulatory Visit: Payer: Self-pay | Admitting: *Deleted

## 2019-04-22 DIAGNOSIS — R072 Precordial pain: Secondary | ICD-10-CM

## 2019-04-26 ENCOUNTER — Telehealth (HOSPITAL_COMMUNITY): Payer: Self-pay | Admitting: Emergency Medicine

## 2019-04-26 NOTE — Telephone Encounter (Signed)
Reaching out to patient to offer assistance regarding upcoming cardiac imaging study; pt verbalizes understanding of appt date/time, parking situation and where to check in, pre-test NPO status and medications ordered, and verified current allergies; name and call back number provided for further questions should they arise Marchia Bond RN [place] [facility] [phone] office [phone] cell  Pt states no ED medications in 7-8 days; denies covid

## 2019-04-27 ENCOUNTER — Ambulatory Visit (HOSPITAL_COMMUNITY)
Admission: RE | Admit: 2019-04-27 | Discharge: 2019-04-27 | Disposition: A | Discharge status: Home / Self Care | Payer: Medicare PPO | Source: Ambulatory Visit | Attending: Cardiovascular Disease | Admitting: Cardiovascular Disease

## 2019-04-27 ENCOUNTER — Other Ambulatory Visit: Payer: Self-pay

## 2019-04-27 DIAGNOSIS — Z006 Encounter for examination for normal comparison and control in clinical research program: Secondary | ICD-10-CM

## 2019-04-27 DIAGNOSIS — R072 Precordial pain: Secondary | ICD-10-CM

## 2019-04-27 MED ORDER — IOHEXOL 350 MG/ML SOLN
100.0000 mL | Freq: Once | INTRAVENOUS | Status: AC | PRN
  Administered 2019-04-27: 16:00:00 100 mL via INTRAVENOUS

## 2019-04-27 MED ORDER — NITROGLYCERIN 0.4 MG SL SUBL
0.8000 mg | SUBLINGUAL_TABLET | Freq: Once | SUBLINGUAL | Status: AC
  Administered 2019-04-27: 0.8 mg via SUBLINGUAL
  Filled 2019-04-27: qty 25

## 2019-04-27 MED ORDER — DILTIAZEM HCL 25 MG/5ML IV SOLN
INTRAVENOUS | Status: AC
  Filled 2019-04-27: qty 5

## 2019-04-27 MED ORDER — DILTIAZEM HCL 25 MG/5ML IV SOLN
5.0000 mg | Freq: Once | INTRAVENOUS | Status: AC
  Administered 2019-04-27: 5 mg via INTRAVENOUS
  Filled 2019-04-27: qty 5

## 2019-04-27 MED ORDER — NITROGLYCERIN 0.4 MG SL SUBL
SUBLINGUAL_TABLET | SUBLINGUAL | Status: AC
  Filled 2019-04-27: qty 2

## 2019-04-27 NOTE — Research (Signed)
Cadfem Informed Consent    Patient Name: Travis Bryant    Subject met inclusion and exclusion criteria.  The informed consent form, study requirements and expectations were reviewed with the subject and questions and concerns were addressed prior to the signing of the consent form.  The subject verbalized understanding of the trail requirements.  The subject agreed to participate in the CADFEM trial and signed the informed consent.  The informed consent was obtained prior to performance of any protocol-specific procedures for the subject.  A copy of the signed informed consent was given to the subject and a copy was placed in the subject's medical record.   Neva Seat

## 2019-04-28 NOTE — Progress Notes (Signed)
Chief Complaint  Patient presents with   Follow-up    Chest pain   History of Present Illness: 58 yo male with history of HTN, asthma, remote tobacco abuse, enlarged prostate here today for cardiac follow up. I saw him as a new patient 10/12/14 for evaluation of chest pain and SOB. Prior to his visit in 2016 he described chest pain and dyspnea at rest. He had no exertional chest pain. Exercise stress test in 2016 showed no ischemic EKG changes. Echo 10/17/14 with normal LV size and function, no significant valve disease. He was seen in the ED 12/06/15 for chest pain. He ruled out for MI. Chest pain relieved with a GI cocktail. He had recurrent dyspnea in May of 2018 after a flight from [place]. CTA May 2018 negative for PE. Echo May 2018 with normal LV systolic function. I saw him 04/08/19 and he complained of exertional chest pain and pressure with associated dyspnea. 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 Both arteries are originating from a wide stem with ostia adjacent to each other. The RCA gives rise to the Circumflex, PDA and PLA. The Circumflex originates shortly after RCA origin and runs posteriorly between aortic root and left atrium and then continues in it's normal course. The Left anterior descending runs anteriorly in between aortic root and pulmonary artery.  He is here today for follow up. He continues to have exertional chest pain and upper back pain. He has some dyspnea but feels that this is due to his asthma. The patient denies any palpitations, lower extremity edema, orthopnea, PND, dizziness, near syncope or syncope.   Primary Care Physician: Jonny RuizLlibre, Giovanni, MD  Past Medical History:  Diagnosis Date   Asthma    Benign enlargement of prostate    Bladder disease    Environmental allergies    Hematuria    History of closed head injury 1966   Involving the left brain   History of  headache    HIV exposure    HTN (hypertension)    Memory change 01/25/2013   Restless legs syndrome (RLS) 01/14/2014    Past Surgical History:  Procedure Laterality Date   ABDOMINAL SURGERY     BRAIN SURGERY      Current Outpatient Medications  Medication Sig Dispense Refill   acetaminophen (TYLENOL) 325 MG tablet Take 650 mg by mouth every 6 (six) hours as needed (for pain).      albuterol (VENTOLIN HFA) 108 (90 Base) MCG/ACT inhaler INHALE 2 PUFFS INTO THE LUNGS EVERY 6 HOURS AS NEEDED FOR WHEEZING OR SHORTNESS OF BREATH 54 g 0   amLODipine (NORVASC) 5 MG tablet TAKE 1 TABLET BY MOUTH EVERY DAY 90 tablet 3   DESCOVY 200-25 MG tablet Take 1 tablet by mouth daily.     famotidine (PEPCID) 20 MG tablet Take 1 tablet (20 mg total) by mouth 2 (two) times daily. 60 tablet 5   fluticasone (FLOVENT HFA) 220 MCG/ACT inhaler Inhale 3 puffs into the lungs daily as needed (2 PUFFS TWICE DAILY FOR TWO WEEKS 03/26/2019-04/09/2019). 1 Inhaler 5   ipratropium (ATROVENT) 0.03 % nasal spray Place 2 sprays into both nostrils 2 (two) times daily. 30 mL 0   levocetirizine (XYZAL) 5 MG tablet Take 1 tablet (5 mg total) by mouth every evening. 30 tablet 5   metoprolol tartrate (LOPRESSOR) 100 MG tablet Take one tablet by mouth 2 hours prior to  your CT 1 tablet 0   tadalafil (CIALIS) 5 MG tablet Take 5 mg by mouth.     tamsulosin (FLOMAX) 0.4 MG CAPS capsule take 2 tabs by mouth daily     No current facility-administered medications for this visit.     No Known Allergies  Social History   Socioeconomic History   Marital status: Single    Spouse name: Not on file   Number of children: 0   Years of education: college   Highest education level: Not on file  Occupational History   Occupation: Teacher-part time    Employer: [employer] resource strain: Not on file   Food insecurity    Worry: Not on file    Inability: Not on file    Transportation needs    Medical: Not on file    Non-medical: Not on file  Tobacco Use   Smoking status: Former Smoker    Packs/day: 0.10    Years: 20.00    Pack years: 2.00    Types: Cigarettes    Quit date: 10/11/2012    Years since quitting: 6.5   Smokeless tobacco: Never Used   Tobacco comment: social smoker  Substance and Sexual Activity   Alcohol use: Yes    Alcohol/week: 0.0 standard drinks    Comment: socially   Drug use: No   Sexual activity: Not on file  Lifestyle   Physical activity    Days per week: Not on file    Minutes per session: Not on file   Stress: Not on file  Relationships   Social connections    Talks on phone: Not on file    Gets together: Not on file    Attends religious service: Not on file    Active member of club or organization: Not on file    Attends meetings of clubs or organizations: Not on file    Relationship status: Not on file   Intimate partner violence    Fear of current or ex partner: Not on file    Emotionally abused: Not on file    Physically abused: Not on file    Forced sexual activity: Not on file  Other Topics Concern   Not on file  Social History Narrative   Patient is left handed.   Patient drinks 3 glasses caffeine daily   Two Masters degrees    Family History  Problem Relation Age of Onset   Lupus Mother    Arthritis/Rheumatoid Mother    Hypertension Father    Heart disease Father    Sarcoidosis Sister    Cancer Maternal Grandmother    Heart disease Maternal Grandfather    Cancer - Prostate Paternal Grandfather    Allergic rhinitis Neg Hx    Angioedema Neg Hx    Asthma Neg Hx    Eczema Neg Hx    Immunodeficiency Neg Hx    Urticaria Neg Hx     Review of Systems:  As stated in the HPI and otherwise negative.   BP 136/74    Pulse (!) 101    Ht 5\' 10"  (1.778 m)    Wt 156 lb (70.8 kg)    BMI 22.38 kg/m   Physical Examination:  General: Well developed, well nourished, NAD  HEENT: OP  clear, mucus membranes moist  SKIN: warm, dry. No rashes. Neuro: No focal deficits  Musculoskeletal: Muscle strength 5/5 all ext  Psychiatric: Mood and affect normal  Neck: No JVD, no  carotid bruits, no thyromegaly, no lymphadenopathy.  Lungs:Clear bilaterally, no wheezes, rhonci, crackles Cardiovascular: Regular rate and rhythm. No murmurs, gallops or rubs. Abdomen:Soft. Bowel sounds present. Non-tender.  Extremities: No lower extremity edema. Pulses are 2 + in the bilateral DP/PT.  Echo May 2018: Left ventricle: The cavity size was normal. Systolic function was normal. The estimated ejection fraction was in the range of 55% to 60%. Wall motion was normal; there were no regional wall motion abnormalities. Doppler parameters are consistent with abnormal left ventricular relaxation (grade 1 diastolic dysfunction). There was no evidence of elevated ventricular filling pressure by Doppler parameters. - Aortic valve: Trileaflet; normal thickness leaflets. There was trivial regurgitation. - Aortic root: The aortic root was normal in size. - Mitral valve: Structurally normal valve. There was mild regurgitation. - Left atrium: The atrium was normal in size. - Right ventricle: The cavity size was normal. Wall thickness was normal. Systolic function was normal. - Right atrium: The atrium was normal in size. - Tricuspid valve: There was mild regurgitation. - Pulmonary arteries: Systolic pressure was within the normal range. - Inferior vena cava: The vessel was normal in size. - Pericardium, extracardiac: There was no pericardial effusion.  EKG:  EKG is not ordered today. The ekg ordered today demonstrates   Recent Labs: 04/08/2019: BUN 12; Creatinine, Ser 1.30; Potassium 4.1; Sodium 140   Lipid Panel No results found for: CHOL, TRIG, HDL, CHOLHDL, VLDL, LDLCALC, LDLDIRECT   Wt Readings from Last 3 Encounters:  04/29/19 156 lb (70.8 kg)  04/08/19 154 lb 1.9 oz  (69.9 kg)  03/17/19 155 lb 12.8 oz (70.7 kg)     Other studies Reviewed: Additional studies/ records that were reviewed today include: . Review of the above records demonstrates:    Assessment and Plan:   1. 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 There appears to be systolic compression of the LAD. He is describing exertional chest pain and pressure. I think he may need bypass of the LAD. I will arrange a right and left heart catheterization at [facility] on 05/06/19  I have reviewed the risks, indications, and alternatives to cardiac catheterization, possible angioplasty, and stenting with the patient. Risks include but are not limited to bleeding, infection, vascular injury, stroke, myocardial infection, arrhythmia, kidney injury, radiation-related injury in the case of prolonged fluoroscopy use, emergency cardiac surgery, and death. The patient understands the risks of serious complication is 1-2 in 1000 with diagnostic cardiac cath and 1-2% or less with angioplasty/stenting. I will also arrange an echo. I will discuss his case with our multi-disciplinary heart team including our CT surgeons next week.   2. 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   Current medicines are reviewed at length with the patient today.  The patient does not have concerns regarding medicines.  The following changes have been made:  no change  Labs/ tests ordered today include:   Orders Placed This Encounter  Procedures   CBC   Basic metabolic panel   ECHOCARDIOGRAM COMPLETE   Disposition:   FU with me in one year.   Signed, Verne Carrow, MD 04/29/2019 9:41 AM    [facility] [address] [place], [place], [place]  [address] Phone: [phone]; Fax: [phone]

## 2019-04-29 ENCOUNTER — Encounter: Payer: Self-pay | Admitting: *Deleted

## 2019-04-29 ENCOUNTER — Ambulatory Visit: Payer: Medicare PPO | Admitting: Cardiovascular Disease

## 2019-04-29 ENCOUNTER — Ambulatory Visit (INDEPENDENT_AMBULATORY_CARE_PROVIDER_SITE_OTHER): Payer: Medicare PPO | Admitting: Cardiovascular Disease

## 2019-04-29 ENCOUNTER — Other Ambulatory Visit: Payer: Self-pay

## 2019-04-29 VITALS — BP 136/74 | HR 101 | Ht 70.0 in | Wt 156.0 lb

## 2019-04-29 DIAGNOSIS — Q245 Malformation of coronary vessels: Secondary | ICD-10-CM

## 2019-04-29 LAB — BASIC METABOLIC PANEL
BUN/Creatinine Ratio: 13 (ref 9–20)
BUN: 16 mg/dL (ref 6–24)
CO2: 24 mmol/L (ref 20–29)
Calcium: 9.5 mg/dL (ref 8.7–10.2)
Chloride: 102 mmol/L (ref 96–106)
Creatinine, Ser: 1.24 mg/dL (ref 0.76–1.27)
GFR calc Af Amer: 73 mL/min/{1.73_m2} (ref >59)
GFR calc non Af Amer: 63 mL/min/{1.73_m2} (ref >59)
Glucose: 106 mg/dL — ABNORMAL HIGH (ref 65–99)
Potassium: 4.4 mmol/L (ref 3.5–5.2)
Sodium: 139 mmol/L (ref 134–144)

## 2019-04-29 LAB — CBC
Hematocrit: 39.6 % (ref 37.5–51.0)
Hemoglobin: 13.7 g/dL (ref 13.0–17.7)
MCH: 32.1 pg (ref 26.6–33.0)
MCHC: 34.6 g/dL (ref 31.5–35.7)
MCV: 93 fL (ref 79–97)
Platelets: 223 10*3/uL (ref 150–450)
RBC: 4.27 x10E6/uL (ref 4.14–5.80)
RDW: 13.6 % (ref 11.6–15.4)
WBC: 4.1 10*3/uL (ref 3.4–10.8)

## 2019-04-29 NOTE — Patient Instructions (Signed)
Medication Instructions:  No changes If you need a refill on your cardiac medications before your next appointment, please call your pharmacy.   Lab work: Today: cbc, bmet If you have labs (blood work) drawn today and your tests are completely normal, you will receive your results only by: Marland Kitchen MyChart Message (if you have MyChart) OR . A paper copy in the mail If you have any lab test that is abnormal or we need to change your treatment, we will call you to review the results.  Testing/Procedures: Your physician has requested that you have a cardiac catheterization. Cardiac catheterization is used to diagnose and/or treat various heart conditions. Doctors may recommend this procedure for a number of different reasons. The most common reason is to evaluate chest pain. Chest pain can be a symptom of coronary artery disease (CAD), and cardiac catheterization can show whether plaque is narrowing or blocking your heart's arteries. This procedure is also used to evaluate the valves, as well as measure the blood flow and oxygen levels in different parts of your heart. For further information please visit [email or web address]. Please follow instruction sheet, as given.  Your physician has requested that you have an echocardiogram. Echocardiography is a painless test that uses sound waves to create images of your heart. It provides your doctor with information about the size and shape of your heart and how well your heart's chambers and valves are working. This procedure takes approximately one hour. There are no restrictions for this procedure.   Follow-Up: At [facility], you and your health needs are our priority.  As part of our continuing mission to provide you with exceptional heart care, we have created designated Provider Care Teams.  These Care Teams include your primary Cardiologist (physician) and Advanced Practice Providers (APPs -  Physician Assistants and Nurse Practitioners) who all work  together to provide you with the care you need, when you need it. You will need a follow up appointment in 6 months.  Please call our office 2 months in advance to schedule this appointment.  You may see Lauree Chandler, MD or one of the following Advanced Practice Providers on your designated Care Team:   Helen, PA-C Melina Copa, PA-C . Ermalinda Barrios, PA-C  Any Other Special Instructions Will Be Listed Below (If Applicable).

## 2019-04-30 ENCOUNTER — Telehealth: Payer: Self-pay | Admitting: Cardiovascular Disease

## 2019-04-30 NOTE — Telephone Encounter (Signed)
Spoke with patient who called to state that he may want to postpone his heart catheterization and seek a second opinion or manage his condition non-surgically. I advised him to give this additional thought and call back Monday to report his decision. I advised that the Covid screening will also need to be cancelled. I spent several minutes explaining to him the documented need for the procedure and possible surgical intervention and Dr. Camillia Herter experience. Patient states he is not ready to proceed at this point and would like to go ahead and cancel the Covid screening appointment and the cardiac cath. I advised patient to call back with questions or concerns or to be rescheduled. He verbalized understanding and agreement and thanked me for the call.  [facility] Cath lab has been notified.

## 2019-04-30 NOTE — Telephone Encounter (Signed)
New Message:    Patient would like for some to call him concering his up coming appt. Patient would like to cancel his Cath. Please call patient.

## 2019-05-01 ENCOUNTER — Other Ambulatory Visit (HOSPITAL_COMMUNITY): Payer: Medicare PPO

## 2019-05-03 ENCOUNTER — Other Ambulatory Visit (HOSPITAL_COMMUNITY): Payer: Medicare PPO

## 2019-05-03 DIAGNOSIS — R072 Precordial pain: Secondary | ICD-10-CM

## 2019-05-03 NOTE — Telephone Encounter (Signed)
OK. Thanks. Travis Bryant

## 2019-05-06 ENCOUNTER — Ambulatory Visit (HOSPITAL_COMMUNITY): Admit: 2019-05-06 | Payer: Medicare PPO | Admitting: Cardiovascular Disease

## 2019-05-06 ENCOUNTER — Encounter (HOSPITAL_COMMUNITY): Payer: Medicare PPO

## 2019-05-06 SURGERY — RIGHT/LEFT HEART CATH AND CORONARY ANGIOGRAPHY
Anesthesia: LOCAL

## 2019-05-12 ENCOUNTER — Other Ambulatory Visit (HOSPITAL_COMMUNITY): Payer: Medicare PPO

## 2019-05-26 ENCOUNTER — Encounter: Payer: Self-pay | Admitting: Cardiology

## 2019-05-26 ENCOUNTER — Other Ambulatory Visit: Payer: Self-pay

## 2019-05-26 ENCOUNTER — Ambulatory Visit: Payer: Medicare PPO | Admitting: Cardiology

## 2019-05-26 VITALS — BP 149/92 | HR 112 | Temp 96.4°F | Ht 70.0 in | Wt 159.2 lb

## 2019-05-26 DIAGNOSIS — Z8249 Family history of ischemic heart disease and other diseases of the circulatory system: Secondary | ICD-10-CM

## 2019-05-26 DIAGNOSIS — I10 Essential (primary) hypertension: Secondary | ICD-10-CM | POA: Diagnosis not present

## 2019-05-26 DIAGNOSIS — R0609 Other forms of dyspnea: Secondary | ICD-10-CM

## 2019-05-26 DIAGNOSIS — Q245 Malformation of coronary vessels: Secondary | ICD-10-CM

## 2019-05-26 DIAGNOSIS — R0600 Dyspnea, unspecified: Secondary | ICD-10-CM

## 2019-05-26 DIAGNOSIS — I209 Angina pectoris, unspecified: Secondary | ICD-10-CM

## 2019-05-26 MED ORDER — METOPROLOL SUCCINATE ER 50 MG PO TB24: 50.0000 mg | ORAL_TABLET | Freq: Every day | ORAL | 3 refills | Status: DC

## 2019-05-26 NOTE — Progress Notes (Signed)
Primary Physician/Referring:  Patient, No Pcp Per  Patient ID: Travis Bryant, male    DOB: March 19, 1961, 58 y.o.   MRN: [id number]  Chief Complaint  Patient presents with   Chest Pain   New Patient (Initial Visit)   Anomalous Coronary   HPI:    Travis Bryant  is a 58 y.o.  African-American male with hypertension, bronchial asthma, remote tobacco use, who was evaluated by Dr. Angelena Form in September 2020, for chest pain, dyspnea on exertion.  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  RCA runs posteriorly between aortic root and left atrium in normal fashion and LAD runs anteriorly between aortic root and pulmonary artery.  Due to malignant course of the LAD, exertional chest pain and dyspnea, he was recommended CABG of the LAD with reimplantation and was scheduled for left heart catheterization on 05/06/2019.  He wanted a second opinion and hence presents to establish with me. Patient states that he has been fairly active, has had occasional episodes of chest tightness with sexual activity and also during exertional activity but overall he walked 2 miles just yesterday presently without any chest pain or dyspnea.  States that he occasionally gets shortness of breath especially when he has exposure to pollen and allergies.  No rest pain, no dizziness or syncope.  No hemoptysis.  No leg edema.  He does have family history of premature coronary artery disease, father had myocardial infarction in his early 59 years of age.  Past Medical History:  Diagnosis Date   Asthma    Benign enlargement of prostate    Bladder disease    Environmental allergies    Hematuria    History of closed head injury 1966   Involving the left brain   History of headache    HIV exposure    HTN (hypertension)    Memory change 01/25/2013   Restless legs syndrome (RLS) 01/14/2014   Past Surgical History:   Procedure Laterality Date   ABDOMINAL SURGERY     BRAIN SURGERY     Social History   Socioeconomic History   Marital status: Single    Spouse name: Not on file   Number of children: 0   Years of education: college   Highest education level: Not on file  Occupational History   Occupation: Teacher-part time    Employer: [employer]   Financial resource strain: Not on file   Food insecurity    Worry: Not on file    Inability: Not on file   Transportation needs    Medical: Not on file    Non-medical: Not on file  Tobacco Use   Smoking status: Former Smoker    Packs/day: 0.10    Years: 20.00    Pack years: 2.00    Types: Cigarettes    Quit date: 10/11/2012    Years since quitting: 6.6   Smokeless tobacco: Never Used   Tobacco comment: pt was social smoker smoked 1 a month  Substance and Sexual Activity   Alcohol use: Yes    Alcohol/week: 0.0 standard drinks    Comment: socially   Drug use: No   Sexual activity: Not on file  Lifestyle   Physical activity    Days per week: Not on file    Minutes per session: Not on file   Stress: Not on file  Relationships   Social connections  Talks on phone: Not on file    Gets together: Not on file    Attends religious service: Not on file    Active member of club or organization: Not on file    Attends meetings of clubs or organizations: Not on file    Relationship status: Not on file   Intimate partner violence    Fear of current or ex partner: Not on file    Emotionally abused: Not on file    Physically abused: Not on file    Forced sexual activity: Not on file  Other Topics Concern   Not on file  Social History Narrative   Patient is left handed.   Patient drinks 3 glasses caffeine daily   Two Masters degrees   ROS  Review of Systems  Constitution: Negative for chills, decreased appetite, malaise/fatigue and weight gain.  Cardiovascular: Positive for chest pain.  Negative for dyspnea on exertion, leg swelling and syncope.  Respiratory: Positive for shortness of breath (h/o asthma and occasional dyspnea on exertion).   Endocrine: Negative for cold intolerance.  Hematologic/Lymphatic: Does not bruise/bleed easily.  Musculoskeletal: Negative for joint swelling.  Gastrointestinal: Negative for abdominal pain, anorexia, change in bowel habit, hematochezia and melena.  Neurological: Negative for headaches and light-headedness.  Psychiatric/Behavioral: Negative for depression and substance abuse.  All other systems reviewed and are negative.  Objective   Vitals with BMI 05/26/2019 04/29/2019 04/27/2019  Height '5\' 10"'  '5\' 10"'  -  Weight 159 lbs 3 oz 156 lbs -  BMI 63.14 97.02 -  Systolic 637 858 850  Diastolic 92 74 80  Pulse 277 101 -    Blood pressure (!) 149/92, pulse (!) 112, temperature (!) 96.4 F (35.8 C), height '5\' 10"'  (1.778 m), weight 159 lb 3.2 oz (72.2 kg), SpO2 96 %. Body mass index is 22.84 kg/m.   Physical Exam  HENT:  Head: Atraumatic.  Eyes: Conjunctivae are normal.  Neck: Neck supple. No JVD present. No thyromegaly present.  Cardiovascular: Normal rate, regular rhythm, normal heart sounds and intact distal pulses. Exam reveals no gallop.  No murmur heard. No leg edema, no JVD.  Pulmonary/Chest: Effort normal and breath sounds normal.  Abdominal: Soft. Bowel sounds are normal.  Musculoskeletal: Normal range of motion.  Neurological: He is alert.  Skin: Skin is warm and dry.  Psychiatric: He has a normal mood and affect.   Radiology: No results found.  Laboratory examination:   Recent Labs    04/08/19 1207 04/29/19 0952  NA 140 139  K 4.1 4.4  CL 105 102  CO2 20 24  GLUCOSE 88 106*  BUN 12 16  CREATININE 1.30* 1.24  CALCIUM 9.0 9.5  GFRNONAA 60 63  GFRAA 69 73   CMP Latest Ref Rng & Units 04/29/2019 04/08/2019 12/16/2016  Glucose 65 - 99 mg/dL 106(H) 88 100(H)  BUN 6 - 24 mg/dL '16 12 16  ' Creatinine 0.76 - 1.27  mg/dL 1.24 1.30(H) 1.28(H)  Sodium 134 - 144 mmol/L 139 140 137  Potassium 3.5 - 5.2 mmol/L 4.4 4.1 4.4  Chloride 96 - 106 mmol/L 102 105 104  CO2 20 - 29 mmol/L '24 20 27  ' Calcium 8.7 - 10.2 mg/dL 9.5 9.0 9.6  Total Protein 6.0 - 8.5 g/dL - - -  Total Bilirubin 0.0 - 1.2 mg/dL - - -  Alkaline Phos 39 - 117 IU/L - - -  AST 0 - 40 IU/L - - -  ALT 0 - 44 IU/L - - -  CBC Latest Ref Rng & Units 04/29/2019 03/21/2016 12/09/2015  WBC 3.4 - 10.8 x10E3/uL 4.1 3.9 5.6  Hemoglobin 13.0 - 17.7 g/dL 13.7 13.4 12.9(L)  Hematocrit 37.5 - 51.0 % 39.6 38.5 38.8(L)  Platelets 150 - 450 x10E3/uL 223 197 216   Lipid Panel  No results found for: CHOL, TRIG, HDL, CHOLHDL, VLDL, LDLCALC, LDLDIRECT HEMOGLOBIN A1C No results found for: HGBA1C, MPG TSH No results for input(s): TSH in the last 8760 hours. Medications and allergies  No Known Allergies   Prior to Admission medications   Medication Sig Start Date End Date Taking? Authorizing Provider  acetaminophen (TYLENOL) 325 MG tablet Take 650 mg by mouth every 6 (six) hours as needed (for pain).    Yes [provider]  albuterol (VENTOLIN HFA) 108 (90 Base) MCG/ACT inhaler INHALE 2 PUFFS INTO THE LUNGS EVERY 6 HOURS AS NEEDED FOR WHEEZING OR SHORTNESS OF BREATH 03/26/19  Yes Ambs, Kathrine Cords, FNP  amLODipine (NORVASC) 5 MG tablet TAKE 1 TABLET BY MOUTH EVERY DAY 10/21/18  Yes Burnell Blanks, MD  DESCOVY 200-25 MG tablet Take 1 tablet by mouth daily. 03/13/19  Yes [provider]  famotidine (PEPCID) 20 MG tablet Take 1 tablet (20 mg total) by mouth 2 (two) times daily. 03/26/19  Yes Ambs, Kathrine Cords, FNP  fluticasone (FLOVENT HFA) 220 MCG/ACT inhaler Inhale 3 puffs into the lungs daily as needed (2 PUFFS TWICE DAILY FOR TWO WEEKS 03/26/2019-04/09/2019). 03/26/19  Yes Ambs, Kathrine Cords, FNP  ipratropium (ATROVENT) 0.03 % nasal spray Place 2 sprays into both nostrils 2 (two) times daily. 03/25/18  Yes Robyn Haber, MD  levocetirizine (XYZAL) 5 MG  tablet Take 1 tablet (5 mg total) by mouth every evening. 03/26/19  Yes Ambs, Kathrine Cords, FNP  tadalafil (CIALIS) 5 MG tablet Take 5 mg by mouth. 04/15/17 11/30/27 Yes [provider]  tamsulosin (FLOMAX) 0.4 MG CAPS capsule take 2 tabs by mouth daily 08/21/16  Yes [provider]  metoprolol tartrate (LOPRESSOR) 100 MG tablet Take one tablet by mouth 2 hours prior to your CT Patient not taking: Reported on 05/26/2019 04/08/19   Burnell Blanks, MD     Current Outpatient Medications  Medication Instructions   acetaminophen (TYLENOL) 650 mg, Oral, Every 6 hours PRN   albuterol (VENTOLIN HFA) 108 (90 Base) MCG/ACT inhaler INHALE 2 PUFFS INTO THE LUNGS EVERY 6 HOURS AS NEEDED FOR WHEEZING OR SHORTNESS OF BREATH   amLODipine (NORVASC) 5 MG tablet TAKE 1 TABLET BY MOUTH EVERY DAY   DESCOVY 200-25 MG tablet 1 tablet, Oral, Daily   famotidine (PEPCID) 20 mg, Oral, 2 times daily   fluticasone (FLOVENT HFA) 220 MCG/ACT inhaler 3 puffs, Inhalation, Daily PRN   ipratropium (ATROVENT) 0.03 % nasal spray 2 sprays, Each Nare, 2 times daily   levocetirizine (XYZAL) 5 mg, Oral, Every evening   metoprolol succinate (TOPROL-XL) 50 mg, Oral, Daily, Take with or immediately following a meal.   metoprolol tartrate (LOPRESSOR) 100 MG tablet Take one tablet by mouth 2 hours prior to your CT   tadalafil (CIALIS) 5 mg, Oral   tamsulosin (FLOMAX) 0.4 MG CAPS capsule take 2 tabs by mouth daily    Cardiac Studies:   Echocardiogram 12/30/2016:  Normal LVEF at 55-60% no wall motion normality.  Grade 1 diastolic dysfunction. Trace aortic regurgitation. Normal mitral valve, mild MR.  Normal tricuspid valve, mild TR.  No evidence of pulmonary infection.  Cardiac CTA and FFR analysis 04/22/2019:  1. Coronary calcium score  of 12. This was 30 percentile for age and sex matched control.  2. 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 Both arteries are originating from a wide stem with ostia adjacent to each other.  RCA gives rise to LCX artery, PDA and PLA. LCX originates shortly after RCA origin and runs posteriorly between aortic root and left atrium and then continues in it's normal course.  Left coronary artery runs anteriorly in between aortic root and pulmonary artery.  3. There is minimal CAD in the proximal RCA and LAD. However, with anomalous LAD from right coronary sinus with anterior course, slit like origin and evidence for dynamic compression in systole a surgical consult should be considered. CT FFR will be submitted (not currently approved for this indication).  4. Moderately dilated pulmonary artery measuring 37 mm suggestive of pulmonary hypertension.   Electronically Signed   By: Ena Dawley   On: 04/27/2019 21:02  FFRct analysis was performed on the original cardiac CT angiogram dataset. Diagrammatic representation of the FFRct analysis is provided in a separate PDF document in PACS. This dictation was created using the PDF document and an interactive 3D model of the results. 3D model is not available in the EMR/PACS. Normal FFR range is >0.80.  1. LAD: Ostial: 0.99, proximal: 0.86, mid: 0.81, distal: 0.79. 2. D1: 0.88. 3. LCX: Proximal: 0.91, mid: 0.83, distal: 0.76.  4. RCA: 0.96.  IMPRESSION: 1. CT FFR analysis showed hemodynamically significant stenosis in the LAD and LCX arteries most probably secondary to anomalous course and dynamic compression in systole.  Electronically Signed    By: Ena Dawley   On: 05/03/2019 21:39  Assessment     ICD-10-CM   1. 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 Angina pectoris (HCC)  I20.9 Lipid Panel With LDL/HDL Ratio    Lipoprotein A (LPA)    TSH    metoprolol succinate (TOPROL-XL) 50 MG 24 hr tablet    PCV ECHOCARDIOGRAM COMPLETE  3. Dyspnea on exertion  R06.00 CBC    PCV ECHOCARDIOGRAM COMPLETE  4.  Family history of premature CAD  Z70.49    Father MI at age 40 Y.  3. Benign essential HTN  I10 CMP14+EGFR    metoprolol succinate (TOPROL-XL) 50 MG 24 hr tablet    EKG 05/26/2019: Normal sinus rhythm at rate of 96 bpm, normal axis, no evidence of ischemia, otherwise normal EKG.    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 Cardiac risk factors include hypertension, family history of premature coronary disease and died at age 61.  On further questioning, he has been exercising on a very regular basis, although he has had few episodes of exertional chest pain, mostly has been asymptomatic.  I reviewed the CTA, I'll also try to review it personally and see if I can make final recommendation regarding whether need for surgery have not.  One option would be to consider a routine treadmill exercise stress test to evaluate for both inducible ischemia and arrhythmias.  Dyspnea on exertion needs to be evaluated further, his PA was slightly enlarged, I'll obtain an echocardiogram.  He also has history of bronchial asthma but this seems to be under good control.  He was tachycardic today, both for angina and for hypertension  and tachycardia, I have started him on metoprolol XL 50 mg daily.  I'll obtain routine labs and see him back in 4-6 weeks for follow-up.  He needs a PCP to be established.  This was a 40 minute face to face encounter discussion about his symptoms, condition and anatomy and preventive measures.   Adrian Prows, MD, [facility] 05/26/2019, 8:53 PM [facility] [place] Pager: [phone] Office: [phone] If no answer Cell [phone]

## 2019-05-28 ENCOUNTER — Ambulatory Visit (INDEPENDENT_AMBULATORY_CARE_PROVIDER_SITE_OTHER): Payer: Medicare PPO | Admitting: Family Medicine

## 2019-05-28 ENCOUNTER — Other Ambulatory Visit: Payer: Self-pay

## 2019-05-28 ENCOUNTER — Encounter: Payer: Self-pay | Admitting: Family Medicine

## 2019-05-28 VITALS — BP 138/70 | HR 87 | Temp 97.7°F | Resp 16 | Ht 69.0 in | Wt 158.0 lb

## 2019-05-28 DIAGNOSIS — K219 Gastro-esophageal reflux disease without esophagitis: Secondary | ICD-10-CM

## 2019-05-28 DIAGNOSIS — J3089 Other allergic rhinitis: Secondary | ICD-10-CM | POA: Diagnosis not present

## 2019-05-28 DIAGNOSIS — J4520 Mild intermittent asthma, uncomplicated: Secondary | ICD-10-CM | POA: Diagnosis not present

## 2019-05-28 MED ORDER — FLUTICASONE PROPIONATE 50 MCG/ACT NA SUSP: 1.0000 | Freq: Every day | NASAL | 5 refills | Status: DC | PRN

## 2019-05-28 MED ORDER — LEVALBUTEROL TARTRATE 45 MCG/ACT IN AERO: 2.0000 | INHALATION_SPRAY | RESPIRATORY_TRACT | 5 refills | Status: DC | PRN

## 2019-05-28 MED ORDER — LEVOCETIRIZINE DIHYDROCHLORIDE 5 MG PO TABS: 5.0000 mg | ORAL_TABLET | Freq: Every evening | ORAL | 5 refills | Status: DC

## 2019-05-28 MED ORDER — FLUTICASONE PROPIONATE HFA 220 MCG/ACT IN AERO: 2.0000 | INHALATION_SPRAY | Freq: Two times a day (BID) | RESPIRATORY_TRACT | 5 refills | Status: DC

## 2019-05-28 MED ORDER — FAMOTIDINE 20 MG PO TABS: 20.0000 mg | ORAL_TABLET | Freq: Two times a day (BID) | ORAL | 5 refills | Status: DC

## 2019-05-28 NOTE — Patient Instructions (Addendum)
Asthma For asthma flares, begin Flovent 220-2 puffs twice a day with a spacer for 2 weeks or until you are cough and wheeze free Continue levalbuterol (Xopenex) 2 puffs every 4 hours as needed for cough or wheeze. Do not use albuterol at this time  Allergic rhinitis Continue Flonase 1-2 sprays in each nostril once a day as needed for a stuffy nose Continue Xyzal once a day as needed for a runny nose Consider saline nasal rinses as needed for nasal symptoms. Use this before any medicated nasal sprays for best result Consider Mucinex [phone] mg twice a day as needed for thick post nasal drainage  Reflux Continue dietary and lifestyle modifications as listed below Continue famotidine 20 mg twice a day as needed for control of reflux  Call the clinic if this treatment plan is not working well for you  Follow up in 5 months or sooner if needed

## 2019-05-28 NOTE — Addendum Note (Signed)
Addended by: Neomia Dear on: 05/28/2019 02:45 PM   Modules accepted: Orders

## 2019-05-28 NOTE — Progress Notes (Signed)
[address] [place] [address] Dept: [phone]  FOLLOW UP NOTE  Patient ID: Travis Bryant, male    DOB: [birth year]  Age: 58 y.o. MRN: [id number] Date of Office Visit: 05/28/2019  Assessment  Chief Complaint: Asthma and Allergic Rhinitis   HPI Travis Bryant is a 58 year old male who presents to the clinic for a follow up visit. He was last seen in the clinic on 03/26/2019 for evaluation of asthma, allergic rhinitis, and reflux.  At today's visit, he reports his asthma has been well controlled with no shortness of breath or wheezing with activity or rest.  He reports experiencing a dry intermittent cough especially after eating or while talking.  He is currently using Flovent 220 and albuterol interchangeably and reports using 1 of these about once a week.  He does report frequent anxiety which is increased when he is wearing the mask.  Allergic rhinitis is reported as moderately well controlled with nasal congestion is the main symptom.  He takes Xyzal as needed and is not using Flonase or saline nasal rinse.  He reports that anything in his nose feels uncomfortable.  He reports reflux as well controlled with no heartburn or vomiting.   He occasionally takes famotidine as needed.  He does report that he is seeing a new cardiologist for intermittent chest pain that began in September 2020.  His current medications are listed in the chart.  Drug Allergies:  No Known Allergies  Physical Exam: BP 138/70 (BP Location: Left Arm, Patient Position: Sitting, Cuff Size: Normal)   Pulse 87   Temp 97.7 F (36.5 C) (Temporal)   Resp 16   Ht 5\' 9"  (1.753 m)   Wt 158 lb (71.7 kg)   SpO2 98%   BMI 23.33 kg/m    Physical Exam Vitals signs reviewed.  Constitutional:      Appearance: Normal appearance.  HENT:     Head: Normocephalic and atraumatic.     Right Ear: Tympanic membrane normal.     Left Ear: Tympanic membrane normal.     Nose:     Comments: Bilateral nares slightly  erythematous and edematous with clear nasal drainage noted.  Pharynx slightly erythematous with no exudate.  Ears normal.  Eyes normal. Eyes:     Conjunctiva/sclera: Conjunctivae normal.  Neck:     Musculoskeletal: Normal range of motion and neck supple.  Cardiovascular:     Rate and Rhythm: Normal rate and regular rhythm.     Heart sounds: Normal heart sounds. No murmur.  Pulmonary:     Effort: Pulmonary effort is normal.     Breath sounds: Normal breath sounds.     Comments: Lungs clear to auscultation Musculoskeletal: Normal range of motion.  Skin:    General: Skin is warm and dry.  Neurological:     Mental Status: He is alert and oriented to person, place, and time.  Psychiatric:        Mood and Affect: Mood normal.        Behavior: Behavior normal.        Thought Content: Thought content normal.        Judgment: Judgment normal.     Diagnostics: FVC 3.91, FEV1 3.23.  Predicted FVC 3.87, predicted FEV1 3.02.  Spirometry indicates normal ventilatory function.  Assessment and Plan: 1. Mild intermittent asthma without complication   2. Seasonal and perennial allergic rhinitis   3. Gastroesophageal reflux disease, unspecified whether esophagitis present     Meds  ordered this encounter  Medications  . levocetirizine (XYZAL) 5 MG tablet    Sig: Take 1 tablet (5 mg total) by mouth every evening.    Dispense:  30 tablet    Refill:  5  . famotidine (PEPCID) 20 MG tablet    Sig: Take 1 tablet (20 mg total) by mouth 2 (two) times daily.    Dispense:  60 tablet    Refill:  5  . fluticasone (FLOVENT HFA) 220 MCG/ACT inhaler    Sig: Inhale 2 puffs into the lungs 2 (two) times daily.    Dispense:  12 g    Refill:  5  . fluticasone (FLONASE) 50 MCG/ACT nasal spray    Sig: Place 1-2 sprays into both nostrils daily as needed.    Dispense:  16 g    Refill:  5  . levalbuterol (XOPENEX HFA) 45 MCG/ACT inhaler    Sig: Inhale 2 puffs into the lungs every 4 (four) hours as needed.     Dispense:  1 Inhaler    Refill:  5    Patient Instructions  Asthma For asthma flares, begin Flovent 220-2 puffs twice a day with a spacer for 2 weeks or until you are cough and wheeze free Continue levalbuterol (Xopenex) 2 puffs every 4 hours as needed for cough or wheeze. Do not use albuterol at this time  Allergic rhinitis Continue Flonase 1-2 sprays in each nostril once a day as needed for a stuffy nose Continue Xyzal once a day as needed for a runny nose Consider saline nasal rinses as needed for nasal symptoms. Use this before any medicated nasal sprays for best result Consider Mucinex [phone] mg twice a day as needed for thick post nasal drainage  Reflux Continue dietary and lifestyle modifications as listed below Continue famotidine 20 mg twice a day as needed for control of reflux  Call the clinic if this treatment plan is not working well for you  Follow up in 5 months or sooner if needed   Return in about 6 months (around 11/26/2019), or if symptoms worsen or fail to improve.    Thank you for the opportunity to care for this patient.  Please do not hesitate to contact me with questions.  Thermon Leyland, FNP [facility]

## 2019-05-30 LAB — LIPOPROTEIN A (LPA): Lipoprotein (a): 41.1 nmol/L (ref <75.0)

## 2019-05-30 LAB — CMP14+EGFR
ALT: 14 IU/L (ref 0–44)
AST: 18 IU/L (ref 0–40)
Albumin/Globulin Ratio: 1.3 (ref 1.2–2.2)
Albumin: 4.5 g/dL (ref 3.8–4.9)
Alkaline Phosphatase: 73 IU/L (ref 39–117)
BUN/Creatinine Ratio: 13 (ref 9–20)
BUN: 17 mg/dL (ref 6–24)
Bilirubin Total: 0.6 mg/dL (ref 0.0–1.2)
CO2: 24 mmol/L (ref 20–29)
Calcium: 9.5 mg/dL (ref 8.7–10.2)
Chloride: 103 mmol/L (ref 96–106)
Creatinine, Ser: 1.29 mg/dL — ABNORMAL HIGH (ref 0.76–1.27)
GFR calc Af Amer: 70 mL/min/{1.73_m2} (ref >59)
GFR calc non Af Amer: 60 mL/min/{1.73_m2} (ref >59)
Globulin, Total: 3.4 g/dL (ref 1.5–4.5)
Glucose: 105 mg/dL — ABNORMAL HIGH (ref 65–99)
Potassium: 4.4 mmol/L (ref 3.5–5.2)
Sodium: 138 mmol/L (ref 134–144)
Total Protein: 7.9 g/dL (ref 6.0–8.5)

## 2019-05-30 LAB — CBC
Hematocrit: 39.1 % (ref 37.5–51.0)
Hemoglobin: 13.6 g/dL (ref 13.0–17.7)
MCH: 32.3 pg (ref 26.6–33.0)
MCHC: 34.8 g/dL (ref 31.5–35.7)
MCV: 93 fL (ref 79–97)
Platelets: 222 10*3/uL (ref 150–450)
RBC: 4.21 x10E6/uL (ref 4.14–5.80)
RDW: 13.8 % (ref 11.6–15.4)
WBC: 3.9 10*3/uL (ref 3.4–10.8)

## 2019-05-30 LAB — LIPID PANEL WITH LDL/HDL RATIO
Cholesterol, Total: 188 mg/dL (ref 100–199)
HDL: 58 mg/dL (ref >39)
LDL Chol Calc (NIH): 119 mg/dL — ABNORMAL HIGH (ref 0–99)
LDL/HDL Ratio: 2.1 ratio (ref 0.0–3.6)
Triglycerides: 57 mg/dL (ref 0–149)
VLDL Cholesterol Cal: 11 mg/dL (ref 5–40)

## 2019-05-30 LAB — TSH: TSH: 0.705 u[IU]/mL (ref 0.450–4.500)

## 2019-05-31 ENCOUNTER — Telehealth: Payer: Self-pay

## 2019-05-31 ENCOUNTER — Telehealth: Payer: Self-pay | Admitting: Cardiology

## 2019-05-31 DIAGNOSIS — R0609 Other forms of dyspnea: Secondary | ICD-10-CM

## 2019-05-31 DIAGNOSIS — Q245 Malformation of coronary vessels: Secondary | ICD-10-CM

## 2019-05-31 DIAGNOSIS — I209 Angina pectoris, unspecified: Secondary | ICD-10-CM

## 2019-05-31 NOTE — Telephone Encounter (Signed)
After review of the CTA by Dr. Kirk Ruths. [place], LAD does take malignant course and also there is a significant kink associated with this.  Concerns are as patient gets to be older, with the pulmonary artery and aorta dilating, there is a good chance outpatient developing significant ischemic symptoms from the LAD compression which has a malignant course arising from the right coronary cusp.  I discussed with the patient regarding this, advised him that elective surgery is probably the best way to go.  He prefers to wait on this, he would like to proceed with a stress test.  I will change his routine treadmill stress test to nuclear stress test to evaluate for ischemic burden, obviously if it is a high risk stress test by coronary ischemia by perfusion defect, we can guide him for further options including proceeding with surgery.

## 2019-05-31 NOTE — Telephone Encounter (Signed)
Prior authorization requested and approved for levalbuterol hfa inhaler. Faxed to pharmacy and sent to scan center.

## 2019-06-02 ENCOUNTER — Ambulatory Visit (INDEPENDENT_AMBULATORY_CARE_PROVIDER_SITE_OTHER): Payer: Medicare PPO

## 2019-06-02 ENCOUNTER — Other Ambulatory Visit: Payer: Self-pay

## 2019-06-02 DIAGNOSIS — R0609 Other forms of dyspnea: Secondary | ICD-10-CM

## 2019-06-02 DIAGNOSIS — R0600 Dyspnea, unspecified: Secondary | ICD-10-CM

## 2019-06-02 DIAGNOSIS — I209 Angina pectoris, unspecified: Secondary | ICD-10-CM | POA: Diagnosis not present

## 2019-06-21 ENCOUNTER — Other Ambulatory Visit: Payer: Self-pay

## 2019-06-21 ENCOUNTER — Ambulatory Visit (INDEPENDENT_AMBULATORY_CARE_PROVIDER_SITE_OTHER): Payer: Medicare PPO

## 2019-06-21 DIAGNOSIS — R0600 Dyspnea, unspecified: Secondary | ICD-10-CM | POA: Diagnosis not present

## 2019-06-21 DIAGNOSIS — Q245 Malformation of coronary vessels: Secondary | ICD-10-CM | POA: Diagnosis not present

## 2019-06-21 DIAGNOSIS — R0609 Other forms of dyspnea: Secondary | ICD-10-CM

## 2019-06-21 DIAGNOSIS — I209 Angina pectoris, unspecified: Secondary | ICD-10-CM | POA: Diagnosis not present

## 2019-06-29 ENCOUNTER — Encounter: Payer: Self-pay | Admitting: Cardiology

## 2019-06-29 ENCOUNTER — Ambulatory Visit: Payer: Medicare PPO | Admitting: Cardiology

## 2019-06-29 ENCOUNTER — Other Ambulatory Visit: Payer: Self-pay

## 2019-06-29 VITALS — BP 117/75 | HR 88 | Temp 97.6°F | Ht 70.0 in | Wt 154.0 lb

## 2019-06-29 DIAGNOSIS — E785 Hyperlipidemia, unspecified: Secondary | ICD-10-CM

## 2019-06-29 DIAGNOSIS — I209 Angina pectoris, unspecified: Secondary | ICD-10-CM | POA: Diagnosis not present

## 2019-06-29 DIAGNOSIS — I10 Essential (primary) hypertension: Secondary | ICD-10-CM

## 2019-06-29 DIAGNOSIS — Q245 Malformation of coronary vessels: Secondary | ICD-10-CM | POA: Diagnosis not present

## 2019-06-29 DIAGNOSIS — R0600 Dyspnea, unspecified: Secondary | ICD-10-CM | POA: Diagnosis not present

## 2019-06-29 DIAGNOSIS — R0609 Other forms of dyspnea: Secondary | ICD-10-CM

## 2019-06-29 MED ORDER — METOPROLOL SUCCINATE ER 50 MG PO TB24: 50.0000 mg | ORAL_TABLET | Freq: Every day | ORAL | 3 refills | Status: DC

## 2019-06-29 MED ORDER — ATORVASTATIN CALCIUM 10 MG PO TABS: 10.0000 mg | ORAL_TABLET | Freq: Every day | ORAL | 3 refills | Status: DC

## 2019-06-29 MED ORDER — AMLODIPINE BESYLATE 5 MG PO TABS: 5.0000 mg | ORAL_TABLET | Freq: Every day | ORAL | 3 refills | Status: DC

## 2019-06-29 NOTE — Progress Notes (Signed)
Primary Physician/Referring:  Patient, No Pcp Per  Patient ID: Travis Bryant, male    DOB: Feb 06, 1961, 58 y.o.   MRN: [id number]  Chief Complaint  Patient presents with  . Chest Pain  . Follow-up    4 weeks  . Results    labs   HPI:    Travis Bryant  is a 58 y.o.  African-American male with hypertension, bronchial asthma, remote tobacco use, presents for follow-up of chest pain, dyspnea on exertion.  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  RCA runs posteriorly between aortic root and left atrium in normal fashion and LAD runs anteriorly between aortic root and pulmonary artery.  Due to malignant course of the LAD, exertional chest pain and dyspnea, he was recommended CABG of the LAD with reimplantation and was scheduled for left heart catheterization on 05/06/2019.  Patient states that he has been fairly active, has had occasional episodes of chest tightness with sexual activity and also during exertional activity. No rest pain, no dizziness or syncope.  No hemoptysis.  No leg edema.  He does have family history of premature coronary artery disease, father had myocardial infarction in his early 59 years of age.  After discussions of his CTA and also giving him all the information regarding anomalous course of his LAD, he had opted for medical therapy.  I discussed his presentation with Dr. Marca Anconaalton McLean and we decided to do a nuclear stress test to evaluate both for ischemia and arrhythmias.  He underwent echo and nuclear stress test and presents for follow-up.  No new symptoms.  His sister is present.  Past Medical History:  Diagnosis Date  . Asthma   . Benign enlargement of prostate   . Bladder disease   . Environmental allergies   . Hematuria   . 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 History of headache   . HIV exposure   . HTN (hypertension)   .  Memory change 01/25/2013  . Restless legs syndrome (RLS) 01/14/2014   Past Surgical History:  Procedure Laterality Date  . ABDOMINAL SURGERY    . BRAIN SURGERY     Social History   Socioeconomic History  . Marital status: Single    Spouse name: Not on file  . Number of children: 0  . Years of education: college  . Highest education level: Not on file  Occupational History  . Occupation: Teacher-part time    Employer: [employer]  Social Needs  . Financial resource strain: Not on file  . Food insecurity    Worry: Not on file    Inability: Not on file  . Transportation needs    Medical: Not on file    Non-medical: Not on file  Tobacco Use  . Smoking status: Former Smoker    Packs/day: 0.10    Years: 20.00    Pack years: 2.00    Types: Cigarettes    Quit date: 10/11/2012    Years since quitting: 6.7  . Smokeless tobacco: Never Used  . Tobacco comment: pt was social smoker smoked 1 a month  Substance and Sexual Activity  . Alcohol use: Yes    Alcohol/week: 0.0 standard drinks    Comment: socially  . Drug use: No  . Sexual activity: Not on file  Lifestyle  . Physical activity    Days per week: Not  on file    Minutes per session: Not on file  . Stress: Not on file  Relationships  . Social Herbalist on phone: Not on file    Gets together: Not on file    Attends religious service: Not on file    Active member of club or organization: Not on file    Attends meetings of clubs or organizations: Not on file    Relationship status: Not on file  . Intimate partner violence    Fear of current or ex partner: Not on file    Emotionally abused: Not on file    Physically abused: Not on file    Forced sexual activity: Not on file  Other Topics Concern  . Not on file  Social History Narrative   Patient is left handed.   Patient drinks 3 glasses caffeine daily   Two Masters degrees   ROS  Review of Systems  Constitution: Negative for chills, decreased  appetite, malaise/fatigue and weight gain.  Cardiovascular: Positive for chest pain. Negative for dyspnea on exertion, leg swelling and syncope.  Respiratory: Positive for shortness of breath (h/o asthma and occasional dyspnea on exertion).   Endocrine: Negative for cold intolerance.  Hematologic/Lymphatic: Does not bruise/bleed easily.  Musculoskeletal: Negative for joint swelling.  Gastrointestinal: Negative for abdominal pain, anorexia, change in bowel habit, hematochezia and melena.  Neurological: Negative for headaches and light-headedness.  Psychiatric/Behavioral: Negative for depression and substance abuse.  All other systems reviewed and are negative.  Objective   Vitals with BMI 06/29/2019 05/28/2019 05/26/2019  Height 5\' 10"  5\' 9"  5\' 10"   Weight 154 lbs 158 lbs 159 lbs 3 oz  BMI 22.1 95.63 87.56  Systolic 433 295 188  Diastolic 75 70 92  Pulse 88 87 112    Blood pressure 117/75, pulse 88, temperature 97.6 F (36.4 C), height 5\' 10"  (1.778 m), weight 154 lb (69.9 kg), SpO2 99 %. Body mass index is 22.1 kg/m.   Physical Exam  HENT:  Head: Atraumatic.  Eyes: Conjunctivae are normal.  Neck: Neck supple. No JVD present. No thyromegaly present.  Cardiovascular: Normal rate, regular rhythm, normal heart sounds and intact distal pulses. Exam reveals no gallop.  No murmur heard. No leg edema, no JVD.  Pulmonary/Chest: Effort normal and breath sounds normal.  Abdominal: Soft. Bowel sounds are normal.  Musculoskeletal: Normal range of motion.  Neurological: He is alert.  Skin: Skin is warm and dry.  Psychiatric: He has a normal mood and affect.   Radiology: No results found.  Laboratory examination:   Recent Labs    04/08/19 1207 04/29/19 0952 05/28/19 1608  NA 140 139 138  K 4.1 4.4 4.4  CL 105 102 103  CO2 20 24 24   GLUCOSE 88 106* 105*  BUN 12 16 17   CREATININE 1.30* 1.24 1.29*  CALCIUM 9.0 9.5 9.5  GFRNONAA 60 63 60  GFRAA 69 73 70   CMP Latest Ref Rng  & Units 05/28/2019 04/29/2019 04/08/2019  Glucose 65 - 99 mg/dL 105(H) 106(H) 88  BUN 6 - 24 mg/dL 17 16 12   Creatinine 0.76 - 1.27 mg/dL 1.29(H) 1.24 1.30(H)  Sodium 134 - 144 mmol/L 138 139 140  Potassium 3.5 - 5.2 mmol/L 4.4 4.4 4.1  Chloride 96 - 106 mmol/L 103 102 105  CO2 20 - 29 mmol/L 24 24 20   Calcium 8.7 - 10.2 mg/dL 9.5 9.5 9.0  Total Protein 6.0 - 8.5 g/dL 7.9 - -  Total Bilirubin  0.0 - 1.2 mg/dL 0.6 - -  Alkaline Phos 39 - 117 IU/L 73 - -  AST 0 - 40 IU/L 18 - -  ALT 0 - 44 IU/L 14 - -   CBC Latest Ref Rng & Units 05/28/2019 04/29/2019 03/21/2016  WBC 3.4 - 10.8 x10E3/uL 3.9 4.1 3.9  Hemoglobin 13.0 - 17.7 g/dL 86.5 78.4 69.6  Hematocrit 37.5 - 51.0 % 39.1 39.6 38.5  Platelets 150 - 450 x10E3/uL 222 223 197   Lipid Panel     Component Value Date/Time   CHOL 188 05/28/2019 1608   TRIG 57 05/28/2019 1608   HDL 58 05/28/2019 1608   LDLCALC 119 (H) 05/28/2019 1608   HEMOGLOBIN A1C No results found for: HGBA1C, MPG TSH Recent Labs    05/28/19 1608  TSH 0.705   Medications and allergies  No Known Allergies   Prior to Admission medications   Medication Sig Start Date End Date Taking? Authorizing Provider  acetaminophen (TYLENOL) 325 MG tablet Take 650 mg by mouth every 6 (six) hours as needed (for pain).    Yes [provider]  albuterol (VENTOLIN HFA) 108 (90 Base) MCG/ACT inhaler INHALE 2 PUFFS INTO THE LUNGS EVERY 6 HOURS AS NEEDED FOR WHEEZING OR SHORTNESS OF BREATH 03/26/19  Yes Ambs, Norvel Richards, FNP  amLODipine (NORVASC) 5 MG tablet TAKE 1 TABLET BY MOUTH EVERY DAY 10/21/18  Yes Kathleene Hazel, MD  DESCOVY 200-25 MG tablet Take 1 tablet by mouth daily. 03/13/19  Yes [provider]  famotidine (PEPCID) 20 MG tablet Take 1 tablet (20 mg total) by mouth 2 (two) times daily. 03/26/19  Yes Ambs, Norvel Richards, FNP  fluticasone (FLOVENT HFA) 220 MCG/ACT inhaler Inhale 3 puffs into the lungs daily as needed (2 PUFFS TWICE DAILY FOR TWO WEEKS  03/26/2019-04/09/2019). 03/26/19  Yes Ambs, Norvel Richards, FNP  ipratropium (ATROVENT) 0.03 % nasal spray Place 2 sprays into both nostrils 2 (two) times daily. 03/25/18  Yes Elvina Sidle, MD  levocetirizine (XYZAL) 5 MG tablet Take 1 tablet (5 mg total) by mouth every evening. 03/26/19  Yes Ambs, Norvel Richards, FNP  tadalafil (CIALIS) 5 MG tablet Take 5 mg by mouth. 04/15/17 11/30/27 Yes [provider]  tamsulosin (FLOMAX) 0.4 MG CAPS capsule take 2 tabs by mouth daily 08/21/16  Yes [provider]  metoprolol tartrate (LOPRESSOR) 100 MG tablet Take one tablet by mouth 2 hours prior to your CT Patient not taking: Reported on 05/26/2019 04/08/19   Kathleene Hazel, MD     Current Outpatient Medications  Medication Instructions  . acetaminophen (TYLENOL) 650 mg, Oral, Every 6 hours PRN  . amLODipine (NORVASC) 5 mg, Oral, Daily  . atorvastatin (LIPITOR) 10 mg, Oral, Daily  . DESCOVY 200-25 MG tablet 1 tablet, Oral, Daily  . famotidine (PEPCID) 20 mg, Oral, 2 times daily  . fluticasone (FLONASE) 50 MCG/ACT nasal spray 1-2 sprays, Each Nare, Daily PRN  . fluticasone (FLOVENT HFA) 220 MCG/ACT inhaler 2 puffs, Inhalation, 2 times daily  . ipratropium (ATROVENT) 0.03 % nasal spray 2 sprays, Each Nare, 2 times daily  . levalbuterol (XOPENEX HFA) 45 MCG/ACT inhaler 2 puffs, Inhalation, Every 4 hours PRN  . levocetirizine (XYZAL) 5 mg, Oral, Every evening  . metoprolol succinate (TOPROL-XL) 50 mg, Oral, Daily, Take with or immediately following a meal.  . tadalafil (CIALIS) 5 mg, Oral  . tamsulosin (FLOMAX) 0.4 MG CAPS capsule take 2 tabs by mouth daily    Cardiac Studies:   Cardiac  CTA and FFR analysis 04/22/2019:  1. Coronary calcium score of 12. This was 40 percentile for age and sex matched control.  2. 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 Both arteries are originating from a wide stem with ostia adjacent to  each other.  RCA gives rise to LCX artery, PDA and PLA. LCX originates shortly after RCA origin and runs posteriorly between aortic root and left atrium and then continues in it's normal course.  Left coronary artery runs anteriorly in between aortic root and pulmonary artery.  3. There is minimal CAD in the proximal RCA and LAD. However, with anomalous LAD from right coronary sinus with anterior course, slit like origin and evidence for dynamic compression in systole a surgical consult should be considered. CT FFR will be submitted (not currently approved for this indication).  4. Moderately dilated pulmonary artery measuring 37 mm suggestive of pulmonary hypertension.   Electronically Signed   By: Tobias Alexander   On: 04/27/2019 21:02  FFRct analysis was performed on the original cardiac CT angiogram dataset. Diagrammatic representation of the FFRct analysis is provided in a separate PDF document in PACS. This dictation was created using the PDF document and an interactive 3D model of the results. 3D model is not available in the EMR/PACS. Normal FFR range is >0.80.  1. LAD: Ostial: 0.99, proximal: 0.86, mid: 0.81, distal: 0.79. 2. D1: 0.88. 3. LCX: Proximal: 0.91, mid: 0.83, distal: 0.76.  4. RCA: 0.96.  IMPRESSION: 1. CT FFR analysis showed hemodynamically significant stenosis in the LAD and LCX arteries most probably secondary to anomalous course and dynamic compression in systole.  Electronically Signed    By: Tobias Alexander   On: 05/03/2019 21:39   Echocardiogram 06/02/2019: Left ventricle cavity is normal in size. Mild concentric hypertrophy of the left ventricle. Normal LV systolic function with EF 65%. Normal global wall motion. Normal diastolic filling pattern. Calculated EF 65%. 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 Mild (Grade I) mitral regurgitation. Redundant chordae seen. Mild tricuspid regurgitation.  No  evidence of pulmonary hypertension. No significant change compared to prior hospital study on 12/30/2016.   Exercise tetrofosmin stress test  06/21/2019: Patient exercised for a total of 5 minutes and 30 seconds, achieving approximately 6.97 METs and  87% of maximum predicted heart rate. The baseline blood pressure was 180/120 mmHg and increased to 160/82 mmHg at peak exercise, which is a hypertensive response to exercise.  Normal perfusion. Stress LV EF: 56%.  No previous exam available for comparison. Low risk study.   Assessment     ICD-10-CM   1. 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 Dyspnea on exertion  R06.00   4. Benign essential HTN  I10 metoprolol succinate (TOPROL-XL) 50 MG 24 hr tablet    amLODipine (NORVASC) 5 MG tablet  5. Mild hyperlipidemia  E78.5 atorvastatin (LIPITOR) 10 MG tablet    Lipid Panel With LDL/HDL Ratio    LDL cholesterol, direct    EKG 05/26/2019: Normal sinus rhythm at rate of 96 bpm, normal axis, no evidence of ischemia, otherwise normal EKG.    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 Cardiac risk factors include hypertension, family history of  premature coronary disease, father died at age 76Y.  After review of the CTA by Dr. Freida Busman. McLean, LAD does take malignant course and also there is a significant kink associated with this.  Concerns are as patient gets to be older, with the pulmonary artery and aorta dilating, there is a good chance that he may develop ischemic symptoms and or malignant arrhythmias from the LAD compression which has a malignant course arising from the right coronary cusp.  I discussed with the patient regarding this, advised him that elective surgery is probably the best way to go.  Fortunately his nuclear  stress test is negative for medical ischemia.  I had a long discussion with the patient regarding arrhythmic his sister is present at the bedside and we spent 30 minutes discussing his options.  Although nuclear stress test is reassuring, not revealing any ischemic burden, no arrhythmias during treadmill stress testing, still concerned about ongoing risk factors.  Also the fact that he has exertional chest pain and dyspnea occasionally with extremes of exertion is an indication that he probably has some ischemic burden.  He only achieved 87% of MPHR and not 100% MPHR.  He prefers to continue medical therapy for now.  He is tolerating amlodipine for hypertension, I started him on metoprolol succinate 50 mg daily which is also tolerating, is not any more tachycardic.  Blood pressure is also well controlled.  In view of his risk factors, although LDL is mildly elevated, will start him on 10 mg of Lipitor and obtain lipid profile testing prior to his next office visit.  I would like to see him back in 3 months. "Total time spent with patient was  40 minutes and greater than 50% of that time was spent in face to face discussion, counseling"   Yates Decamp, MD, [facility] 06/29/2019, 5:52 PM [facility] Pager: [phone] Office: [phone] If no answer Cell [phone]

## 2019-07-21 ENCOUNTER — Telehealth: Payer: Self-pay

## 2019-07-21 NOTE — Telephone Encounter (Signed)
Okay 

## 2019-07-21 NOTE — Telephone Encounter (Signed)
The Patient called and said that he has not been taking his Metoprolol for over 1 month. He is going to start taking it again

## 2019-08-02 ENCOUNTER — Telehealth: Payer: Self-pay

## 2019-08-02 NOTE — Telephone Encounter (Signed)
His liver enzymes are normal and hence unlikely.  He has h/o Sarcoidosis and hence amy need neurology to see him. If okay please refer to [facility] for evaluation of peripheral neuropathy

## 2019-08-02 NOTE — Telephone Encounter (Signed)
Telephone encounter:  Reason for call: Pt called wanting to let you know that for the past year he has been feeling numbness in both arms and legs; He thinks that it can be from too much tylenol; Please advise   Usual provider: JG  Last office visit: 06/29/2019  Next office visit: 2717/2021   Last hospitalization:    Current Outpatient Medications on File Prior to Visit  Medication Sig Dispense Refill  . acetaminophen (TYLENOL) 325 MG tablet Take 650 mg by mouth every 6 (six) hours as needed (for pain).     Marland Kitchen amLODipine (NORVASC) 5 MG tablet Take 1 tablet (5 mg total) by mouth daily. 90 tablet 3  . atorvastatin (LIPITOR) 10 MG tablet Take 1 tablet (10 mg total) by mouth daily. 30 tablet 3  . DESCOVY 200-25 MG tablet Take 1 tablet by mouth daily.    . famotidine (PEPCID) 20 MG tablet Take 1 tablet (20 mg total) by mouth 2 (two) times daily. 60 tablet 5  . fluticasone (FLONASE) 50 MCG/ACT nasal spray Place 1-2 sprays into both nostrils daily as needed. 16 g 5  . fluticasone (FLOVENT HFA) 220 MCG/ACT inhaler Inhale 2 puffs into the lungs 2 (two) times daily. 12 g 5  . ipratropium (ATROVENT) 0.03 % nasal spray Place 2 sprays into both nostrils 2 (two) times daily. 30 mL 0  . levalbuterol (XOPENEX HFA) 45 MCG/ACT inhaler Inhale 2 puffs into the lungs every 4 (four) hours as needed. 1 Inhaler 5  . levocetirizine (XYZAL) 5 MG tablet Take 1 tablet (5 mg total) by mouth every evening. 30 tablet 5  . metoprolol succinate (TOPROL-XL) 50 MG 24 hr tablet Take 1 tablet (50 mg total) by mouth daily. Take with or immediately following a meal. 90 tablet 3  . tadalafil (CIALIS) 5 MG tablet Take 5 mg by mouth.    . tamsulosin (FLOMAX) 0.4 MG CAPS capsule take 2 tabs by mouth daily     No current facility-administered medications on file prior to visit.

## 2019-08-03 NOTE — Telephone Encounter (Signed)
LVM - patient will call back

## 2019-08-09 ENCOUNTER — Telehealth: Payer: Self-pay | Admitting: *Deleted

## 2019-08-09 NOTE — Telephone Encounter (Signed)
Patient called stating that he received a bill for a balance of $45. Patient states that he knows for a fact that he paid his copay at the time of visit and is wondering what this balance is for. Patient is going to try and find his receipts but would like a call back.

## 2019-08-10 NOTE — Telephone Encounter (Signed)
Called and left a message for Travis Bryant to return my call. Thanks

## 2019-08-11 ENCOUNTER — Encounter: Payer: Self-pay | Admitting: Cardiology

## 2019-09-08 ENCOUNTER — Telehealth: Payer: Self-pay

## 2019-09-08 NOTE — Telephone Encounter (Signed)
Pt called stating that since starting amlodipine, he has been having numbness and tingling in his extremities and back. He is not sure if it is the medication or if it is interacting with one of his other meds. Please review and advise.//ah

## 2019-09-08 NOTE — Telephone Encounter (Signed)
Not sure about this, he can stop for a week and see how he does

## 2019-09-09 NOTE — Telephone Encounter (Signed)
Pt aware of JG instructions and will update [place] in a week.//ah

## 2019-09-16 ENCOUNTER — Ambulatory Visit: Payer: Medicare PPO | Admitting: Neurology

## 2019-09-28 LAB — LIPID PANEL WITH LDL/HDL RATIO
Cholesterol, Total: 121 mg/dL (ref 100–199)
HDL: 52 mg/dL (ref >39)
LDL Chol Calc (NIH): 56 mg/dL (ref 0–99)
LDL/HDL Ratio: 1.1 ratio (ref 0.0–3.6)
Triglycerides: 57 mg/dL (ref 0–149)
VLDL Cholesterol Cal: 13 mg/dL (ref 5–40)

## 2019-09-28 LAB — LDL CHOLESTEROL, DIRECT: LDL Direct: 67 mg/dL (ref 0–99)

## 2019-09-28 NOTE — Progress Notes (Signed)
Normal lipids. Compared to prior lipid, LDL is now 56 from 119

## 2019-09-29 ENCOUNTER — Ambulatory Visit: Payer: Medicare PPO | Admitting: Cardiology

## 2019-09-29 ENCOUNTER — Other Ambulatory Visit: Payer: Self-pay

## 2019-09-29 ENCOUNTER — Encounter: Payer: Self-pay | Admitting: Cardiology

## 2019-09-29 VITALS — BP 130/89 | HR 108 | Temp 97.0°F | Ht 70.0 in | Wt 161.8 lb

## 2019-09-29 DIAGNOSIS — E785 Hyperlipidemia, unspecified: Secondary | ICD-10-CM

## 2019-09-29 DIAGNOSIS — Q245 Malformation of coronary vessels: Secondary | ICD-10-CM

## 2019-09-29 DIAGNOSIS — I10 Essential (primary) hypertension: Secondary | ICD-10-CM

## 2019-09-29 DIAGNOSIS — I209 Angina pectoris, unspecified: Secondary | ICD-10-CM

## 2019-09-29 DIAGNOSIS — R0609 Other forms of dyspnea: Secondary | ICD-10-CM

## 2019-09-29 NOTE — Progress Notes (Signed)
Primary Physician/Referring:  Patient, No Pcp Per  Patient ID: Travis Bryant, male    DOB: [birth year], 59 y.o.   MRN: [id number]  Chief Complaint  Patient presents with  . Chest Pain  . Coronary Artery Disease  . Follow-up    3 month   HPI:    Travis Bryant  is a 59 y.o.  African-American male with hypertension, bronchial asthma, remote tobacco use, chronic dyspnea on exertion and chronic stable angina, family history of premature CAD with MI in father at age 49 Y. 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  RCA runs posteriorly between aortic root and left atrium in normal fashion and LAD runs anteriorly between aortic root and pulmonary artery. Due to malignant course of the LAD, exertional chest pain and dyspnea, he was recommended CABG of the LAD with reimplantation, however he prefers to wait and watch for now. He now presents for a 3 month OV.  Due to medical therapy being opted, he underwent nuclear stress test to evaluate both for ischemia and arrhythmias on 06/21/19, which was negative for inducible arrhythmias and negative for ischemia.  He still continues to have exertional chest discomfort but states that this is minimal.  Past Medical History:  Diagnosis Date  . Asthma   . Benign enlargement of prostate   . Bladder disease   . Environmental allergies   . Hematuria   . 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 History of headache   . HIV exposure   . HTN (hypertension)   . Memory change 01/25/2013  . Restless legs syndrome (RLS) 01/14/2014   Past Surgical History:  Procedure Laterality Date  . ABDOMINAL SURGERY    . BRAIN SURGERY     Social History   Tobacco Use  . Smoking status: Former Smoker    Packs/day: 0.10    Years: 20.00    Pack years: 2.00    Types: Cigarettes    Quit date: 10/11/2012    Years since quitting: 6.9  . Smokeless  tobacco: Never Used  . Tobacco comment: pt was social smoker smoked 1 a month  Substance Use Topics  . Alcohol use: Yes    Alcohol/week: 0.0 standard drinks    Comment: socially    Family History  Problem Relation Age of Onset  . Lupus Mother   . Arthritis/Rheumatoid Mother   . Hypertension Father   . Heart disease Father   . Sarcoidosis Sister   . Cancer Maternal Grandmother   . Heart disease Maternal Grandfather   . Cancer - Prostate Paternal Grandfather   . Allergic rhinitis Neg Hx   . Angioedema Neg Hx   . Asthma Neg Hx   . Eczema Neg Hx   . Immunodeficiency Neg Hx   . Urticaria Neg Hx     ROS  Review of Systems  Cardiovascular: Positive for chest pain and dyspnea on exertion. Negative for leg swelling.  Gastrointestinal: Negative for melena.   Objective   Vitals with BMI 09/29/2019 06/29/2019 05/28/2019  Height 5\' 10"  5\' 10"  5\' 9"   Weight 161 lbs 13 oz 154 lbs 158 lbs  BMI 23.22 22.1 23.32  Systolic 130 117  Diastolic 89 75 70  Pulse 108 88 87    Blood pressure 130/89, pulse (!) 108, temperature (!) 97 F (36.1 C), height 5\' 10"  (1.778 m), weight 161  lb 12.8 oz (73.4 kg), SpO2 96 %. Body mass index is 23.22 kg/m.   Physical Exam  Cardiovascular: Normal rate, regular rhythm, normal heart sounds, intact distal pulses and normal pulses. Exam reveals no gallop.  No murmur heard. No leg edema, no JVD.  Pulmonary/Chest: Effort normal and breath sounds normal.  Abdominal: Soft. Bowel sounds are normal.  Musculoskeletal:     Cervical back: Neck supple.   Radiology: No results found.  Laboratory examination:   Recent Labs    04/08/19 1207 04/29/19 0952 05/28/19 1608  NA 140 139 138  K 4.1 4.4 4.4  CL 105 102 103  CO2 20 24 24   GLUCOSE 88 106* 105*  BUN 12 16 17   CREATININE 1.30* 1.24 1.29*  CALCIUM 9.0 9.5 9.5  GFRNONAA 60 63 60  GFRAA 69 73 70   CMP Latest Ref Rng & Units 05/28/2019 04/29/2019 04/08/2019  Glucose 65 - 99 mg/dL 105(H) 106(H) 88    BUN 6 - 24 mg/dL 17 16 12   Creatinine 0.76 - 1.27 mg/dL 1.29(H) 1.24 1.30(H)  Sodium 134 - 144 mmol/L 138 139 140  Potassium 3.5 - 5.2 mmol/L 4.4 4.4 4.1  Chloride 96 - 106 mmol/L 103 102 105  CO2 20 - 29 mmol/L 24 24 20   Calcium 8.7 - 10.2 mg/dL 9.5 9.5 9.0  Total Protein 6.0 - 8.5 g/dL 7.9 - -  Total Bilirubin 0.0 - 1.2 mg/dL 0.6 - -  Alkaline Phos 39 - 117 IU/L 73 - -  AST 0 - 40 IU/L 18 - -  ALT 0 - 44 IU/L 14 - -   CBC Latest Ref Rng & Units 05/28/2019 04/29/2019 03/21/2016  WBC 3.4 - 10.8 x10E3/uL 3.9 4.1 3.9  Hemoglobin 13.0 - 17.7 g/dL 13.6 13.7 13.4  Hematocrit 37.5 - 51.0 % 39.1 39.6 38.5  Platelets 150 - 450 x10E3/uL 222 223 197   Lipid Panel     Component Value Date/Time   CHOL 121 09/27/2019 1546   TRIG 57 09/27/2019 1546   HDL 52 09/27/2019 1546   LDLCALC 56 09/27/2019 1546   LDLDIRECT 67 09/27/2019 1546   HEMOGLOBIN A1C No results found for: HGBA1C, MPG TSH Recent Labs    05/28/19 1608  TSH 0.705   Medications and allergies  No Known Allergies   Current Outpatient Medications  Medication Instructions  . acetaminophen (TYLENOL) 650 mg, Oral, Every 6 hours PRN  . amLODipine (NORVASC) 5 mg, Oral, Daily  . atorvastatin (LIPITOR) 10 mg, Oral, Daily  . DESCOVY 200-25 MG tablet 1 tablet, Oral, Daily  . fluticasone (FLONASE) 50 MCG/ACT nasal spray 1-2 sprays, Each Nare, Daily PRN  . fluticasone (FLOVENT HFA) 220 MCG/ACT inhaler 2 puffs, Inhalation, 2 times daily  . ipratropium (ATROVENT) 0.03 % nasal spray 2 sprays, Each Nare, 2 times daily  . levalbuterol (XOPENEX HFA) 45 MCG/ACT inhaler 2 puffs, Inhalation, Every 4 hours PRN  . levocetirizine (XYZAL) 5 mg, Oral, Every evening  . metoprolol succinate (TOPROL-XL) 50 mg, Oral, Daily, Take with or immediately following a meal.  . tadalafil (CIALIS) 5 mg, Oral, Daily PRN  . tamsulosin (FLOMAX) 0.4 MG CAPS capsule take 2 tabs by mouth daily    Cardiac Studies:   Cardiac CTA and FFR analysis  04/22/2019:  1. Coronary calcium score of 12. This was 28 percentile for age and sex matched control.  2. 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 Both arteries are originating from a  wide stem with ostia adjacent to each other.  RCA gives rise to LCX artery, PDA and PLA. LCX originates shortly after RCA origin and runs posteriorly between aortic root and left atrium and then continues in it's normal course.  Left coronary artery runs anteriorly in between aortic root and pulmonary artery.  3. There is minimal CAD in the proximal RCA and LAD. However, with anomalous LAD from right coronary sinus with anterior course, slit like origin and evidence for dynamic compression in systole a surgical consult should be considered. CT FFR will be submitted (not currently approved for this indication).  4. Moderately dilated pulmonary artery measuring 37 mm suggestive of pulmonary hypertension.   Electronically Signed   By: Tobias Alexander   On: 04/27/2019 21:02  FFRct analysis was performed on the original cardiac CT angiogram dataset. Diagrammatic representation of the FFRct analysis is provided in a separate PDF document in PACS. This dictation was created using the PDF document and an interactive 3D model of the results. 3D model is not available in the EMR/PACS. Normal FFR range is >0.80.  1. LAD: Ostial: 0.99, proximal: 0.86, mid: 0.81, distal: 0.79. 2. D1: 0.88. 3. LCX: Proximal: 0.91, mid: 0.83, distal: 0.76.  4. RCA: 0.96.  IMPRESSION: 1. CT FFR analysis showed hemodynamically significant stenosis in the LAD and LCX arteries most probably secondary to anomalous course and dynamic compression in systole. Electronically Signed    By: Tobias Alexander   On: 05/03/2019 21:39   Echocardiogram 06/02/2019: Left ventricle cavity is normal in size. Mild concentric hypertrophy of the left ventricle. Normal LV systolic  function with EF 65%. Normal global wall motion. Normal diastolic filling pattern. Calculated EF 65%. 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 Mild (Grade I) mitral regurgitation. Redundant chordae seen. Mild tricuspid regurgitation.  No evidence of pulmonary hypertension. No significant change compared to prior hospital study on 12/30/2016.   Exercise tetrofosmin stress test  06/21/2019: Patient exercised for a total of 5 minutes and 30 seconds, achieving approximately 6.97 METs and  87% of maximum predicted heart rate. The baseline blood pressure was 180/120 mmHg and increased to 160/82 mmHg at peak exercise, which is a hypertensive response to exercise.  Normal perfusion. Stress LV EF: 56%.  No previous exam available for comparison. Low risk study.   Assessment     ICD-10-CM   1. 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 Benign essential HTN  I10   4. Dyspnea on exertion  R06.00   5. Mild hyperlipidemia  E78.5     EKG 09/29/2019: Normal sinus rhythm at rate of 92 bpm, normal axis.  No evidence of ischemia, normal EKG.  No significant change from  05/26/2019.  Recommendations:   Travis Bryant  is a  59 y.o. African-American male with hypertension, bronchial asthma, remote tobacco use, chronic dyspnea on exertion and chronic stable angina, family history of premature CAD with MI in father at age 39 Y. 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  RCA runs posteriorly between aortic root and left atrium in normal fashion and LAD runs anteriorly between aortic root and pulmonary artery. Due to malignant course of the LAD, exertional chest pain and dyspnea, he was recommended CABG of the LAD with reimplantation, however he prefers to wait and watch for now. He now presents  for a 3 month OV.  He is  presently doing well and has chronic stable angina.  He is tolerating all his medications well.  He is also on a low-dose of statin due to mildly abnormal FFR in the distal LAD and given his multiple cardiovascular risk factors although his lipids have been fairly normal.  I reviewed his recently performed labs, stable.  Continue present medications, would like to see him back in 6 months.   Patient is interested to participate in the "ORISSA" Angina Trial.  Yates Decamp, MD, [facility] 09/29/2019, 4:13 PM [facility] [place] Office: [phone]

## 2019-10-18 ENCOUNTER — Encounter: Payer: Medicare PPO | Admitting: Cardiology

## 2019-10-29 ENCOUNTER — Encounter: Payer: Self-pay | Admitting: Family Medicine

## 2019-10-29 ENCOUNTER — Other Ambulatory Visit: Payer: Self-pay

## 2019-10-29 ENCOUNTER — Ambulatory Visit: Payer: Medicare PPO | Admitting: Family Medicine

## 2019-10-29 VITALS — BP 130/84 | HR 111 | Temp 97.5°F | Resp 16

## 2019-10-29 DIAGNOSIS — J3089 Other allergic rhinitis: Secondary | ICD-10-CM

## 2019-10-29 DIAGNOSIS — K219 Gastro-esophageal reflux disease without esophagitis: Secondary | ICD-10-CM

## 2019-10-29 DIAGNOSIS — J4520 Mild intermittent asthma, uncomplicated: Secondary | ICD-10-CM

## 2019-10-29 MED ORDER — LEVOCETIRIZINE DIHYDROCHLORIDE 5 MG PO TABS: 5.0000 mg | ORAL_TABLET | Freq: Every evening | ORAL | 5 refills | Status: DC

## 2019-10-29 MED ORDER — LEVALBUTEROL TARTRATE 45 MCG/ACT IN AERO: 2.0000 | INHALATION_SPRAY | RESPIRATORY_TRACT | 5 refills | Status: DC | PRN

## 2019-10-29 NOTE — Patient Instructions (Addendum)
Asthma Continue levalbuterol (Xopenex) (blue inhaler)2 puffs every 4 hours as needed for cough or wheeze. Do not use albuterol at this time For asthma flares (using your Xopenex more than 2 days a week) begin Flovent 220 (orange/brown)-2 puffs twice a day with a spacer for 2 weeks or until you are cough and wheeze free  Allergic rhinitis Continue Flonase 1-2 sprays in each nostril once a day as needed for a stuffy nose Continue Xyzal once a day as needed for a runny nose Consider saline nasal rinses as needed for nasal symptoms. Use this before any medicated nasal sprays for best result Consider Mucinex [phone] mg twice a day as needed for thick post nasal drainage  Reflux Continue dietary and lifestyle modifications as listed below Continue famotidine 20 mg twice a day as needed for control of reflux  Call the clinic if this treatment plan is not working well for you  Follow up in 6 months or sooner if needed

## 2019-10-29 NOTE — Progress Notes (Signed)
[address] [place] [address] Dept: [phone]  FOLLOW UP NOTE  Patient ID: Travis Bryant, male    DOB: 06-20-1961  Age: 59 y.o. MRN: [id number] Date of Office Visit: 10/29/2019  Assessment  Chief Complaint: Asthma  HPI [name] is a 59 year old male who presents to the clinic for follow-up visit.  He was last seen in this clinic on 05/28/2019 for evaluation of asthma, allergic rhinitis, and reflux.  At today's visit, he reports his asthma has been well controlled with the exception of a few weeks ago when he felt short of breath after wearing a mask at which time he used his inhaler with relief of symptoms.  Overall, he reports his asthma is well controlled with no shortness of breath, cough, or wheeze.  He reports he uses his Xopenex about once a year and uses Flovent 220 about 1 time every 2 weeks.  Allergic rhinitis is reported as moderately well controlled with occasional nasal congestion and clear runny nose.  He continues Xyzal as needed and has recently increased the Xyzal to once a day.  He is not currently using Mucinex, Flonase, saline nasal rinses, or Atrovent nasal spray.  Reflux is reported as well controlled with only occasional heartburn occurring after he eats foods with a lot of grease or fried foods.  He occasionally takes famotidine with relief of heartburn symptoms.  His current medications are listed in the chart.   Drug Allergies:  No Known Allergies  Physical Exam: BP 130/84 (BP Location: Left Arm, Patient Position: Sitting, Cuff Size: Normal)   Pulse (!) 111   Temp (!) 97.5 F (36.4 C) (Temporal)   Resp 16   SpO2 98%    Physical Exam Vitals reviewed.  Constitutional:      Appearance: Normal appearance.  HENT:     Head: Normocephalic and atraumatic.     Right Ear: Tympanic membrane normal.     Left Ear: Tympanic membrane normal.     Nose:     Comments: Bilateral nares slightly erythematous with clear nasal drainage noted.  Pharynx  normal.  Ears normal.  Eyes normal.    Mouth/Throat:     Pharynx: Oropharynx is clear.  Eyes:     Conjunctiva/sclera: Conjunctivae normal.  Cardiovascular:     Rate and Rhythm: Normal rate and regular rhythm.     Heart sounds: Normal heart sounds. No murmur.  Pulmonary:     Effort: Pulmonary effort is normal.     Breath sounds: Normal breath sounds.     Comments: Lungs clear to auscultation Musculoskeletal:        General: Normal range of motion.     Cervical back: Normal range of motion and neck supple.  Skin:    General: Skin is warm and dry.  Neurological:     Mental Status: He is alert and oriented to person, place, and time.  Psychiatric:        Mood and Affect: Mood normal.        Behavior: Behavior normal.        Thought Content: Thought content normal.        Judgment: Judgment normal.     Diagnostics: FVC 3.92, FEV1 3.37.  Predicted FVC 4.05, predicted FEV1 3.16.  Spirometry indicates normal ventilatory function.  Assessment and Plan: 1. Mild intermittent asthma without complication   2. Seasonal and perennial allergic rhinitis   3. Gastroesophageal reflux disease, unspecified whether esophagitis present     Meds ordered this  encounter  Medications  . levalbuterol (XOPENEX HFA) 45 MCG/ACT inhaler    Sig: Inhale 2 puffs into the lungs every 4 (four) hours as needed.    Dispense:  1 Inhaler    Refill:  5  . levocetirizine (XYZAL) 5 MG tablet    Sig: Take 1 tablet (5 mg total) by mouth every evening.    Dispense:  30 tablet    Refill:  5    Patient Instructions  Asthma Continue levalbuterol (Xopenex) (blue inhaler)2 puffs every 4 hours as needed for cough or wheeze. Do not use albuterol at this time For asthma flares (using your Xopenex more than 2 days a week) begin Flovent 220 (orange/brown)-2 puffs twice a day with a spacer for 2 weeks or until you are cough and wheeze free  Allergic rhinitis Continue Flonase 1-2 sprays in each nostril once a day as  needed for a stuffy nose Continue Xyzal once a day as needed for a runny nose Consider saline nasal rinses as needed for nasal symptoms. Use this before any medicated nasal sprays for best result Consider Mucinex [phone] mg twice a day as needed for thick post nasal drainage  Reflux Continue dietary and lifestyle modifications as listed below Continue famotidine 20 mg twice a day as needed for control of reflux  Call the clinic if this treatment plan is not working well for you  Follow up in 6 months or sooner if needed   Return in about 6 months (around 04/30/2020), or if symptoms worsen or fail to improve.    Thank you for the opportunity to care for this patient.  Please do not hesitate to contact me with questions.  Gareth Morgan, FNP [facility]

## 2019-11-29 ENCOUNTER — Ambulatory Visit: Payer: Medicare PPO | Admitting: Neurology

## 2019-12-09 ENCOUNTER — Other Ambulatory Visit: Payer: Self-pay | Admitting: Cardiology

## 2019-12-09 DIAGNOSIS — E785 Hyperlipidemia, unspecified: Secondary | ICD-10-CM

## 2019-12-21 ENCOUNTER — Other Ambulatory Visit: Payer: Self-pay

## 2019-12-21 ENCOUNTER — Encounter: Payer: Self-pay | Admitting: Allergy & Immunology

## 2019-12-21 ENCOUNTER — Ambulatory Visit: Payer: Medicare PPO | Admitting: Allergy & Immunology

## 2019-12-21 VITALS — BP 142/80 | HR 98 | Temp 97.8°F | Resp 18 | Ht 70.0 in | Wt 160.4 lb

## 2019-12-21 DIAGNOSIS — K219 Gastro-esophageal reflux disease without esophagitis: Secondary | ICD-10-CM

## 2019-12-21 DIAGNOSIS — K9049 Malabsorption due to intolerance, not elsewhere classified: Secondary | ICD-10-CM

## 2019-12-21 DIAGNOSIS — J4520 Mild intermittent asthma, uncomplicated: Secondary | ICD-10-CM | POA: Diagnosis not present

## 2019-12-21 DIAGNOSIS — J3089 Other allergic rhinitis: Secondary | ICD-10-CM | POA: Diagnosis not present

## 2019-12-21 MED ORDER — AZELASTINE HCL 0.1 % NA SOLN: 2.0000 | Freq: Two times a day (BID) | NASAL | 5 refills | Status: DC

## 2019-12-21 NOTE — Progress Notes (Signed)
FOLLOW UP  Date of Service/Encounter:  12/21/19   Assessment:   Mild intermittent asthma without complication  Allergic rhinitis  Gastroesophageal reflux disease  Food intolerance  Plan/Recommendations:   1. Asthma - Continue levalbuterol (Xopenex) (blue inhaler)2 puffs every 4 hours as needed for cough or wheeze. Do not use albuterol at this time - For asthma flares (using your Xopenex more than 2 days a week) begin Flovent 220 (orange/brown)-2 puffs twice a day with a spacer for 2 weeks or until you are cough and wheeze free  2. Allergic rhinitis - with current flare - Start the prednisone dose pack provided.  - Continue Flonase 1-2 sprays in each nostril once a day as needed for a stuffy nose - Continue Xyzal once a day as needed for a runny nose - Consider saline nasal rinses as needed for nasal symptoms. Use this before any medicated nasal sprays for best result - Consider Mucinex [phone] mg twice a day as needed for thick post nasal drainage  3. Reflux - Continue dietary and lifestyle modifications as listed below - Continue famotidine 20 mg twice a day as needed for control of reflux  4. Food intolerances/allergies - We are going to get some labs to check out the allergy levels to milk, tomato,   5. Return in about 3 months (around 03/22/2020). This can be an in-person, a virtual Webex or a telephone follow up visit.  Subjective:   Travis Bryant is a 59 y.o. male presenting today for follow up of  Chief Complaint  Patient presents with  . Asthma    CLIMMIE BUELOW has a history of the following: Patient Active Problem List   Diagnosis Date Noted  . Mild intermittent asthma without complication 05/28/2019  . Chest pain 09/09/2018  . Family history of early CAD 09/09/2018  . Asthma with acute exacerbation 04/21/2017  . Hypertensive disorder 01/02/2017  . Benign essential HTN 12/16/2016  . Dyspnea 12/16/2016  . Memory impairment 11/06/2016  . Disorder of  bladder 08/22/2016  . Injury due to exposure to external cause 08/22/2016  . History of hematuria 08/22/2016  . 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 Increased frequency of urination 08/22/2016  . History of head injury 07/30/2016  . Acute sinusitis 12/27/2015  . Mild persistent asthma 12/27/2015  . Seasonal and perennial allergic rhinitis 12/27/2015  . Gastroesophageal reflux disease 12/27/2015  . Cough, persistent 12/27/2015  . Tendinitis of shoulder 08/29/2015  . Benign prostatic hyperplasia without urinary obstruction 06/21/2015  . History of tuberculosis 06/21/2015  . Asthma 06/19/2015  . Environmental allergies 06/19/2015  . 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 Restless legs 01/14/2014  . Memory change 01/25/2013  . Head injury 12/12/2011  . Migraine 12/12/2011    History obtained from: chart review and patient.  Travis Bryant is a 59 y.o. male presenting for a follow up visit.  He was last seen in March 2021 by Thurston Hole our nurse practitioner.  At that time, he was continued on Xopenex every 4 hours as needed with Flovent added during respiratory flares.  For his allergic rhinitis, he was continued on Flonase, Xyzal, and Mucinex.  He was continued on famotidine for his reflux.  Since last visit, he reports that he has had problems with headache and sinus pain. He blames the tree pollen. He does have nose spray - fluticasone - which he has tried using without much improvement. He has used it in the past. He does not use it every day.  He does have the levocetirizine that he uses every day without improvement.    Asthma/Respiratory Symptom History: He does have the Flovent and albuterol to use as needed. He has not needed prednisone or emergency room visits since last visit at all.  ACT score is 25, indicating excellent asthma control.  Allergic Rhinitis Symptom History: It does not seem that he is using any of his medications on a routine basis until he has issues.  He tells me  that he was on allergy shots around 20 years ago for 3 to 5 years and has stopped them.  He does not remember when he was last tested for environmental allergies.  He denies a history of nasal polyps.  Food Allergy Symptom History: He thinks that this might be related to food. He was feeling fine until Sunday. He feels "terrible". He thinks this was related to chocolate milk which he drank on Saturday.  He also reports an allergy to tomato, although it is difficult to tell what exactly the reaction is.  Otherwise, there have been no changes to his past medical history, surgical history, family history, or social history.    Review of Systems  Constitutional: Negative.  Negative for chills, fever, malaise/fatigue and weight loss.  HENT: Positive for congestion, sinus pain and sore throat. Negative for ear discharge and ear pain.   Eyes: Negative for pain, discharge and redness.  Respiratory: Negative for cough, sputum production, shortness of breath and wheezing.   Cardiovascular: Negative.  Negative for chest pain and palpitations.  Gastrointestinal: Negative for abdominal pain, constipation, diarrhea, heartburn, nausea and vomiting.  Skin: Negative.  Negative for itching and rash.  Neurological: Negative for dizziness and headaches.  Endo/Heme/Allergies: Negative for environmental allergies. Does not bruise/bleed easily.       Objective:   Blood pressure (!) 142/80, pulse 98, temperature 97.8 F (36.6 C), temperature source Temporal, resp. rate 18, height 5\' 10"  (1.778 m), weight 160 lb 6.4 oz (72.8 kg), SpO2 98 %. Body mass index is 23.02 kg/m.   Physical Exam:  Physical Exam  Constitutional: He appears well-developed.  HENT:  Head: Normocephalic and atraumatic.  Right Ear: Tympanic membrane, external ear and ear canal normal.  Left Ear: Tympanic membrane, external ear and ear canal normal.  Nose: Mucosal edema and rhinorrhea present. No nasal deformity or septal deviation. No  epistaxis. Right sinus exhibits no maxillary sinus tenderness and no frontal sinus tenderness. Left sinus exhibits no maxillary sinus tenderness and no frontal sinus tenderness.  Mouth/Throat: Uvula is midline and oropharynx is clear and moist. Mucous membranes are not pale and not dry.  No polyps.  No purulent discharge. Clear discharge present.   Eyes: Pupils are equal, round, and reactive to light. Conjunctivae and EOM are normal. Right eye exhibits no chemosis and no discharge. Left eye exhibits no chemosis and no discharge. Right conjunctiva is not injected. Left conjunctiva is not injected.  Cardiovascular: Normal rate, regular rhythm and normal heart sounds.  Respiratory: Effort normal and breath sounds normal. No accessory muscle usage. No tachypnea. No respiratory distress. He has no wheezes. He has no rhonchi. He has no rales. He exhibits no tenderness.  Moving air well in all lung fields.  No increased work of breathing.  Lymphadenopathy:    He has no cervical adenopathy.  Neurological: He is alert.  Skin: No abrasion, no petechiae and no rash noted. Rash is not papular, not vesicular and not urticarial. No erythema. No pallor.  No eczematous or urticarial  lesions noted.  Psychiatric: He has a normal mood and affect.     Diagnostic studies:   Spirometry: Normal FEV1, FVC, and FEV1/FVC ratio. There is no scooping suggestive of obstructive disease.     Allergy Studies: none       Malachi Bonds, MD  [facility]

## 2019-12-21 NOTE — Patient Instructions (Addendum)
1. Asthma - Continue levalbuterol (Xopenex) (blue inhaler)2 puffs every 4 hours as needed for cough or wheeze. Do not use albuterol at this time - For asthma flares (using your Xopenex more than 2 days a week) begin Flovent 220 (orange/brown)-2 puffs twice a day with a spacer for 2 weeks or until you are cough and wheeze free  2. Allergic rhinitis - with current flare - Start the prednisone dose pack provided.  - Continue Flonase 1-2 sprays in each nostril once a day as needed for a stuffy nose - Continue Xyzal once a day as needed for a runny nose - Consider saline nasal rinses as needed for nasal symptoms. Use this before any medicated nasal sprays for best result - Consider Mucinex [phone] mg twice a day as needed for thick post nasal drainage  3. Reflux - Continue dietary and lifestyle modifications as listed below - Continue famotidine 20 mg twice a day as needed for control of reflux  4. Food intolerances/allergies - We are going to get some labs to check out the allergy levels to milk, tomato,   5. Return in about 3 months (around 03/22/2020). This can be an in-person, a virtual Webex or a telephone follow up visit.   Please inform us of any Emergency Department visits, hospitalizations, or changes in symptoms. Call us before going to the ED for breathing or allergy symptoms since we might be able to fit you in for a sick visit. Feel free to contact us anytime with any questions, problems, or concerns.  It was a pleasure to meet you today!  Websites that have reliable patient information: 1. American Academy of Asthma, Allergy, and Immunology: www.aaaai.org 2. Food Allergy Research and Education (FARE): foodallergy.org 3. Mothers of Asthmatics: http://www.asthmacommunitynetwork.org 4. American College of Allergy, Asthma, and Immunology: www.acaai.org   COVID-19 Vaccine Information can be found at: [email or web address] For  questions related to vaccine distribution or appointments, please email [email or web address] or call [phone].     "Like" [place] on Facebook and Instagram for our latest updates!       HAPPY SPRING!  Make sure you are registered to vote! If you have moved or changed any of your contact information, you will need to get this updated before voting!  In some cases, you MAY be able to register to vote online: [email or web address]

## 2019-12-25 LAB — IGE+ALLERGENS ZONE 2(30)
Alternaria Alternata IgE: <0.1 kU/L
Amer Sycamore IgE Qn: <0.1 kU/L
Aspergillus Fumigatus IgE: <0.1 kU/L
Bahia Grass IgE: 3.67 kU/L — AB
Bermuda Grass IgE: 1.03 kU/L — AB
Cat Dander IgE: <0.1 kU/L
Cedar, Mountain IgE: <0.1 kU/L
Cladosporium Herbarum IgE: <0.1 kU/L
Cockroach, American IgE: <0.1 kU/L
Common Silver Birch IgE: <0.1 kU/L
D Farinae IgE: 0.37 kU/L — AB
D Pteronyssinus IgE: 0.39 kU/L — AB
Dog Dander IgE: <0.1 kU/L
Elm, American IgE: <0.1 kU/L
Hickory, White IgE: <0.1 kU/L
IgE (Immunoglobulin E), Serum: 221 IU/mL (ref 6–495)
Johnson Grass IgE: 2.77 kU/L — AB
Maple/Box Elder IgE: <0.1 kU/L
Mucor Racemosus IgE: <0.1 kU/L
Mugwort IgE Qn: <0.1 kU/L
Nettle IgE: <0.1 kU/L
Oak, White IgE: <0.1 kU/L
Penicillium Chrysogen IgE: <0.1 kU/L
Pigweed, Rough IgE: <0.1 kU/L
Plantain, English IgE: <0.1 kU/L
Ragweed, Short IgE: <0.1 kU/L
Sheep Sorrel IgE Qn: <0.1 kU/L
Stemphylium Herbarum IgE: <0.1 kU/L
Sweet gum IgE RAST Ql: <0.1 kU/L
Timothy Grass IgE: 6.32 kU/L — AB
White Mulberry IgE: <0.1 kU/L

## 2019-12-25 LAB — MILK COMPONENT PANEL
F076-IgE Alpha Lactalbumin: <0.1 kU/L
F077-IgE Beta Lactoglobulin: <0.1 kU/L
F078-IgE Casein: <0.1 kU/L

## 2019-12-25 LAB — ALLERGEN, TOMATO F25: Allergen Tomato, IgE: <0.1 kU/L

## 2019-12-29 ENCOUNTER — Telehealth: Payer: Self-pay | Admitting: Allergy & Immunology

## 2019-12-29 NOTE — Telephone Encounter (Signed)
Patient was seen 12/21/19. He was given prednisone and he said he was feeling better after 4 days, but is now very congested with sneezing and coughing.

## 2019-12-29 NOTE — Telephone Encounter (Signed)
Called and left voicemail asking for patient to return call. Wanted to discuss what other medications he is taking first before sending the provider a message.

## 2019-12-29 NOTE — Telephone Encounter (Signed)
Called and spoke with the patient and he stated that he has sores in his nose and only used his Flonase 2 times but didn't feel like it was helping. He stated that he is taking his Xyzal daily but was not using the nasal saline spray or Mucinex. Encouraged patient to try nasal saline again with flonase and use the mucinex for a short time with plenty of water until symptoms clear. Patient verbalize understanding and will try. Please advise any different recommendations. Thank You.

## 2019-12-30 MED ORDER — DOXYCYCLINE HYCLATE 100 MG PO CAPS: 100.0000 mg | ORAL_CAPSULE | Freq: Two times a day (BID) | ORAL | 0 refills | Status: AC

## 2019-12-30 NOTE — Addendum Note (Signed)
Addended by: Alfonse Spruce on: 12/30/2019 02:20 PM   Modules accepted: Orders

## 2019-12-30 NOTE — Telephone Encounter (Signed)
He might have a secondary bacterial infection at this point. Pended script for an antibiotic. Please confirm pharmacy and sign on my behalf.   Malachi Bonds, MD [facility]

## 2019-12-30 NOTE — Telephone Encounter (Signed)
Called and left a voicemail asking for patient to return call to inform of antibiotic. Medication has been sent to normal pharmacy that medications are regularly sent to.

## 2019-12-30 NOTE — Addendum Note (Signed)
Addended by: Dollene Cleveland R on: 12/30/2019 02:48 PM   Modules accepted: Orders

## 2019-12-31 NOTE — Telephone Encounter (Signed)
Called and advised patient of antibiotic. Patient verbalized understanding.

## 2020-03-17 ENCOUNTER — Ambulatory Visit: Payer: Medicare PPO | Admitting: Cardiology

## 2020-03-28 ENCOUNTER — Encounter: Payer: Self-pay | Admitting: Allergy & Immunology

## 2020-03-28 ENCOUNTER — Ambulatory Visit: Payer: Medicare PPO | Admitting: Allergy & Immunology

## 2020-03-28 ENCOUNTER — Other Ambulatory Visit: Payer: Self-pay

## 2020-03-28 VITALS — BP 128/80 | HR 84 | Temp 98.3°F | Resp 18 | Ht 70.0 in | Wt 159.0 lb

## 2020-03-28 DIAGNOSIS — K219 Gastro-esophageal reflux disease without esophagitis: Secondary | ICD-10-CM | POA: Diagnosis not present

## 2020-03-28 DIAGNOSIS — J4520 Mild intermittent asthma, uncomplicated: Secondary | ICD-10-CM | POA: Diagnosis not present

## 2020-03-28 DIAGNOSIS — J3089 Other allergic rhinitis: Secondary | ICD-10-CM | POA: Diagnosis not present

## 2020-03-28 NOTE — Progress Notes (Signed)
FOLLOW UP  Date of Service/Encounter:  03/28/20   Assessment:   Mild intermittent asthma without complication  Allergic rhinitis  Gastroesophageal reflux disease  Food intolerance  Plan/Recommendations:   1. Asthma - Lung testing deferred today. - We are not going to make any medication changes today. - Daily controller medication(s): NOTHING - Prior to physical activity: Xopenex 2 puffs 10-15 minutes before physical activity. - Rescue medications: Xopenex 4 puffs every 4-6 hours as needed - Changes during respiratory infections or worsening symptoms: Add on Flovent to 2 puffs twice daily for TWO WEEKS. - Asthma control goals:  * Full participation in all desired activities (may need albuterol before activity) * Albuterol use two time or less a week on average (not counting use with activity) * Cough interfering with sleep two time or less a month * Oral steroids no more than once a year * No hospitalizations  2.  Perennial and seasonal allergic rhinitis (grasses, dust mite) - Continue Flonase 1-2 sprays in each nostril once a day EVERY DAY for the best effect.  - Continue Xyzal once a day as needed for a runny nose - Consider saline nasal rinses as needed for nasal symptoms. Use this before any medicated nasal sprays for best result - Consider Mucinex [phone] mg twice a day as needed for thick post nasal drainage. - Dust mite avoidance measures provided. - Grass pollen avoidance measures provided.   3. Reflux - Continue dietary and lifestyle modifications as listed below - Continue famotidine 20 mg twice a day as needed for control of reflux  4. Return in about 6 months (around 09/28/2020). This can be an in-person, a virtual Webex or a telephone follow up visit.    Subjective:   Travis Bryant is a 59 y.o. male presenting today for follow up of  Chief Complaint  Patient presents with  . Asthma    He had a small asthma episode where he was breathing  hard. He says he air conditioner went out making it where he was working harder to breathe but he says he is fine now and since his last visit he has been well.    Travis Bryant has a history of the following: Patient Active Problem List   Diagnosis Date Noted  . Mild intermittent asthma without complication 05/28/2019  . Chest pain 09/09/2018  . Family history of early CAD 09/09/2018  . Asthma with acute exacerbation 04/21/2017  . Hypertensive disorder 01/02/2017  . Benign essential HTN 12/16/2016  . Dyspnea 12/16/2016  . Memory impairment 11/06/2016  . Disorder of bladder 08/22/2016  . Injury due to exposure to external cause 08/22/2016  . History of hematuria 08/22/2016  . 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 Increased frequency of urination 08/22/2016  . History of head injury 07/30/2016  . Acute sinusitis 12/27/2015  . Mild persistent asthma 12/27/2015  . Seasonal and perennial allergic rhinitis 12/27/2015  . Gastroesophageal reflux disease 12/27/2015  . Cough, persistent 12/27/2015  . Tendinitis of shoulder 08/29/2015  . Benign prostatic hyperplasia without urinary obstruction 06/21/2015  . History of tuberculosis 06/21/2015  . Asthma 06/19/2015  . Environmental allergies 06/19/2015  . 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 Restless legs 01/14/2014  . Memory change 01/25/2013  . Head injury 12/12/2011  . Migraine 12/12/2011    History obtained from: chart review and patient.  Travis Bryant is a 59 y.o. male presenting for a follow up visit.  He was last seen in May 2021.  At that time, we continue with Xopenex 2 puffs every 4 hours as needed.  For his asthma flares, he adds on Flovent 220 mcg 2 puffs twice daily for 2 weeks.  We did diagnose him with severe allergies and started him on prednisone.  We continued Flonase as well as Xyzal and nasal saline rinses with Mucinex as needed.  For his reflux, would continue dietary modification as well as famotidine 20 mg  twice daily.  We did do labs and milk and tomato IgE were negative.  Environmental allergy panel was positive to dust mites as well as grasses.  Since last visit, he has done very well.   Asthma/Respiratory Symptom History: He remains on Xopenex as needed.  There was 1 week a few weeks ago when he did use his Flovent for a few days, but otherwise he has not had any flares at all. Riely's asthma has been well controlled. He has not required rescue medication, experienced nocturnal awakenings due to lower respiratory symptoms, nor have activities of daily living been limited. He has required no Emergency Department or Urgent Care visits for his asthma. He has required zero courses of systemic steroids for asthma exacerbations since the last visit. ACT score today is 25, indicating excellent asthma symptom control.   Allergic Rhinitis Symptom History: He is currnetly living an apartment with carpeting throughout the home. He does not use a nose spray on a routine basis. He moved into his current apartment in November 2020.  He has not needed antibiotics.  GERD Symptom History: He remains on the famotidine 20 mg twice daily.  Overall, his symptoms are well controlled with this regimen.  Otherwise, there have been no changes to his past medical history, surgical history, family history, or social history.    Review of Systems  Constitutional: Negative.  Negative for chills, fever, malaise/fatigue and weight loss.  HENT: Positive for congestion. Negative for ear discharge and ear pain.        Positive for ear pressure bilaterally (improving)  Eyes: Negative for pain, discharge and redness.  Respiratory: Negative for cough, sputum production, shortness of breath and wheezing.   Cardiovascular: Negative.  Negative for chest pain and palpitations.  Gastrointestinal: Negative for abdominal pain, heartburn, nausea and vomiting.  Skin: Negative.  Negative for itching and rash.  Neurological: Negative  for dizziness and headaches.  Endo/Heme/Allergies: Negative for environmental allergies. Does not bruise/bleed easily.       Objective:   Blood pressure 128/80, pulse 84, temperature 98.3 F (36.8 C), temperature source Temporal, resp. rate 18, height 5\' 10"  (1.778 m), weight 159 lb (72.1 kg), SpO2 97 %. Body mass index is 22.81 kg/m.   Physical Exam:  Physical Exam Constitutional:      Appearance: He is well-developed.     Comments: Very talkative pleasant male.  HENT:     Head: Normocephalic and atraumatic.     Right Ear: Tympanic membrane, ear canal and external ear normal.     Left Ear: Tympanic membrane, ear canal and external ear normal.     Nose: No nasal deformity, septal deviation, mucosal edema or rhinorrhea.     Right Turbinates: Enlarged and swollen.     Left Turbinates: Enlarged and swollen.     Right Sinus: No maxillary sinus tenderness or frontal sinus tenderness.     Left Sinus: No maxillary sinus tenderness or frontal sinus tenderness.     Mouth/Throat:     Mouth: Mucous membranes are not pale and not dry.  Pharynx: Uvula midline.     Comments: Cobblestoning in the posterior oropharynx. Eyes:     General:        Right eye: No discharge.        Left eye: No discharge.     Conjunctiva/sclera: Conjunctivae normal.     Right eye: Right conjunctiva is not injected. No chemosis.    Left eye: Left conjunctiva is not injected. No chemosis.    Pupils: Pupils are equal, round, and reactive to light.  Cardiovascular:     Rate and Rhythm: Normal rate and regular rhythm.     Heart sounds: Normal heart sounds.  Pulmonary:     Effort: Pulmonary effort is normal. No tachypnea, accessory muscle usage or respiratory distress.     Breath sounds: Normal breath sounds. No wheezing, rhonchi or rales.     Comments: Moving air well in all lung fields.  No increased work of breathing. Chest:     Chest wall: No tenderness.  Lymphadenopathy:     Cervical: No cervical  adenopathy.  Skin:    Coloration: Skin is not pale.     Findings: No abrasion, erythema, petechiae or rash. Rash is not papular, urticarial or vesicular.  Neurological:     Mental Status: He is alert.  Psychiatric:        Behavior: Behavior is cooperative.      Diagnostic studies: none        Malachi Bonds, MD  [facility]

## 2020-03-28 NOTE — Patient Instructions (Addendum)
1. Asthma - Lung testing deferred today. - We are not going to make any medication changes today. - Daily controller medication(s): NOTHING - Prior to physical activity: Xopenex 2 puffs 10-15 minutes before physical activity. - Rescue medications: Xopenex 4 puffs every 4-6 hours as needed - Changes during respiratory infections or worsening symptoms: Add on Flovent to 2 puffs twice daily for TWO WEEKS. - Asthma control goals:  * Full participation in all desired activities (may need albuterol before activity) * Albuterol use two time or less a week on average (not counting use with activity) * Cough interfering with sleep two time or less a month * Oral steroids no more than once a year * No hospitalizations  2.  Perennial and seasonal allergic rhinitis (grasses, dust mite) - Continue Flonase 1-2 sprays in each nostril once a day EVERY DAY for the best effect.  - Continue Xyzal once a day as needed for a runny nose - Consider saline nasal rinses as needed for nasal symptoms. Use this before any medicated nasal sprays for best result - Consider Mucinex [phone] mg twice a day as needed for thick post nasal drainage. - Dust mite avoidance measures provided. - Grass pollen avoidance measures provided.   3. Reflux - Continue dietary and lifestyle modifications as listed below - Continue famotidine 20 mg twice a day as needed for control of reflux  4. Return in about 6 months (around 09/28/2020). This can be an in-person, a virtual Webex or a telephone follow up visit.    Please inform us of any Emergency Department visits, hospitalizations, or changes in symptoms. Call us before going to the ED for breathing or allergy symptoms since we might be able to fit you in for a sick visit. Feel free to contact us anytime with any questions, problems, or concerns.  It was a pleasure to see you again today!  Websites that have reliable patient information: 1. American Academy of Asthma,  Allergy, and Immunology: www.aaaai.org 2. Food Allergy Research and Education (FARE): foodallergy.org 3. Mothers of Asthmatics: http://www.asthmacommunitynetwork.org 4. American College of Allergy, Asthma, and Immunology: www.acaai.org   COVID-19 Vaccine Information can be found at: [email or web address].[email or web address] For questions related to vaccine distribution or appointments, please email [email or web address] or call [phone].     "Like" [place] on Facebook and Instagram for our latest updates!        Make sure you are registered to vote! If you have moved or changed any of your contact information, you will need to get this updated before voting!  In some cases, you MAY be able to register to vote online: [email or web address]    Reducing Pollen Exposure  The American Academy of Allergy, Asthma and Immunology suggests the following steps to reduce your exposure to pollen during allergy seasons.    1. Do not hang sheets or clothing out to dry; pollen may collect on these items. 2. Do not mow lawns or spend time around freshly cut grass; mowing stirs up pollen. 3. Keep windows closed at night.  Keep car windows closed while driving. 4. Minimize morning activities outdoors, a time when pollen counts are usually at their highest. 5. Stay indoors as much as possible when pollen counts or humidity is high and on windy days when pollen tends to remain in the air longer. 6. Use air conditioning when possible.  Many air conditioners have filters that trap the pollen spores. 7. Use a HEPA room air filter to remove pollen form the indoor air you breathe.  Control of Dust Mite Allergen    Dust mites play a major role in allergic asthma and rhinitis.  They occur in environments with high humidity wherever human skin is found.  Dust mites absorb humidity from the atmosphere (ie, they do not drink) and feed on organic  matter (including shed human and animal skin).  Dust mites are a microscopic type of insect that you cannot see with the naked eye.  High levels of dust mites have been detected from mattresses, pillows, carpets, upholstered furniture, bed covers, clothes, soft toys and any woven material.  The principal allergen of the dust mite is found in its feces.  A gram of dust may contain 1,000 mites and 250,000 fecal particles.  Mite antigen is easily measured in the air during house cleaning activities.  Dust mites do not bite and do not cause harm to humans, other than by triggering allergies/asthma.    Ways to decrease your exposure to dust mites in your home:  1. Encase mattresses, box springs and pillows with a mite-impermeable barrier or cover   2. Wash sheets, blankets and drapes weekly in hot water (130 F) with detergent and dry them in a dryer on the hot setting.  3. Have the room cleaned frequently with a vacuum cleaner and a damp dust-mop.  For carpeting or rugs, vacuuming with a vacuum cleaner equipped with a high-efficiency particulate air (HEPA) filter.  The dust mite allergic individual should not be in a room which is being cleaned and should wait 1 hour after cleaning before going into the room. 4. Do not sleep on upholstered furniture (eg, couches).   5. If possible removing carpeting, upholstered furniture and drapery from the home is ideal.  Horizontal blinds should be eliminated in the rooms where the person spends the most time (bedroom, study, television room).  Washable vinyl, roller-type shades are optimal. 6. Remove all non-washable stuffed toys from the bedroom.  Wash stuffed toys weekly like sheets and blankets above.   7. Reduce indoor humidity to less than 50%.  Inexpensive humidity monitors can be purchased at most hardware stores.  Do not use a humidifier as can make the problem worse and are not recommended.

## 2020-03-29 ENCOUNTER — Encounter: Payer: Self-pay | Admitting: Allergy & Immunology

## 2020-03-31 ENCOUNTER — Ambulatory Visit: Payer: Medicare PPO | Admitting: Cardiology

## 2020-04-07 ENCOUNTER — Ambulatory Visit: Payer: Medicare PPO | Admitting: Family Medicine

## 2020-05-01 ENCOUNTER — Ambulatory Visit: Payer: Medicare PPO | Admitting: Cardiology

## 2020-05-01 VITALS — BP 130/79 | HR 98 | Ht 70.0 in | Wt 160.0 lb

## 2020-05-01 DIAGNOSIS — I10 Essential (primary) hypertension: Secondary | ICD-10-CM

## 2020-05-01 DIAGNOSIS — E785 Hyperlipidemia, unspecified: Secondary | ICD-10-CM

## 2020-05-01 DIAGNOSIS — I209 Angina pectoris, unspecified: Secondary | ICD-10-CM

## 2020-05-01 DIAGNOSIS — Q245 Malformation of coronary vessels: Secondary | ICD-10-CM

## 2020-05-02 ENCOUNTER — Other Ambulatory Visit: Payer: Self-pay

## 2020-05-04 MED ORDER — METOPROLOL SUCCINATE ER 50 MG PO TB24: 50.0000 mg | ORAL_TABLET | Freq: Every day | ORAL | 3 refills | Status: DC

## 2020-05-20 ENCOUNTER — Encounter: Payer: Self-pay | Admitting: Cardiology

## 2020-05-20 NOTE — Progress Notes (Addendum)
Primary Physician/Referring:  Patient, No Pcp Per  Patient ID: CHRISTOPER BUSHEY, male    DOB: [birth year], 60 y.o.   MRN: [id number]  Chief Complaint  Patient presents with  . Chest Pain    Anamolous coronary artery   HPI:    [name]  is a 59 y.o.  African-American male with hypertension, bronchial asthma, remote tobacco use, chronic dyspnea on exertion and chronic stable angina, family history of premature CAD with MI in father at age 9 Y. 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  RCA runs posteriorly between aortic root and left atrium in normal fashion and LAD runs anteriorly between aortic root and pulmonary artery. Due to malignant course of the LAD, exertional chest pain and dyspnea, he was recommended CABG of the LAD with reimplantation, however he prefers to wait and watch for now. He now presents for a 3 month OV.  Due to medical therapy being opted, he underwent nuclear stress test to evaluate both for ischemia and arrhythmias on 06/21/19, which was negative for inducible arrhythmias and negative for ischemia.  He still continues to have exertional chest discomfort but states that this is minimal.   Otherwise no change in symptoms.  Past Medical History:  Diagnosis Date  . Asthma   . Benign enlargement of prostate   . Bladder disease   . Environmental allergies   . Hematuria   . 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 History of headache   . HIV exposure   . HTN (hypertension)   . Memory change 01/25/2013  . Restless legs syndrome (RLS) 01/14/2014   Past Surgical History:  Procedure Laterality Date  . ABDOMINAL SURGERY    . BRAIN SURGERY     Social History   Tobacco Use  . Smoking status: Former Smoker    Packs/day: 0.10    Years: 20.00    Pack years: 2.00    Types: Cigarettes    Quit date: 10/11/2012    Years since quitting: 7.6  .  Smokeless tobacco: Never Used  . Tobacco comment: pt was social smoker smoked 1 a month  Substance Use Topics  . Alcohol use: Yes    Alcohol/week: 0.0 standard drinks    Comment: socially    Family History  Problem Relation Age of Onset  . Lupus Mother   . Arthritis/Rheumatoid Mother   . Hypertension Father   . Heart disease Father   . Sarcoidosis Sister   . Cancer Maternal Grandmother   . Heart disease Maternal Grandfather   . Cancer - Prostate Paternal Grandfather   . Allergic rhinitis Neg Hx   . Angioedema Neg Hx   . Asthma Neg Hx   . Eczema Neg Hx   . Immunodeficiency Neg Hx   . Urticaria Neg Hx     ROS  Review of Systems  Cardiovascular: Positive for chest pain and dyspnea on exertion. Negative for leg swelling.  Gastrointestinal: Negative for melena.   Objective   Vitals with BMI 05/01/2020 03/28/2020 12/21/2019  Height 5\' 10"  5\' 10"  5\' 10"   Weight 160 lbs 159 lbs 160 lbs 6 oz  BMI 22.96 22.81 23.02  Systolic 130 128  Diastolic 79 80 80  Pulse 98 84 98    Blood pressure 130/79, pulse 98, height 5\' 10"  (1.778 m), weight 160 lb (72.6 kg). Body mass index is  22.96 kg/m.   Physical Exam Cardiovascular:     Rate and Rhythm: Normal rate and regular rhythm.     Pulses: Normal pulses and intact distal pulses.     Heart sounds: Normal heart sounds. No murmur heard.  No gallop.      Comments: No leg edema, no JVD. Pulmonary:     Effort: Pulmonary effort is normal.     Breath sounds: Normal breath sounds.  Abdominal:     General: Bowel sounds are normal.     Palpations: Abdomen is soft.  Musculoskeletal:     Cervical back: Neck supple.    Radiology: No results found.  Laboratory examination:   Recent Labs    05/28/19 1608  NA 138  K 4.4  CL 103  CO2 24  GLUCOSE 105*  BUN 17  CREATININE 1.29*  CALCIUM 9.5  GFRNONAA 60  GFRAA 70   CMP Latest Ref Rng & Units 05/28/2019 04/29/2019 04/08/2019  Glucose 65 - 99 mg/dL 245(Y) 099(I) 88  BUN 6 - 24  mg/dL 17 16 12   Creatinine 0.76 - 1.27 mg/dL ) 3.38(S 5.05)  Sodium 134 - 144 mmol/L 138 139 140  Potassium 3.5 - 5.2 mmol/L 4.4 4.4 4.1  Chloride 96 - 106 mmol/L 103 102 105  CO2 20 - 29 mmol/L 24 24 20   Calcium 8.7 - 10.2 mg/dL 9.5 9.5 9.0  Total Protein 6.0 - 8.5 g/dL 7.9 - -  Total Bilirubin 0.0 - 1.2 mg/dL 0.6 - -  Alkaline Phos 39 - 117 IU/L 73 - -  AST 0 - 40 IU/L 18 - -  ALT 0 - 44 IU/L 14 - -   CBC Latest Ref Rng & Units 05/28/2019 04/29/2019 03/21/2016  WBC 3.4 - 10.8 x10E3/uL 3.9 4.1 3.9  Hemoglobin 13.0 - 17.7 g/dL 05/01/2019 05/21/2016 73.4  Hematocrit 37.5 - 51.0 % 39.1 39.6 38.5  Platelets 150 - 450 x10E3/uL 222 223 197   Lipid Panel     Component Value Date/Time   CHOL 121 09/27/2019 1546   TRIG 57 09/27/2019 1546   HDL 52 09/27/2019 1546   LDLCALC 56 09/27/2019 1546   LDLDIRECT 67 09/27/2019 1546   HEMOGLOBIN A1C No results found for: HGBA1C, MPG TSH Recent Labs    05/28/19 1608  TSH 0.705   Medications and allergies  No Known Allergies   Current Outpatient Medications on File Prior to Visit  Medication Sig Dispense Refill  . acetaminophen (TYLENOL) 325 MG tablet Take 650 mg by mouth every 6 (six) hours as needed (for pain).     09/29/2019 amLODipine (NORVASC) 5 MG tablet Take 1 tablet (5 mg total) by mouth daily. 90 tablet 3  . atorvastatin (LIPITOR) 10 MG tablet TAKE 1 TABLET(10 MG) BY MOUTH DAILY 30 tablet 11  . azelastine (ASTELIN) 0.1 % nasal spray Place 2 sprays into both nostrils 2 (two) times daily. 30 mL 5  . DESCOVY 200-25 MG tablet Take 1 tablet by mouth daily. (Patient not taking: Reported on 03/28/2020)    . famotidine (PEPCID) 20 MG tablet  (Patient not taking: Reported on 03/28/2020)    . fluticasone (FLONASE) 50 MCG/ACT nasal spray Place 1-2 sprays into both nostrils daily as needed. 16 g 5  . fluticasone (FLOVENT HFA) 220 MCG/ACT inhaler Inhale 2 puffs into the lungs 2 (two) times daily. 12 g 5  . ipratropium (ATROVENT) 0.03 % nasal spray Place 2 sprays  into both nostrils 2 (two) times daily. (Patient not taking: Reported on 12/21/2019)  30 mL 0  . levalbuterol (XOPENEX HFA) 45 MCG/ACT inhaler Inhale 2 puffs into the lungs every 4 (four) hours as needed. 1 Inhaler 5  . levocetirizine (XYZAL) 5 MG tablet Take 1 tablet (5 mg total) by mouth every evening. 30 tablet 5  . tadalafil (CIALIS) 5 MG tablet Take 5 mg by mouth daily as needed.     . tamsulosin (FLOMAX) 0.4 MG CAPS capsule take 2 tabs by mouth daily     No current facility-administered medications on file prior to visit.     Cardiac Studies:   Cardiac CTA and FFR analysis 04/22/2019:  1. Coronary calcium score of 12. This was 91 percentile for age and sex matched control.  2. 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 Both arteries are originating from a wide stem with ostia adjacent to each other.  RCA gives rise to LCX artery, PDA and PLA. LCX originates shortly after RCA origin and runs posteriorly between aortic root and left atrium and then continues in it's normal course.  Left coronary artery runs anteriorly in between aortic root and pulmonary artery.  3. There is minimal CAD in the proximal RCA and LAD. However, with anomalous LAD from right coronary sinus with anterior course, slit like origin and evidence for dynamic compression in systole a surgical consult should be considered. CT FFR will be submitted (not currently approved for this indication).  4. Moderately dilated pulmonary artery measuring 37 mm suggestive of pulmonary hypertension.   Electronically Signed   By: Tobias Alexander   On: 04/27/2019 21:02  FFRct analysis was performed on the original cardiac CT angiogram dataset. Diagrammatic representation of the FFRct analysis is provided in a separate PDF document in PACS. This dictation was created using the PDF document and an interactive 3D model of the results. 3D model is not available in  the EMR/PACS. Normal FFR range is >0.80.  1. LAD: Ostial: 0.99, proximal: 0.86, mid: 0.81, distal: 0.79. 2. D1: 0.88. 3. LCX: Proximal: 0.91, mid: 0.83, distal: 0.76.  4. RCA: 0.96.  IMPRESSION: 1. CT FFR analysis showed hemodynamically significant stenosis in the LAD and LCX arteries most probably secondary to anomalous course and dynamic compression in systole. Electronically Signed    By: Tobias Alexander   On: 05/03/2019 21:39   Echocardiogram 06/02/2019: Left ventricle cavity is normal in size. Mild concentric hypertrophy of the left ventricle. Normal LV systolic function with EF 65%. Normal global wall motion. Normal diastolic filling pattern. Calculated EF 65%. 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 Mild (Grade I) mitral regurgitation. Redundant chordae seen. Mild tricuspid regurgitation.  No evidence of pulmonary hypertension. No significant change compared to prior hospital study on 12/30/2016.   Exercise tetrofosmin stress test  06/21/2019: Patient exercised for a total of 5 minutes and 30 seconds, achieving approximately 6.97 METs and  87% of maximum predicted heart rate. The baseline blood pressure was 180/120 mmHg and increased to 160/82 mmHg at peak exercise, which is a hypertensive response to exercise.  Normal perfusion. Stress LV EF: 56%.  No previous exam available for comparison. Low risk study.   Assessment     ICD-10-CM   1. 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 Benign essential HTN  I10 metoprolol succinate (TOPROL-XL) 50 MG 24 hr tablet  4. Mild hyperlipidemia  E78.5  Meds ordered this encounter  Medications  . metoprolol succinate (TOPROL-XL) 50 MG 24 hr tablet    Sig: Take 1 tablet (50 mg total) by mouth daily. Take with or immediately following a meal.    Dispense:  90 tablet    Refill:  3    Medications  Discontinued During This Encounter  Medication Reason  . metoprolol succinate (TOPROL-XL) 50 MG 24 hr tablet     EKG 09/29/2019: Normal sinus rhythm at rate of 92 bpm, normal axis.  No evidence of ischemia, normal EKG.  No significant change from  05/26/2019.  Recommendations:   Malcolm MetroKeith L Mervine  is a  59 y.o. African-American male with hypertension, bronchial asthma, remote tobacco use, chronic dyspnea on exertion and chronic stable angina, family history of premature CAD with MI in father at age 60 Y. 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  RCA runs posteriorly between aortic root and left atrium in normal fashion and LAD runs anteriorly between aortic root and pulmonary artery. Due to malignant course of the LAD, exertional chest pain and dyspnea, he was recommended CABG of the LAD with reimplantation, however he prefers to wait and watch for now. He now presents for a 3 month OV.  I have again reviewed with the patient regarding regular exercise, heart rate continues to be elevated, I renewed his metoprolol succinate 50 mg daily, he had been out of Rx.   He continues to have exertional chest pain, we discussed again proceeding with surgery in view of anomalous and malignant course of LAD.  Patient wants to think about this still.  I also gave her an option of event monitoring to look for any arrhythmias.  Patient is anxious and scared and does not want to change anything at present.  I discussed with him that if he has chest discomfort during exertion that he should take it easy and not push forward.  I will see him back again in 3 months for follow-up. Consider increasing metoprolol to max dose and discontinue amlodipine on next ov. Consider event monitor.    Yates DecampJay Zackerie Sara, MD, [facility] 05/20/2020, 11:32 AM Office: [phone]   This is a late entry from prior visit. Notes appear to have been  signed off without notes.

## 2020-08-02 ENCOUNTER — Ambulatory Visit: Payer: Medicare PPO | Admitting: Cardiology

## 2020-08-02 ENCOUNTER — Other Ambulatory Visit: Payer: Self-pay

## 2020-08-02 ENCOUNTER — Encounter: Payer: Self-pay | Admitting: Cardiology

## 2020-08-02 VITALS — BP 125/76 | HR 77 | Resp 16 | Ht 70.0 in | Wt 163.0 lb

## 2020-08-02 DIAGNOSIS — Q245 Malformation of coronary vessels: Secondary | ICD-10-CM

## 2020-08-02 DIAGNOSIS — I209 Angina pectoris, unspecified: Secondary | ICD-10-CM

## 2020-08-02 DIAGNOSIS — I10 Essential (primary) hypertension: Secondary | ICD-10-CM

## 2020-08-02 NOTE — Progress Notes (Signed)
Primary Physician/Referring:  Travis Bryant, No Pcp Per  Travis Bryant ID: Travis Bryant, male    DOB: Feb 28, 1961, 59 y.o.   MRN: [id number]  Chief Complaint  Travis Bryant presents with  . Chest Pain  . Follow-up   HPI:    Travis Bryant  is a 59 y.o.  African-American male with hypertension, bronchial asthma, remote tobacco use, chronic dyspnea on exertion and chronic stable angina, family history of premature CAD with MI in father at age 60 Y. 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  RCA runs posteriorly between aortic root and left atrium in normal fashion and LAD runs anteriorly between aortic root and pulmonary artery. Due to malignant course of the LAD, exertional chest pain and dyspnea, he was recommended CABG of the LAD with reimplantation, however he prefers to wait and watch for now. He now presents for a 3 month OV.  Due to medical therapy being opted, he underwent nuclear stress test on 06/21/2019 to evaluate both for ischemia and arrhythmias which was low risk.  He has had very mild dyspnea on exertion but has not had any further episodes of chest pain, otherwise no change in symptoms.  No dizziness or syncope.  He is presently tolerating all his medications, states that his blood pressure is normal, he continues to exercise by walking regularly.  Past Medical History:  Diagnosis Date  . Asthma   . Benign enlargement of prostate   . Bladder disease   . Environmental allergies   . Hematuria   . 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 History of headache   . HIV exposure   . HTN (hypertension)   . Memory change 01/25/2013  . Restless legs syndrome (RLS) 01/14/2014   Past Surgical History:  Procedure Laterality Date  . ABDOMINAL SURGERY    . BRAIN SURGERY     Social History   Tobacco Use  . Smoking status: Former Smoker    Packs/day: 0.10    Years: 20.00     Pack years: 2.00    Types: Cigarettes    Quit date: 10/11/2012    Years since quitting: 7.8  . Smokeless tobacco: Never Used  . Tobacco comment: pt was social smoker smoked 1 a month  Substance Use Topics  . Alcohol use: Yes    Alcohol/week: 0.0 standard drinks    Comment: socially    Family History  Problem Relation Age of Onset  . Lupus Mother   . Arthritis/Rheumatoid Mother   . Hypertension Father   . Heart disease Father   . Sarcoidosis Sister   . Cancer Maternal Grandmother   . Heart disease Maternal Grandfather   . Cancer - Prostate Paternal Grandfather   . Allergic rhinitis Neg Hx   . Angioedema Neg Hx   . Asthma Neg Hx   . Eczema Neg Hx   . Immunodeficiency Neg Hx   . Urticaria Neg Hx     ROS  Review of Systems  Cardiovascular: Positive for dyspnea on exertion (mild and stable). Negative for chest pain and leg swelling.  Gastrointestinal: Negative for melena.   Objective   Vitals with BMI 08/02/2020 05/01/2020 03/28/2020  Height 5\' 10"  5\' 10"  5\' 10"   Weight 163 lbs 160 lbs 159 lbs  BMI 23.39 22.96 22.81  Systolic 125 130 [id number]  Diastolic 76 79 80  Pulse 77 98 84  Blood pressure 125/76, pulse 77, resp. rate 16, height 5\' 10"  (1.778 m), weight 163 lb (73.9 kg), SpO2 97 %. Body mass index is 23.39 kg/m.   Physical Exam Cardiovascular:     Rate and Rhythm: Normal rate and regular rhythm.     Pulses: Normal pulses and intact distal pulses.     Heart sounds: Normal heart sounds. No murmur heard. No gallop.      Comments: No leg edema, no JVD. Pulmonary:     Effort: Pulmonary effort is normal.     Breath sounds: Normal breath sounds.  Abdominal:     General: Bowel sounds are normal.     Palpations: Abdomen is soft.  Musculoskeletal:     Cervical back: Neck supple.    Radiology: No results found.  Laboratory examination:   No results for input(s): NA, K, CL, CO2, GLUCOSE, BUN, CREATININE, CALCIUM, GFRNONAA, GFRAA in the last 8760 hours. CMP Latest Ref  Rng & Units 05/28/2019 04/29/2019 04/08/2019  Glucose 65 - 99 mg/dL 04/10/2019) 527(P) 88  BUN 6 - 24 mg/dL 17 16 12   Creatinine 0.76 - 1.27 mg/dL 824(M) 3.53(I)  Sodium 134 - 144 mmol/L 138 139 140  Potassium 3.5 - 5.2 mmol/L 4.4 4.4 4.1  Chloride 96 - 106 mmol/L 103 102 105  CO2 20 - 29 mmol/L 24 24 20   Calcium 8.7 - 10.2 mg/dL 9.5 9.5 9.0  Total Protein 6.0 - 8.5 g/dL 7.9 - -  Total Bilirubin 0.0 - 1.2 mg/dL 0.6 - -  Alkaline Phos 39 - 117 IU/L 73 - -  AST 0 - 40 IU/L 18 - -  ALT 0 - 44 IU/L 14 - -   CBC Latest Ref Rng & Units 05/28/2019 04/29/2019 03/21/2016  WBC 3.4 - 10.8 x10E3/uL 3.9 4.1 3.9  Hemoglobin 13.0 - 17.7 g/dL 05/30/2019 05/01/2019 05/21/2016  Hematocrit 37.5 - 51.0 % 39.1 39.6 38.5  Platelets 150 - 450 x10E3/uL 222 223 197    Lipid Panel Recent Labs    09/27/19 1546  CHOL 121  TRIG 57  LDLCALC 56  HDL 52  LDLDIRECT 67     HEMOGLOBIN A1C No results found for: HGBA1C, MPG TSH No results for input(s): TSH in the last 8760 hours. Medications and allergies  No Known Allergies   Current Outpatient Medications on File Prior to Visit  Medication Sig Dispense Refill  . acetaminophen (TYLENOL) 325 MG tablet Take 650 mg by mouth every 6 (six) hours as needed (for pain).     67.6 amLODipine (NORVASC) 5 MG tablet Take 1 tablet (5 mg total) by mouth daily. 90 tablet 3  . atorvastatin (LIPITOR) 10 MG tablet TAKE 1 TABLET(10 MG) BY MOUTH DAILY 30 tablet 11  . azelastine (ASTELIN) 0.1 % nasal spray Place 2 sprays into both nostrils 2 (two) times daily. 30 mL 5  . DESCOVY 200-25 MG tablet Take 1 tablet by mouth daily.    . famotidine (PEPCID) 20 MG tablet     . fluticasone (FLONASE) 50 MCG/ACT nasal spray Place 1-2 sprays into both nostrils daily as needed. 16 g 5  . fluticasone (FLOVENT HFA) 220 MCG/ACT inhaler Inhale 2 puffs into the lungs 2 (two) times daily. 12 g 5  . ipratropium (ATROVENT) 0.03 % nasal spray Place 2 sprays into both nostrils 2 (two) times daily. 30 mL 0  .  levalbuterol (XOPENEX HFA) 45 MCG/ACT inhaler Inhale 2 puffs into the lungs every 4 (four) hours as needed. 1 Inhaler 5  .  levocetirizine (XYZAL) 5 MG tablet Take 1 tablet (5 mg total) by mouth every evening. 30 tablet 5  . metoprolol succinate (TOPROL-XL) 50 MG 24 hr tablet Take 1 tablet (50 mg total) by mouth daily. Take with or immediately following a meal. 90 tablet 3  . tadalafil (CIALIS) 5 MG tablet Take 5 mg by mouth daily as needed.     . tamsulosin (FLOMAX) 0.4 MG CAPS capsule take 2 tabs by mouth daily     No current facility-administered medications on file prior to visit.     Cardiac Studies:   Cardiac CTA and FFR analysis 04/22/2019:  1. Coronary calcium score of 12. This was 32 percentile for age and sex matched control.  2. 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 Both arteries are originating from a wide stem with ostia adjacent to each other.  RCA gives rise to LCX artery, PDA and PLA. LCX originates shortly after RCA origin and runs posteriorly between aortic root and left atrium and then continues in it's normal course.  Left coronary artery runs anteriorly in between aortic root and pulmonary artery.  3. There is minimal CAD in the proximal RCA and LAD. However, with anomalous LAD from right coronary sinus with anterior course, slit like origin and evidence for dynamic compression in systole a surgical consult should be considered. CT FFR will be submitted (not currently approved for this indication).  4. Moderately dilated pulmonary artery measuring 37 mm suggestive of pulmonary hypertension.   Electronically Signed   By: Tobias Alexander   On: 04/27/2019 21:02  FFRct analysis was performed on the original cardiac CT angiogram dataset. Diagrammatic representation of the FFRct analysis is provided in a separate PDF document in PACS. This dictation was created using the PDF document and an  interactive 3D model of the results. 3D model is not available in the EMR/PACS. Normal FFR range is >0.80.  1. LAD: Ostial: 0.99, proximal: 0.86, mid: 0.81, distal: 0.79. 2. D1: 0.88. 3. LCX: Proximal: 0.91, mid: 0.83, distal: 0.76.  4. RCA: 0.96.  IMPRESSION: 1. CT FFR analysis showed hemodynamically significant stenosis in the LAD and LCX arteries most probably secondary to anomalous course and dynamic compression in systole. Electronically Signed    By: Tobias Alexander   On: 05/03/2019 21:39   Echocardiogram 06/02/2019: Left ventricle cavity is normal in size. Mild concentric hypertrophy of the left ventricle. Normal LV systolic function with EF 65%. Normal global wall motion. Normal diastolic filling pattern. Calculated EF 65%. 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 Mild (Grade I) mitral regurgitation. Redundant chordae seen. Mild tricuspid regurgitation.  No evidence of pulmonary hypertension. No significant change compared to prior hospital study on 12/30/2016.   Exercise tetrofosmin stress test  06/21/2019: Travis Bryant exercised for a total of 5 minutes and 30 seconds, achieving approximately 6.97 METs and  87% of maximum predicted heart rate. The baseline blood pressure was 180/120 mmHg and increased to 160/82 mmHg at peak exercise, which is a hypertensive response to exercise.  Normal perfusion. Stress LV EF: 56%.  No previous exam available for comparison. Low risk study.   EKG:   EKG 08/02/2020: Normal sinus rhythm with rate of 78 bpm, normal axis.  No evidence of ischemia, normal EKG.    Assessment     ICD-10-CM   1. 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 Angina pectoris (HCC)  I20.9 EKG 12-Lead  3. Benign essential HTN  I10    No orders of the defined types were placed in this encounter.   There are no discontinued medications.    Recommendations:   Travis Bryant  is a  59 y.o.  African-American male with  hypertension, bronchial asthma, remote tobacco use, chronic dyspnea on exertion and chronic stable angina, family history of premature CAD with MI in father at age 15 Y. 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  RCA runs posteriorly between aortic root and left atrium in normal fashion and LAD runs anteriorly between aortic root and pulmonary artery. Due to malignant course of the LAD, exertional chest pain and dyspnea, he was recommended CABG of the LAD with reimplantation, however he prefers to wait and watch for now. He now presents for a 3 month OV.  Due to medical therapy being opted, he underwent nuclear stress test on 06/21/2019 to evaluate both for ischemia and arrhythmias which was low risk.  He has had very mild dyspnea on exertion but has not had any further episodes of chest pain, otherwise no change in symptoms.  Blood pressure is well controlled, symptoms of angina has improved on medical therapy, no dizziness or syncope.  He has been active and continues to exercise regularly by means of walking.  No changes in the medications were done today, I will see him back in 1 year.  Could consider repeating a routine treadmill stress test on his next office visit.   Yates Decamp, MD, [facility] 08/02/2020, 3:04 PM Office: [phone]

## 2020-08-24 ENCOUNTER — Other Ambulatory Visit: Payer: Self-pay | Admitting: Cardiology

## 2020-08-24 DIAGNOSIS — I10 Essential (primary) hypertension: Secondary | ICD-10-CM

## 2020-08-24 DIAGNOSIS — I209 Angina pectoris, unspecified: Secondary | ICD-10-CM

## 2020-09-28 ENCOUNTER — Ambulatory Visit: Payer: Medicare PPO | Admitting: Allergy & Immunology

## 2020-10-12 ENCOUNTER — Encounter: Payer: Self-pay | Admitting: Allergy

## 2020-10-24 ENCOUNTER — Ambulatory Visit: Payer: Medicare HMO | Admitting: Allergy & Immunology

## 2020-10-24 DIAGNOSIS — J309 Allergic rhinitis, unspecified: Secondary | ICD-10-CM

## 2020-11-01 ENCOUNTER — Other Ambulatory Visit: Payer: Self-pay | Admitting: Family Medicine

## 2020-11-21 ENCOUNTER — Ambulatory Visit: Payer: Medicare HMO | Admitting: Allergy & Immunology

## 2020-11-21 ENCOUNTER — Other Ambulatory Visit: Payer: Self-pay

## 2020-11-21 ENCOUNTER — Encounter: Payer: Self-pay | Admitting: Allergy & Immunology

## 2020-11-21 VITALS — BP 112/64 | HR 80 | Temp 98.4°F | Resp 18 | Ht 70.0 in | Wt 162.4 lb

## 2020-11-21 DIAGNOSIS — K9049 Malabsorption due to intolerance, not elsewhere classified: Secondary | ICD-10-CM | POA: Diagnosis not present

## 2020-11-21 DIAGNOSIS — J4520 Mild intermittent asthma, uncomplicated: Secondary | ICD-10-CM

## 2020-11-21 DIAGNOSIS — J3089 Other allergic rhinitis: Secondary | ICD-10-CM | POA: Diagnosis not present

## 2020-11-21 DIAGNOSIS — K219 Gastro-esophageal reflux disease without esophagitis: Secondary | ICD-10-CM | POA: Diagnosis not present

## 2020-11-21 MED ORDER — FLUTICASONE PROPIONATE HFA 220 MCG/ACT IN AERO: 2.0000 | INHALATION_SPRAY | Freq: Two times a day (BID) | RESPIRATORY_TRACT | 5 refills | Status: DC

## 2020-11-21 MED ORDER — IPRATROPIUM BROMIDE 0.03 % NA SOLN: 2.0000 | Freq: Two times a day (BID) | NASAL | 0 refills | Status: DC

## 2020-11-21 MED ORDER — FLUTICASONE PROPIONATE 50 MCG/ACT NA SUSP: 1.0000 | Freq: Every day | NASAL | 5 refills | Status: DC | PRN

## 2020-11-21 MED ORDER — LEVALBUTEROL TARTRATE 45 MCG/ACT IN AERO: 2.0000 | INHALATION_SPRAY | RESPIRATORY_TRACT | 5 refills | Status: DC | PRN

## 2020-11-21 MED ORDER — AZELASTINE HCL 0.1 % NA SOLN: 2.0000 | Freq: Two times a day (BID) | NASAL | 5 refills | Status: DC

## 2020-11-21 NOTE — Progress Notes (Signed)
FOLLOW UP  Date of Service/Encounter:  11/21/20   Assessment:   Mild intermittent asthma without complication   Allergic rhinitis   Gastroesophageal reflux disease   Food intolerance  Plan/Recommendations:   1. Asthma - Lung testing deferred today. - We are not going to make any medication changes today. - Daily controller medication(s): NOTHING - Prior to physical activity: Xopenex 2 puffs 10-15 minutes before physical activity. - Rescue medications: Xopenex 4 puffs every 4-6 hours as needed - Changes during respiratory infections or worsening symptoms: Add on Flovent to 2 puffs twice daily for TWO WEEKS. - Asthma control goals:  * Full participation in all desired activities (may need albuterol before activity) * Albuterol use two time or less a week on average (not counting use with activity) * Cough interfering with sleep two time or less a month * Oral steroids no more than once a year * No hospitalizations  2.  Perennial and seasonal allergic rhinitis (grasses, dust mite) - Continue Flonase 1-2 sprays in each nostril once a day EVERY DAY for the best effect.  - Continue Xyzal once a day as needed for a runny nose - Consider saline nasal rinses as needed for nasal symptoms. Use this before any medicated nasal sprays for best result - Consider Mucinex [phone] mg twice a day as needed for thick post nasal drainage. - Dust mite avoidance measures provided. - Grass pollen avoidance measures provided.   3. Reflux - Continue dietary and lifestyle modifications as listed below - Continue famotidine 20 mg twice a day as needed for control of reflux  4. Follow up in six months or earlier if needed.     Subjective:   Travis Bryant is a 60 y.o. male presenting today for follow up of  Chief Complaint  Patient presents with  . Asthma    Travis Bryant has a history of the following: Patient Active Problem List   Diagnosis Date Noted  . Mild intermittent  asthma without complication 05/28/2019  . Chest pain 09/09/2018  . Family history of early CAD 09/09/2018  . Asthma with acute exacerbation 04/21/2017  . Hypertensive disorder 01/02/2017  . Benign essential HTN 12/16/2016  . Dyspnea 12/16/2016  . Memory impairment 11/06/2016  . Disorder of bladder 08/22/2016  . Injury due to exposure to external cause 08/22/2016  . History of hematuria 08/22/2016  . 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 Increased frequency of urination 08/22/2016  . History of head injury 07/30/2016  . Acute sinusitis 12/27/2015  . Mild persistent asthma 12/27/2015  . Seasonal and perennial allergic rhinitis 12/27/2015  . Gastroesophageal reflux disease 12/27/2015  . Cough, persistent 12/27/2015  . Tendinitis of shoulder 08/29/2015  . Benign prostatic hyperplasia without urinary obstruction 06/21/2015  . History of tuberculosis 06/21/2015  . Asthma 06/19/2015  . Environmental allergies 06/19/2015  . 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 Restless legs 01/14/2014  . Memory change 01/25/2013  . Head injury 12/12/2011  . Migraine 12/12/2011    History obtained from: chart review and patient.  Travis Bryant is a 60 y.o. male presenting for a follow up visit. He was last seen in August 2021.  At that time, we did not do lung testing.  We continue with Xopenex as needed with Flovent added during flares.  For his rhinitis, we continue with Flonase as well as Xyzal.  We gave him dust mite avoidance measures as well as grass pollen avoidance measures.  For his reflux, would continue  with famotidine 20 mg twice daily as needed.  Since the last visit, he has mostly done well. It seems to get worse around this time of the year. He reports that he can feel the labored breathing coming on. As soon as it stars getting warm, he usually has problems. He had more problems when he was in his late teens. This is certainly better than before.  Asthma/Respiratory Symptom History:  He is not interested in doing something EVERY day during the year. He is having some postnasal drip and persistent coughing. He would have issues when he would cough when he went out to eat with friends. Overall symptoms are well controlled.  He does think that he could use an inhaled steroid during certain times of the year.  Certainly the spring of the summer seem to be the worst for him.  He has not needed any systemic steroids since we last gave them to him over a year ago.  He has not been to the emergency room.  Allergic Rhinitis Symptom History: He remains on the Flonase and Xyzal.  He also has of Astelin nasal spray that he uses on a as needed basis.  He has not needed antibiotics at all since we saw him.  As with his breathing, the worst time of the year for him is the spring and the summer.  There are times the year where he does not even use anything on a routine basis.  He did get a shingles shot yesterday. This was #1 for him.   Otherwise, there have been no changes to his past medical history, surgical history, family history, or social history.    Review of Systems  Constitutional: Negative.  Negative for chills, fever, malaise/fatigue and weight loss.  HENT: Positive for congestion and sinus pain. Negative for ear discharge and ear pain.   Eyes: Negative for pain, discharge and redness.  Respiratory: Positive for cough, shortness of breath and wheezing. Negative for sputum production.   Cardiovascular: Negative.  Negative for chest pain and palpitations.  Gastrointestinal: Negative for abdominal pain, constipation, diarrhea, heartburn, nausea and vomiting.  Skin: Negative.  Negative for itching and rash.  Neurological: Negative for dizziness and headaches.  Endo/Heme/Allergies: Negative for environmental allergies. Does not bruise/bleed easily.       Objective:   Blood pressure 112/64, pulse 80, temperature 98.4 F (36.9 C), temperature source Temporal, resp. rate 18,  height 5\' 10"  (1.778 m), weight 162 lb 6.4 oz (73.7 kg), SpO2 97 %. Body mass index is 23.3 kg/m.   Physical Exam:  Physical Exam Constitutional:      Appearance: He is well-developed.     Comments: Talkative male.  Cooperative.  HENT:     Head: Normocephalic and atraumatic.     Right Ear: Tympanic membrane, ear canal and external ear normal.     Left Ear: Tympanic membrane, ear canal and external ear normal.     Nose: Mucosal edema and rhinorrhea present. No nasal deformity or septal deviation.     Right Turbinates: Enlarged, swollen and pale.     Left Turbinates: Enlarged, swollen and pale.     Right Sinus: No maxillary sinus tenderness or frontal sinus tenderness.     Left Sinus: No maxillary sinus tenderness or frontal sinus tenderness.     Comments: No nasal polyps.  Copious clear rhinorrhea.    Mouth/Throat:     Mouth: Mucous membranes are not pale and not dry.     Pharynx: Uvula midline.  Eyes:     General:        Right eye: No discharge.        Left eye: No discharge.     Conjunctiva/sclera: Conjunctivae normal.     Right eye: Right conjunctiva is not injected. No chemosis.    Left eye: Left conjunctiva is not injected. No chemosis.    Pupils: Pupils are equal, round, and reactive to light.  Cardiovascular:     Rate and Rhythm: Normal rate and regular rhythm.     Heart sounds: Normal heart sounds.  Pulmonary:     Effort: Pulmonary effort is normal. No tachypnea, accessory muscle usage or respiratory distress.     Breath sounds: Normal breath sounds. No wheezing, rhonchi or rales.     Comments: Moving air well in all lung fields.  No increased work of breathing. Chest:     Chest wall: No tenderness.  Lymphadenopathy:     Cervical: No cervical adenopathy.  Skin:    Coloration: Skin is not pale.     Findings: No abrasion, erythema, petechiae or rash. Rash is not papular, urticarial or vesicular.  Neurological:     Mental Status: He is alert.      Diagnostic  studies:    Spirometry: results normal (FEV1: 3.07/102%, FVC: 3.88/101%, FEV1/FVC: 79%).    Spirometry consistent with normal pattern.   Allergy Studies: none        Malachi Bonds, MD  [facility]

## 2020-11-21 NOTE — Patient Instructions (Addendum)
1. Asthma - Lung testing looked normal today. - Let's start Flovent two puffs twice daily. - Spacer teaching reviewed.  - In the future, let's start Flovent two puffs twice daily in February and continue through June.  - Start the prednisone pack in TWO WEEKS if you are still feeling bad (since you just got a vaccine).  - Daily controller medication(s): Flovent two puffs twice daily EVERY DAY from February through June - Prior to physical activity: Xopenex 2 puffs 10-15 minutes before physical activity. - Rescue medications: Xopenex 4 puffs every 4-6 hours as needed - Changes during respiratory infections or worsening symptoms: Add on Flovent to 2 puffs twice daily for TWO WEEKS. - Asthma control goals:  * Full participation in all desired activities (may need albuterol before activity) * Albuterol use two time or less a week on average (not counting use with activity) * Cough interfering with sleep two time or less a month * Oral steroids no more than once a year * No hospitalizations  2.  Perennial and seasonal allergic rhinitis (grasses, dust mite) - Continue Flonase 1-2 sprays in each nostril once a day EVERY DAY for the best effect.  - Continue Xyzal once a day as needed for a runny nose.  3. Reflux - Continue dietary and lifestyle modifications as listed below - Continue famotidine 20 mg twice a day as needed for control of reflux  4. Return in about 6 months (around 05/23/2021).    Please inform us of any Emergency Department visits, hospitalizations, or changes in symptoms. Call us before going to the ED for breathing or allergy symptoms since we might be able to fit you in for a sick visit. Feel free to contact us anytime with any questions, problems, or concerns.  It was a pleasure to see you again today!  Websites that have reliable patient information: 1. American Academy of Asthma, Allergy, and Immunology: www.aaaai.org 2. Food Allergy Research and Education  (FARE): foodallergy.org 3. Mothers of Asthmatics: http://www.asthmacommunitynetwork.org 4. American College of Allergy, Asthma, and Immunology: www.acaai.org   COVID-19 Vaccine Information can be found at: [email or web address] For questions related to vaccine distribution or appointments, please email [email or web address] or call [phone].   We realize that you might be concerned about having an allergic reaction to the COVID19 vaccines. To help with that concern, WE ARE OFFERING THE COVID19 VACCINES IN OUR OFFICE! Ask the front desk for dates!     "Like" [place] on Facebook and Instagram for our latest updates!      A healthy democracy works best when [employer] participate! Make sure you are registered to vote! If you have moved or changed any of your contact information, you will need to get this updated before voting!  In some cases, you MAY be able to register to vote online: [email or web address]

## 2020-11-23 ENCOUNTER — Telehealth: Payer: Self-pay | Admitting: *Deleted

## 2020-11-23 NOTE — Telephone Encounter (Signed)
PA has been submitted through CoverMyMeds for Levalbuterol HFA and is currently pending approval/denial.  

## 2020-11-23 NOTE — Telephone Encounter (Signed)
PA has been approved. PA has been faxed to pharmacy, labeled, and placed in bulk scanning.  

## 2021-05-24 ENCOUNTER — Ambulatory Visit: Payer: Medicare HMO | Admitting: Allergy & Immunology

## 2021-05-24 ENCOUNTER — Encounter: Payer: Self-pay | Admitting: Allergy & Immunology

## 2021-05-24 ENCOUNTER — Other Ambulatory Visit: Payer: Self-pay

## 2021-05-24 VITALS — BP 110/80 | HR 72 | Temp 97.3°F | Resp 16 | Ht 70.0 in | Wt 160.4 lb

## 2021-05-24 DIAGNOSIS — K219 Gastro-esophageal reflux disease without esophagitis: Secondary | ICD-10-CM

## 2021-05-24 DIAGNOSIS — J4520 Mild intermittent asthma, uncomplicated: Secondary | ICD-10-CM | POA: Diagnosis not present

## 2021-05-24 DIAGNOSIS — J3089 Other allergic rhinitis: Secondary | ICD-10-CM

## 2021-05-24 NOTE — Progress Notes (Signed)
FOLLOW UP  Date of Service/Encounter:  05/24/21   Assessment:   Mild intermittent asthma without complication   Allergic rhinitis   Gastroesophageal reflux disease   Food intolerance  Plan/Recommendations:   1. Asthma - Lung testing looked fairly normal today. - You seem to have everything under good control. - You seem to have a good handle on your symptoms.  - Daily controller medication(s): Flovent two puffs twice daily EVERY DAY from February through June only - Prior to physical activity: Xopenex 2 puffs 10-15 minutes before physical activity. - Rescue medications: Xopenex 4 puffs every 4-6 hours as needed - Changes during respiratory infections or worsening symptoms: Add on Flovent to 2 puffs twice daily for TWO WEEKS. - Asthma control goals:  * Full participation in all desired activities (may need albuterol before activity) * Albuterol use two time or less a week on average (not counting use with activity) * Cough interfering with sleep two time or less a month * Oral steroids no more than once a year * No hospitalizations  2.  Perennial and seasonal allergic rhinitis (grasses, dust mite) - Continue Flonase 1-2 sprays in each nostril once a day EVERY DAY for the best effect.  - Continue Xyzal once a day as needed for a runny nose.  3. Reflux - Continue dietary and lifestyle modifications as listed below - Continue famotidine 20 mg twice a day as needed for control of reflux  4. Return in about 6 months (around 11/22/2021).    Subjective:   Travis Bryant is a 60 y.o. male presenting today for follow up of  Chief Complaint  Patient presents with   Asthma    6 mth f/u    Travis Bryant has a history of the following: Patient Active Problem List   Diagnosis Date Noted   Mild intermittent asthma without complication 05/28/2019   Chest pain 09/09/2018   Family history of early CAD 09/09/2018   Asthma with acute exacerbation 04/21/2017    Hypertensive disorder 01/02/2017   Benign essential HTN 12/16/2016   Dyspnea 12/16/2016   Memory impairment 11/06/2016   Disorder of bladder 08/22/2016   Injury due to exposure to external cause 08/22/2016   History of hematuria 08/22/2016   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/2016   Increased frequency of urination 08/22/2016   History of head injury 07/30/2016   Acute sinusitis 12/27/2015   Mild persistent asthma 12/27/2015   Seasonal and perennial allergic rhinitis 12/27/2015   Gastroesophageal reflux disease 12/27/2015   Cough, persistent 12/27/2015   Tendinitis of shoulder 08/29/2015   Benign prostatic hyperplasia without urinary obstruction 06/21/2015   History of tuberculosis 06/21/2015   Asthma 06/19/2015   Environmental allergies 06/19/2015   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  Travis Bryant is a 60 y.o. male presenting for a follow up visit.  He was last seen in April 2022.  At that time, lung testing looked good.  We continued with Xopenex as needed and Flovent 220 added during respiratory flares. For his rhinitis, we continued with the use of Flonase as well as levocetirizine and Mucinex. We also continued with famotidine 20mg  BID.   Since the last visit, he has mostly done well. He is not feeling as well today. He started taking finasteride from his urologist. This is his new urologist. It  is making him nauseous. He has been on it for one week. He is going to be seeing him again in 6 months or so. He is not vomiting at all, but mostly he is feeling the nausea and numbness.   Asthma/Respiratory Symptom History: He has not been not the hospital.  He has been using the Flovent most February through June and then as needed. He has not needed prednisone at all.   Allergic Rhinitis Symptom History: He does have some  postprandial rhinorrhea. He just uses a nose spray as needed. He does not use antihistamine on a routine basis. He has not needed any antibiotics at all.   GERD Symptom History: Pepcid is working well. He has not needed it in a few days. This is more of a PRN medication.   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   Otherwise, there have been no changes to his past medical history, surgical history, family history, or social history.    Review of Systems  Constitutional: Negative.  Negative for chills, fever, malaise/fatigue and weight loss.  HENT: Negative.  Negative for congestion, ear discharge, ear pain and sore throat.   Eyes:  Negative for pain, discharge and redness.  Respiratory:  Negative for cough, sputum production, shortness of breath and wheezing.   Cardiovascular: Negative.  Negative for chest pain and palpitations.  Gastrointestinal:  Negative for abdominal pain, diarrhea, heartburn, nausea and vomiting.  Skin: Negative.  Negative for itching and rash.  Neurological:  Negative for dizziness and headaches.  Endo/Heme/Allergies:  Negative for environmental allergies. Does not bruise/bleed easily.      Objective:   Blood pressure 110/80, pulse 72, temperature (!) 97.3 F (36.3 C), resp. rate 16, height 5\' 10"  (1.778 m), weight 160 lb 6.4 oz (72.8 kg), SpO2 97 %. Body mass index is 23.02 kg/m.   Physical Exam:  Physical Exam Vitals reviewed.  Constitutional:      Appearance: He is well-developed.  HENT:     Head: Normocephalic and atraumatic.     Right Ear: Tympanic membrane, ear canal and external ear normal.     Left Ear: Tympanic membrane, ear canal and external ear normal.     Nose: No nasal deformity, septal deviation, mucosal edema or rhinorrhea.     Right Turbinates: Enlarged and swollen.     Left Turbinates: Enlarged and swollen.     Right Sinus: No maxillary sinus tenderness or frontal sinus tenderness.      Left Sinus: No maxillary sinus tenderness or frontal sinus tenderness.     Mouth/Throat:     Mouth: Mucous membranes are not pale and not dry.     Pharynx: Uvula midline.  Eyes:     General: Lids are normal. Allergic shiner present.        Right eye: No discharge.        Left eye: No discharge.     Conjunctiva/sclera: Conjunctivae normal.     Right eye: Right conjunctiva is not injected. No chemosis.    Left eye: Left conjunctiva is not injected. No chemosis.    Pupils: Pupils are equal, round, and reactive to light.  Cardiovascular:     Rate and Rhythm: Normal rate and regular rhythm.     Heart sounds: Normal heart sounds.  Pulmonary:     Effort: Pulmonary effort is normal. No tachypnea, accessory muscle usage or respiratory distress.     Breath sounds: Normal breath  sounds. No wheezing, rhonchi or rales.     Comments: No wheezing or crackles noted.  Chest:     Chest wall: No tenderness.  Lymphadenopathy:     Cervical: No cervical adenopathy.  Skin:    General: Skin is warm.     Capillary Refill: Capillary refill takes less than 2 seconds.     Coloration: Skin is not pale.     Findings: No abrasion, erythema, petechiae or rash. Rash is not papular, urticarial or vesicular.     Comments: No eczematous or urticarial lesions noted.   Neurological:     Mental Status: He is alert.  Psychiatric:        Behavior: Behavior is cooperative.     Diagnostic studies:    Spirometry: results normal (FEV1: 2.90, FVC: 3.87, FEV1/FVC: 75%).    Spirometry consistent with normal pattern.   Allergy Studies: none        Malachi Bonds, MD  [facility]

## 2021-05-24 NOTE — Patient Instructions (Addendum)
1. Asthma - Lung testing looked fairly normal today. - You seem to have everything under good control. - You seem to have a good handle on your symptoms.  - Daily controller medication(s): Flovent two puffs twice daily EVERY DAY from February through June only - Prior to physical activity: Xopenex 2 puffs 10-15 minutes before physical activity. - Rescue medications: Xopenex 4 puffs every 4-6 hours as needed - Changes during respiratory infections or worsening symptoms: Add on Flovent to 2 puffs twice daily for TWO WEEKS. - Asthma control goals:  * Full participation in all desired activities (may need albuterol before activity) * Albuterol use two time or less a week on average (not counting use with activity) * Cough interfering with sleep two time or less a month * Oral steroids no more than once a year * No hospitalizations  2.  Perennial and seasonal allergic rhinitis (grasses, dust mite) - Continue Flonase 1-2 sprays in each nostril once a day EVERY DAY for the best effect.  - Continue Xyzal once a day as needed for a runny nose.  3. Reflux - Continue dietary and lifestyle modifications as listed below - Continue famotidine 20 mg twice a day as needed for control of reflux  4. Return in about 6 months (around 11/22/2021).    Please inform us of any Emergency Department visits, hospitalizations, or changes in symptoms. Call us before going to the ED for breathing or allergy symptoms since we might be able to fit you in for a sick visit. Feel free to contact us anytime with any questions, problems, or concerns.  It was a pleasure to see you again today!  Websites that have reliable patient information: 1. American Academy of Asthma, Allergy, and Immunology: www.aaaai.org 2. Food Allergy Research and Education (FARE): foodallergy.org 3. Mothers of Asthmatics: http://www.asthmacommunitynetwork.org 4. American College of Allergy, Asthma, and Immunology:  www.acaai.org   COVID-19 Vaccine Information can be found at: [email or web address] For questions related to vaccine distribution or appointments, please email [email or web address] or call [phone].   We realize that you might be concerned about having an allergic reaction to the COVID19 vaccines. To help with that concern, WE ARE OFFERING THE COVID19 VACCINES IN OUR OFFICE! Ask the front desk for dates!     "Like" [place] on Facebook and Instagram for our latest updates!      A healthy democracy works best when [employer] participate! Make sure you are registered to vote! If you have moved or changed any of your contact information, you will need to get this updated before voting!  In some cases, you MAY be able to register to vote online: [email or web address]

## 2021-05-28 ENCOUNTER — Encounter: Payer: Self-pay | Admitting: Allergy & Immunology

## 2021-08-02 ENCOUNTER — Ambulatory Visit: Payer: Medicare PPO | Admitting: Cardiology

## 2021-08-27 ENCOUNTER — Ambulatory Visit: Payer: Medicare HMO | Admitting: Cardiology

## 2021-09-03 ENCOUNTER — Other Ambulatory Visit: Payer: Self-pay | Admitting: Family Medicine

## 2021-09-05 ENCOUNTER — Ambulatory Visit (INDEPENDENT_AMBULATORY_CARE_PROVIDER_SITE_OTHER): Payer: Medicare HMO

## 2021-09-05 ENCOUNTER — Encounter (HOSPITAL_COMMUNITY): Payer: Self-pay | Admitting: Emergency Medicine

## 2021-09-05 ENCOUNTER — Other Ambulatory Visit: Payer: Self-pay

## 2021-09-05 ENCOUNTER — Ambulatory Visit (HOSPITAL_COMMUNITY)
Admission: EM | Admit: 2021-09-05 | Discharge: 2021-09-05 | Disposition: A | Discharge status: Home / Self Care | Payer: Medicare HMO | Attending: Family Medicine | Admitting: Family Medicine

## 2021-09-05 DIAGNOSIS — J4541 Moderate persistent asthma with (acute) exacerbation: Secondary | ICD-10-CM

## 2021-09-05 DIAGNOSIS — R059 Cough, unspecified: Secondary | ICD-10-CM | POA: Diagnosis not present

## 2021-09-05 DIAGNOSIS — R062 Wheezing: Secondary | ICD-10-CM

## 2021-09-05 MED ORDER — PREDNISONE 20 MG PO TABS: 40.0000 mg | ORAL_TABLET | Freq: Every day | ORAL | 0 refills | Status: AC

## 2021-09-05 NOTE — ED Provider Notes (Signed)
Travis Bryant    CSN: [id number] Arrival date & time: 09/05/21  1411      History   Chief Complaint Chief Complaint  Patient presents with   Cough    HPI Travis Bryant is a 61 y.o. male.    Cough Here for cough and congestion.  About 2 weeks ago he began having fever and chills, cough and rhinorrhea.  He did contact his primary provider, and they instructed him on over-the-counter medication to take like Coricidin.  He did improve and has not had fever or chills since about 7 days ago.  He has continued to have cough however and still has a lot of congestion in his chest.  The patient does have asthma and uses Flovent daily and also Xopenex as needed.  He has had some increase in wheezing in the last 2 weeks.  Past Medical History:  Diagnosis Date   Asthma    Benign enlargement of prostate    Bladder disease    Environmental allergies    Hematuria    History of closed head injury 1966   Involving the left brain   History of headache    HIV exposure    HTN (hypertension)    Memory change 01/25/2013   Restless legs syndrome (RLS) 01/14/2014    Patient Active Problem List   Diagnosis Date Noted   Mild intermittent asthma without complication [id number]   Chest pain 09/09/2018   Family history of early CAD 09/09/2018   Asthma with acute exacerbation 04/21/2017   Hypertensive disorder 01/02/2017   Benign essential HTN 12/16/2016   Dyspnea 12/16/2016   Memory impairment 11/06/2016   Disorder of bladder 08/22/2016   Injury due to exposure to external cause 08/22/2016   History of hematuria 08/22/2016   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/2016   Increased frequency of urination 08/22/2016   History of head injury 07/30/2016   Acute sinusitis 12/27/2015   Mild persistent asthma 12/27/2015   Seasonal and perennial allergic rhinitis 12/27/2015   Gastroesophageal reflux disease 12/27/2015   Cough, persistent 12/27/2015   Tendinitis of shoulder  08/29/2015   Benign prostatic hyperplasia without urinary obstruction 06/21/2015   History of tuberculosis 06/21/2015   Asthma 06/19/2015   Environmental allergies 06/19/2015   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 Authorizing Provider  predniSONE (DELTASONE) 20 MG tablet Take 2 tablets (40 mg total) by mouth daily with breakfast for 5 days. 09/05/21 09/10/21 Yes Ensley Blas, Gwenlyn Perking, MD  acetaminophen (TYLENOL) 325 MG tablet Take 650 mg by mouth every 6 (six) hours as needed (for pain).     [provider]  amLODipine (NORVASC) 5 MG tablet Take 1 tablet (5 mg total) by mouth daily. 06/29/19   Adrian Prows, MD  atorvastatin (LIPITOR) 10 MG tablet TAKE 1 TABLET(10 MG) BY MOUTH DAILY 12/09/19   Adrian Prows, MD  azelastine (ASTELIN) 0.1 % nasal spray Place 2 sprays into both nostrils 2 (two) times daily. 11/21/20   Valentina Shaggy, MD  DESCOVY 200-25 MG tablet Take 1 tablet by mouth daily. 03/13/19   [provider]  famotidine (PEPCID) 20 MG  tablet  12/01/19   [provider]  finasteride (PROSCAR) 5 MG tablet Take 1 tablet by mouth daily. 04/30/21   [provider]  fluticasone (FLONASE) 50 MCG/ACT nasal spray Place 1-2 sprays into both nostrils daily as needed. 11/21/20   Valentina Shaggy, MD  fluticasone (FLOVENT HFA) 220 MCG/ACT inhaler Inhale 2 puffs into the lungs 2 (two) times daily. 11/21/20   Valentina Shaggy, MD  ipratropium (ATROVENT) 0.03 % nasal spray Place 2 sprays into both nostrils 2 (two) times daily. 11/21/20   Valentina Shaggy, MD  levalbuterol [facility] HFA) 45 MCG/ACT inhaler Inhale 2 puffs into the lungs every 4 (four) hours as needed.  11/21/20   Valentina Shaggy, MD  levocetirizine (XYZAL) 5 MG tablet TAKE 1 TABLET(5 MG) BY MOUTH EVERY EVENING 09/04/21   Valentina Shaggy, MD  metoprolol succinate (TOPROL-XL) 50 MG 24 hr tablet TAKE 1 TABLET BY MOUTH DAILY TAKE WITH OR IMMEDIATELY FOLLOWING A MEAL 08/25/20   Tolia, Sunit, DO  tadalafil (CIALIS) 5 MG tablet Take 5 mg by mouth daily as needed.  04/15/17 11/30/27  [provider]  tamsulosin (FLOMAX) 0.4 MG CAPS capsule take 2 tabs by mouth daily 08/21/16   [provider]    Family History Family History  Problem Relation Age of Onset   Lupus Mother    Arthritis/Rheumatoid Mother    Hypertension Father    Heart disease Father    Sarcoidosis Sister    Cancer Maternal Grandmother    Heart disease Maternal Grandfather    Cancer - Prostate Paternal Grandfather    Allergic rhinitis Neg Hx    Angioedema Neg Hx    Asthma Neg Hx    Eczema Neg Hx    Immunodeficiency Neg Hx    Urticaria Neg Hx     Social History Social History   Tobacco Use   Smoking status: Former    Packs/day: 0.10    Years: 20.00    Pack years: 2.00    Types: Cigarettes    Quit date: 10/11/2012    Years since quitting: 8.9   Smokeless tobacco: Never   Tobacco comments:    pt was social smoker smoked 1 a month  Vaping Use   Vaping Use: Never used  Substance Use Topics   Alcohol use: Yes    Alcohol/week: 0.0 standard drinks    Comment: socially   Drug use: No     Allergies   Patient has no known allergies.   Review of Systems Review of Systems  Respiratory:  Positive for cough.     Physical Exam Triage Vital Signs ED Triage Vitals  Enc Vitals Group     BP 09/05/21 1459 136/76     Pulse Rate 09/05/21 1459 85     Resp 09/05/21 1459 18     Temp 09/05/21 1459 98.6 F (37 C)     Temp Source 09/05/21 1459 Oral     SpO2 09/05/21 1459 98 %     Weight --      Height --      Head Circumference --      Peak Flow --      Pain Score 09/05/21 1458 0     Pain Loc  --      Pain Edu? --      Excl. in [place]? --    No data found.  Updated Vital Signs BP 136/76 (BP Location: Left Arm)    Pulse 85    Temp  98.6 F (37 C) (Oral)    Resp 18    SpO2 98%   Visual Acuity Right Eye Distance:   Left Eye Distance:   Bilateral Distance:    Right Eye Near:   Left Eye Near:    Bilateral Near:     Physical Exam Vitals reviewed.  Constitutional:      General: He is not in acute distress.    Appearance: He is not toxic-appearing.  HENT:     Right Ear: Tympanic membrane and ear canal normal.     Left Ear: Tympanic membrane and ear canal normal.     Nose: Nose normal.     Mouth/Throat:     Mouth: Mucous membranes are moist.     Pharynx: No oropharyngeal exudate or posterior oropharyngeal erythema.  Eyes:     Extraocular Movements: Extraocular movements intact.     Conjunctiva/sclera: Conjunctivae normal.     Pupils: Pupils are equal, round, and reactive to light.  Cardiovascular:     Rate and Rhythm: Normal rate and regular rhythm.     Heart sounds: No murmur heard. Pulmonary:     Effort: Pulmonary effort is normal. No respiratory distress.     Breath sounds: No stridor. No wheezing, rhonchi or rales.     Comments: Good air movement Musculoskeletal:     Cervical back: Neck supple.  Lymphadenopathy:     Cervical: No cervical adenopathy.  Skin:    Capillary Refill: Capillary refill takes less than 2 seconds.     Coloration: Skin is not jaundiced or pale.  Neurological:     General: No focal deficit present.     Mental Status: He is alert and oriented to person, place, and time.  Psychiatric:        Behavior: Behavior normal.     UC Treatments / Results  Labs (all labs ordered are listed, but only abnormal results are displayed) Labs Reviewed - No data to display  EKG   Radiology DG Chest 2 View  Result Date: 09/05/2021 CLINICAL DATA:  Cough, congestion and wheezing for 2 weeks. EXAM: CHEST - 2 VIEW COMPARISON:  05/08/2017 FINDINGS: The  cardiac silhouette, mediastinal and hilar contours are normal. The lungs are clear. No infiltrates, edema or effusions. No peribronchial thickening to suggest bronchitis. No pulmonary lesions. The bony thorax is intact. IMPRESSION: No acute cardiopulmonary findings. Electronically Signed   By: Marijo Sanes M.D.   On: 09/05/2021 15:44    Procedures Procedures (including critical care time)  Medications Ordered in UC Medications - No data to display  Initial Impression / Assessment and Plan / UC Course  I have reviewed the triage vital signs and the nursing notes.  Pertinent labs & imaging results that were available during my care of the patient were reviewed by me and considered in my medical decision making (see chart for details).     Chest x-ray is clear.  We will treat for asthma exacerbation.  He is already taking Mucus Relief over-the-counter.  Discussed that he does not seem to need antibiotics at this point  Also it turns out he has not been using his Flovent faithfully twice a day.  He will restart that Final Clinical Impressions(s) / UC Diagnoses   Final diagnoses:  Moderate persistent asthma with exacerbation     Discharge Instructions      Your chest x-ray was clear, so did not show any pneumonia.  Take prednisone 20 mg 2 tabs daily for 5 days.  This is  for inflammation in your lungs.  Also restart using your Flovent twice a day faithfully     ED Prescriptions     Medication Sig Dispense Auth. Provider   predniSONE (DELTASONE) 20 MG tablet Take 2 tablets (40 mg total) by mouth daily with breakfast for 5 days. 10 tablet Windy Carina Gwenlyn Perking, MD      PDMP not reviewed this encounter.   Barrett Henle, MD 09/05/21 [phone]

## 2021-09-05 NOTE — Discharge Instructions (Addendum)
Your chest x-ray was clear, so did not show any pneumonia.  Take prednisone 20 mg 2 tabs daily for 5 days.  This is for inflammation in your lungs.  Also restart using your Flovent twice a day faithfully

## 2021-09-05 NOTE — ED Triage Notes (Signed)
Pt reports had a cough over 2 week with chills.pt reports green phlegm with cough.  Reports called PCP and recommended which medications to take OTC due to having HTN.  Pt reports that niece mentioned pt might need Z pack.

## 2021-09-10 ENCOUNTER — Encounter: Payer: Self-pay | Admitting: Cardiology

## 2021-09-10 ENCOUNTER — Other Ambulatory Visit: Payer: Self-pay

## 2021-09-10 ENCOUNTER — Ambulatory Visit: Payer: Medicare HMO | Admitting: Cardiology

## 2021-09-10 VITALS — BP 132/75 | HR 85 | Temp 98.0°F | Resp 17 | Ht 70.0 in | Wt 159.8 lb

## 2021-09-10 DIAGNOSIS — Q245 Malformation of coronary vessels: Secondary | ICD-10-CM

## 2021-09-10 DIAGNOSIS — E785 Hyperlipidemia, unspecified: Secondary | ICD-10-CM

## 2021-09-10 DIAGNOSIS — I10 Essential (primary) hypertension: Secondary | ICD-10-CM

## 2021-09-10 MED ORDER — AMLODIPINE BESYLATE 5 MG PO TABS: 5.0000 mg | ORAL_TABLET | Freq: Every day | ORAL | 3 refills | Status: DC

## 2021-09-10 NOTE — Progress Notes (Signed)
Primary Physician/Referring:  Cipriano Mile, NP  Patient ID: Travis Bryant, male    DOB: 05-18-1961, 61 y.o.   MRN: [id number]  Chief Complaint  Patient presents with   Chest Pain   Follow-up    1 year   HPI:    Travis Bryant  is a 61 y.o.  African-American male with hypertension, bronchial asthma, remote tobacco use, chronic dyspnea on exertion and chronic stable angina, family history of premature CAD with MI in father at age 35 Y. 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  RCA runs posteriorly between aortic root and left atrium in normal fashion and LAD runs anteriorly between aortic root and pulmonary artery. Due to malignant course of the LAD, exertional chest pain and dyspnea, he was recommended CABG of the LAD with reimplantation, however he prefers to wait and watch for now. He now presents for a 3 month OV.  Due to medical therapy being opted, he underwent nuclear stress test on 06/21/2019 to evaluate both for ischemia and arrhythmias which was low risk.  He has had very mild dyspnea on exertion but has not had any further episodes of chest pain, otherwise no change in symptoms.    No dizziness or syncope.  He is presently tolerating all his medications, states that his blood pressure is normal, he continues to exercise by walking regularly.  Past Medical History:  Diagnosis Date   Asthma    Benign enlargement of prostate    Bladder disease    Environmental allergies    Hematuria    History of closed head injury 1966   Involving the left brain   History of headache    HIV exposure    HTN (hypertension)    Memory change 01/25/2013   Restless legs syndrome (RLS) 01/14/2014   Past Surgical History:  Procedure Laterality Date   ABDOMINAL SURGERY     BRAIN SURGERY     Social History   Tobacco Use   Smoking status: Former    Packs/day: 0.10    Years: 20.00    Pack  years: 2.00    Types: Cigarettes    Quit date: 10/11/2012    Years since quitting: 8.9   Smokeless tobacco: Never   Tobacco comments:    pt was social smoker smoked 1 a month  Substance Use Topics   Alcohol use: Yes    Alcohol/week: 0.0 standard drinks    Comment: socially    Family History  Problem Relation Age of Onset   Lupus Mother    Arthritis/Rheumatoid Mother    Hypertension Father    Heart disease Father    Sarcoidosis Sister    Cancer Maternal Grandmother    Heart disease Maternal Grandfather    Cancer - Prostate Paternal Grandfather    Allergic rhinitis Neg Hx    Angioedema Neg Hx    Asthma Neg Hx    Eczema Neg Hx    Immunodeficiency Neg Hx    Urticaria Neg Hx     ROS  Review of Systems  Cardiovascular:  Positive for dyspnea on exertion (mild and stable). Negative for chest pain and leg swelling.  Gastrointestinal:  Negative for melena.  Objective   Vitals with BMI 09/10/2021 09/05/2021 05/24/2021  Height '5\' 10"'  - '5\' 10"'   Weight 159 lbs 13 oz - 160 lbs 6 oz  BMI 83.38 - 25.05  Systolic 397 673 419  Diastolic 75 76 80  Pulse 85 85 72    Blood pressure 132/75, pulse 85, temperature 98 F (36.7 C), temperature source Temporal, resp. rate 17, height '5\' 10"'  (1.778 m), weight 159 lb 12.8 oz (72.5 kg), SpO2 96 %. Body mass index is 22.93 kg/m.   Physical Exam Neck:     Vascular: No carotid bruit or JVD.  Cardiovascular:     Rate and Rhythm: Normal rate and regular rhythm.     Pulses: Intact distal pulses.     Heart sounds: Normal heart sounds. No murmur heard.   No gallop.  Pulmonary:     Effort: Pulmonary effort is normal.     Breath sounds: Normal breath sounds.  Abdominal:     General: Bowel sounds are normal.     Palpations: Abdomen is soft.  Musculoskeletal:     Right lower leg: No edema.     Left lower leg: No edema.   Radiology: No results found.  Laboratory examination:   No results for input(s): NA, K, CL, CO2, GLUCOSE, BUN, CREATININE,  CALCIUM, GFRNONAA, GFRAA in the last 8760 hours. CMP Latest Ref Rng & Units 05/28/2019 04/29/2019 04/08/2019  Glucose 65 - 99 mg/dL 105(H) 106(H) 88  BUN 6 - 24 mg/dL '17 16 12  ' Creatinine 0.76 - 1.27 mg/dL 1.29(H) 1.24 1.30(H)  Sodium 134 - 144 mmol/L 138 139 140  Potassium 3.5 - 5.2 mmol/L 4.4 4.4 4.1  Chloride 96 - 106 mmol/L 103 102 105  CO2 20 - 29 mmol/L '24 24 20  ' Calcium 8.7 - 10.2 mg/dL 9.5 9.5 9.0  Total Protein 6.0 - 8.5 g/dL 7.9 - -  Total Bilirubin 0.0 - 1.2 mg/dL 0.6 - -  Alkaline Phos 39 - 117 IU/L 73 - -  AST 0 - 40 IU/L 18 - -  ALT 0 - 44 IU/L 14 - -   CBC Latest Ref Rng & Units 05/28/2019 04/29/2019 03/21/2016  WBC 3.4 - 10.8 x10E3/uL 3.9 4.1 3.9  Hemoglobin 13.0 - 17.7 g/dL 13.6 13.7 13.4  Hematocrit 37.5 - 51.0 % 39.1 39.6 38.5  Platelets 150 - 450 x10E3/uL 222 223 197    Lipid Panel No results for input(s): CHOL, TRIG, LDLCALC, VLDL, HDL, CHOLHDL, LDLDIRECT in the last 8760 hours.   Labs 05/28/2019:  TSH normal.  LPA normal.  CBC normal, Hb 13.7/HCT 39.6, platelets 223, normal indicis.  Medications and allergies  No Known Allergies   Current Outpatient Medications on File Prior to Visit  Medication Sig Dispense Refill   acetaminophen (TYLENOL) 325 MG tablet Take 650 mg by mouth every 6 (six) hours as needed (for pain).      atorvastatin (LIPITOR) 10 MG tablet TAKE 1 TABLET(10 MG) BY MOUTH DAILY 30 tablet 11   azelastine (ASTELIN) 0.1 % nasal spray Place 2 sprays into both nostrils 2 (two) times daily. 30 mL 5   DESCOVY 200-25 MG tablet Take 1 tablet by mouth daily.     famotidine (PEPCID) 20 MG tablet      fluticasone (FLONASE) 50 MCG/ACT nasal spray Place 1-2 sprays into both nostrils daily as needed. 16 g 5   fluticasone (FLOVENT HFA) 220 MCG/ACT inhaler Inhale 2 puffs into the lungs 2 (two) times daily. 12 g 5   ipratropium (ATROVENT) 0.03 % nasal spray Place 2 sprays into both nostrils 2 (two) times daily. 30 mL 0   levalbuterol (XOPENEX HFA) 45 MCG/ACT  inhaler Inhale 2 puffs into the lungs every 4 (four) hours as needed.  1 each 5   levocetirizine (XYZAL) 5 MG tablet TAKE 1 TABLET(5 MG) BY MOUTH EVERY EVENING 30 tablet 5   metoprolol succinate (TOPROL-XL) 50 MG 24 hr tablet TAKE 1 TABLET BY MOUTH DAILY TAKE WITH OR IMMEDIATELY FOLLOWING A MEAL 90 tablet 3   predniSONE (DELTASONE) 20 MG tablet Take 2 tablets (40 mg total) by mouth daily with breakfast for 5 days. 10 tablet 0   rOPINIRole (REQUIP) 0.25 MG tablet Take 0.25-0.5 mg by mouth at bedtime.     tadalafil (CIALIS) 5 MG tablet Take 5 mg by mouth daily as needed.      tamsulosin (FLOMAX) 0.4 MG CAPS capsule take 2 tabs by mouth daily     No current facility-administered medications on file prior to visit.     Cardiac Studies:   Cardiac CTA and FFR analysis 04/22/2019:  1. Coronary calcium score of 12. This was 8 percentile for age and sex matched control.   2. 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 Both arteries are originating from a wide stem with ostia adjacent to each other.  RCA gives rise to LCX artery, PDA and PLA. LCX originates shortly after RCA origin and runs posteriorly between aortic root and left atrium and then continues in it's normal course.  Left coronary artery runs anteriorly in between aortic root and pulmonary artery.   3. There is minimal CAD in the proximal RCA and LAD. However, with anomalous LAD from right coronary sinus with anterior course, slit like origin and evidence for dynamic compression in systole a surgical consult should be considered. CT FFR will be submitted (not currently approved for this indication).   4. Moderately dilated pulmonary artery measuring 37 mm suggestive of pulmonary hypertension.     Electronically Signed   By: Ena Dawley   On: 04/27/2019 21:02  FFRct analysis was performed on the original cardiac CT angiogram dataset. Diagrammatic representation of  the FFRct analysis is provided in a separate PDF document in PACS. This dictation was created using the PDF document and an interactive 3D model of the results. 3D model is not available in the EMR/PACS. Normal FFR range is >0.80.   1. LAD: Ostial: 0.99, proximal: 0.86, mid: 0.81, distal: 0.79. 2. D1: 0.88. 3. LCX: Proximal: 0.91, mid: 0.83, distal: 0.76.  4. RCA: 0.96.   IMPRESSION: 1. CT FFR analysis showed hemodynamically significant stenosis in the LAD and LCX arteries most probably secondary to anomalous course and dynamic compression in systole. Electronically Signed    By: Ena Dawley   On: 05/03/2019 21:39   Echocardiogram 06/02/2019: Left ventricle cavity is normal in size. Mild concentric hypertrophy of the left ventricle. Normal LV systolic function with EF 65%. Normal global wall motion. Normal diastolic filling pattern. Calculated EF 65%. 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 Mild (Grade I) mitral regurgitation. Redundant chordae seen. Mild tricuspid regurgitation.  No evidence of pulmonary hypertension. No significant change compared to prior hospital study on 12/30/2016.   Exercise tetrofosmin stress test  06/21/2019: Patient exercised for a total of 5 minutes and 30 seconds, achieving approximately 6.97 METs and  87% of maximum predicted heart rate. The baseline blood pressure was 180/120 mmHg and increased to 160/82 mmHg at peak exercise, which is a hypertensive response to exercise.  Normal perfusion. Stress LV EF: 56%.  No previous exam available for comparison. Low risk study.   EKG:  EKG 09/10/2021:  Normal sinus rhythm with rate of 83 bpm, left ankle enlargement, otherwise normal EKG.  No significant change from 12/02/2019.  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 Primary hypertension  I10 EKG 12-Lead    amLODipine (NORVASC) 5 MG tablet    CMP14+EGFR    3. Mild  hyperlipidemia  E78.5 Lipid Panel With LDL/HDL Ratio     Meds ordered this encounter  Medications   amLODipine (NORVASC) 5 MG tablet    Sig: Take 1 tablet (5 mg total) by mouth daily.    Dispense:  90 tablet    Refill:  3    Medications Discontinued During This Encounter  Medication Reason   finasteride (PROSCAR) 5 MG tablet    amLODipine (NORVASC) 5 MG tablet Reorder     Recommendations:   JUEL BELLEROSE  is a  61 y.o.  African-American male with hypertension, bronchial asthma, remote tobacco use, chronic dyspnea on exertion and chronic stable angina, family history of premature CAD with MI in father at age 66 Y. 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  RCA runs posteriorly between aortic root and left atrium in normal fashion and LAD runs anteriorly between aortic root and pulmonary artery. Due to malignant course of the LAD, exertional chest pain and dyspnea, he was recommended CABG of the LAD with reimplantation, however he prefers to wait and watch for now.   This is annual visit, is presently doing well, he has had very minimal anginal symptoms with exertion.  States that he has resumed all his physical activity.  I again reviewed his November 2020 exercise nuclear stress test which was low risk without inducible arrhythmias or significant ischemic change and a fairly decent exercise tolerance.  He continues to walk on a regular basis.  For now continue observation as he prefers not to have bypass surgery.  Blood pressure is well controlled.  I refilled his amlodipine.  With regard to hyperlipidemia, he needs lipid profile testing orders were placed today.  Otherwise stable from cardiac standpoint, I will see him back in a year.      Adrian Prows, MD, [facility] 09/10/2021, 3:27 PM Office: [phone]

## 2021-09-24 ENCOUNTER — Other Ambulatory Visit: Payer: Self-pay | Admitting: Cardiology

## 2021-09-24 DIAGNOSIS — I209 Angina pectoris, unspecified: Secondary | ICD-10-CM

## 2021-09-24 DIAGNOSIS — I10 Essential (primary) hypertension: Secondary | ICD-10-CM

## 2021-11-22 ENCOUNTER — Ambulatory Visit: Payer: Medicare HMO | Admitting: Allergy & Immunology

## 2021-11-27 ENCOUNTER — Encounter: Payer: Self-pay | Admitting: Allergy & Immunology

## 2021-11-27 ENCOUNTER — Ambulatory Visit: Payer: Medicare HMO | Admitting: Allergy & Immunology

## 2021-11-27 VITALS — BP 106/70 | HR 98 | Temp 98.4°F | Resp 16 | Ht 69.0 in | Wt 161.4 lb

## 2021-11-27 DIAGNOSIS — J4520 Mild intermittent asthma, uncomplicated: Secondary | ICD-10-CM

## 2021-11-27 DIAGNOSIS — J3089 Other allergic rhinitis: Secondary | ICD-10-CM

## 2021-11-27 DIAGNOSIS — K219 Gastro-esophageal reflux disease without esophagitis: Secondary | ICD-10-CM | POA: Diagnosis not present

## 2021-11-27 MED ORDER — FLUTICASONE PROPIONATE HFA 220 MCG/ACT IN AERO: 2.0000 | INHALATION_SPRAY | Freq: Two times a day (BID) | RESPIRATORY_TRACT | 5 refills | Status: DC

## 2021-11-27 NOTE — Progress Notes (Signed)
? ?FOLLOW UP ? ?Date of Service/Encounter:  11/27/21 ? ? ?Assessment:  ?  ?Mild intermittent asthma without complication ?  ?Allergic rhinitis ?  ?Gastroesophageal reflux disease ?  ?Food intolerance ? ?Plan/Recommendations:  ? ?1. Asthma ?- Lung testing looked excellent today.  ?- I think that you have a good handle on your symptoms.  ?- Daily controller medication(s): Flovent two puffs twice daily EVERY DAY from February through August only ?- Prior to physical activity: Xopenex 2 puffs 10-15 minutes before physical activity. ?- Rescue medications: Xopenex 4 puffs every 4-6 hours as needed ?- Changes during respiratory infections or worsening symptoms: Add on Flovent 2 puffs twice daily for TWO WEEKS. ?- Asthma control goals:  ?* Full participation in all desired activities (may need albuterol before activity) ?* Albuterol use two time or less a week on average (not counting use with activity) ?* Cough interfering with sleep two time or less a month ?* Oral steroids no more than once a year ?* No hospitalizations ? ?2.  Perennial and seasonal allergic rhinitis (grasses, dust mite) ?- Continue Flonase 1-2 sprays in each nostril once a day EVERY DAY for the best effect.  ?- Continue Xyzal once a day as needed for a runny nose. ? ?3. Reflux ?- Continue dietary and lifestyle modifications as listed below ?- Continue famotidine 20 mg twice a day as needed for control of reflux ? ?4. Return in about 6 months (around 05/29/2022).  ? ? ?Subjective:  ? ?Travis Bryant is a 61 y.o. male presenting today for follow up of  ?Chief Complaint  ?Patient presents with  ? Follow-up  ?  Asthma has been doing ok. A week ago has a problem with breathing and he used his inhaler and it got better.Not taking his inhaler every day like he should.  Allergies are doing ok at night has some congestion right before bed but nothing terrible.  ? ? ?Travis Bryant has a history of the following: ?Patient Active Problem List  ? Diagnosis Date  Noted  ? 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 Mild hyperlipidemia 09/10/2021  ? Mild intermittent asthma without complication 05/28/2019  ? Angina pectoris (HCC) 09/09/2018  ? Family history of early CAD 09/09/2018  ? Asthma with acute exacerbation 04/21/2017  ? Hypertensive disorder 01/02/2017  ? Benign essential HTN 12/16/2016  ? Dyspnea 12/16/2016  ? Memory impairment 11/06/2016  ? Disorder of bladder 08/22/2016  ? Injury due to exposure to external cause 08/22/2016  ? History of hematuria 08/22/2016  ? 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 Increased frequency of urination 08/22/2016  ? History of head injury 07/30/2016  ? Acute sinusitis 12/27/2015  ? Mild persistent asthma 12/27/2015  ? Seasonal and perennial allergic rhinitis 12/27/2015  ? Gastroesophageal reflux disease 12/27/2015  ? Cough, persistent 12/27/2015  ? Tendinitis of shoulder 08/29/2015  ? Benign prostatic hyperplasia without urinary obstruction 06/21/2015  ? History of tuberculosis 06/21/2015  ? Asthma 06/19/2015  ? Environmental allergies 06/19/2015  ? 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 Restless legs 01/14/2014  ? Memory change 01/25/2013  ? Head injury 12/12/2011  ? Migraine 12/12/2011  ? ? ?History obtained from: chart review and patient. ? ?Travis Bryant is a 61 y.o. male presenting for a follow up visit.  He was last seen in October 2022.  At that time, his lung testing looked fairly normal.  We continue with Flovent 2 puffs twice daily every day from February through June sensors were his worst  times a year.  We continue with Xopenex 4 puffs every 4-6 hours as needed.  For his rhinitis, we will continue with Flonase daily and Xyzal as needed.  Reflux was controlled with famotidine 20 mg twice daily as needed. ? ?Since last visit, he has done fairly well.  ? ?Asthma/Respiratory Symptom History: He forgot to use his Flovent consistently during that time of the year that we determined. He was previously using it as needed.   February through August is the worst time of the year for his breathing overall. He just needs to stay awasy from whree they are cutting grass or smoking. However, whatever he has been doing has worked.  He has been out of the hospital and has not been in the emergency room and has not needed steroids.  He feels like his activity level has been stellar and his symptoms been under good control. ? ?Allergic Rhinitis Symptom History: He did have some episode where he was sick for 3 weeks, but no antibiotics were required. He des have a nose spray but he is not a fan of using it. He has not needed, although he really wanted them during this most recent illness.  He is using his eyes all daily. ? ?GERD Symptom History: Heartburn is under fair control. He will take either the famotidine or he will use Tums.  He does not use any proton pump inhibitors.  He does not use anything on a daily basis. ? ?His sister [place] in [place]. He is going to there over the summer for a period of time.  Otherwise he does not have any major travel plans. ? ?Otherwise, there have been no changes to his past medical history, surgical history, family history, or social history. ? ? ? ?Review of Systems  ?Constitutional: Negative.  Negative for chills, fever, malaise/fatigue and weight loss.  ?HENT: Negative.  Negative for congestion, ear discharge, ear pain and sinus pain.   ?Eyes:  Negative for pain, discharge and redness.  ?Respiratory:  Negative for cough, sputum production, shortness of breath and wheezing.   ?Cardiovascular: Negative.  Negative for chest pain and palpitations.  ?Gastrointestinal:  Negative for abdominal pain, constipation, diarrhea, heartburn, nausea and vomiting.  ?Skin: Negative.  Negative for itching and rash.  ?Neurological:  Negative for dizziness and headaches.  ?Endo/Heme/Allergies:  Negative for environmental allergies. Does not bruise/bleed easily.   ? ? ? ?Objective:  ? ?Blood pressure 106/70, pulse 98,  temperature 98.4 ?F (36.9 ?C), temperature source Temporal, resp. rate 16, height 5\' 9"  (1.753 m), weight 161 lb 6.4 oz (73.2 kg), SpO2 98 %. ?Body mass index is 23.83 kg/m?. ? ? ? ?Physical Exam ?Vitals reviewed.  ?Constitutional:   ?   Appearance: He is well-developed.  ?   Comments: Pleasant male.  Cooperative with the exam.  ?HENT:  ?   Head: Normocephalic and atraumatic.  ?   Right Ear: Tympanic membrane, ear canal and external ear normal.  ?   Left Ear: Tympanic membrane, ear canal and external ear normal.  ?   Nose: No nasal deformity, septal deviation, mucosal edema or rhinorrhea.  ?   Right Turbinates: Enlarged, swollen and pale.  ?   Left Turbinates: Enlarged, swollen and pale.  ?   Right Sinus: No maxillary sinus tenderness or frontal sinus tenderness.  ?   Left Sinus: No maxillary sinus tenderness or frontal sinus tenderness.  ?   Mouth/Throat:  ?   Lips: Pink.  ?   Mouth:  Mucous membranes are moist. Mucous membranes are not pale and not dry.  ?   Pharynx: Uvula midline.  ?   Comments: Cobblestoning present in the posterior oropharynx. ?Eyes:  ?   General: Lids are normal. Allergic shiner present.     ?   Right eye: No discharge.     ?   Left eye: No discharge.  ?   Conjunctiva/sclera: Conjunctivae normal.  ?   Right eye: Right conjunctiva is not injected. No chemosis. ?   Left eye: Left conjunctiva is not injected. No chemosis. ?   Pupils: Pupils are equal, round, and reactive to light.  ?Cardiovascular:  ?   Rate and Rhythm: Normal rate and regular rhythm.  ?   Heart sounds: Normal heart sounds.  ?Pulmonary:  ?   Effort: Pulmonary effort is normal. No tachypnea, accessory muscle usage or respiratory distress.  ?   Breath sounds: Normal breath sounds. No wheezing, rhonchi or rales.  ?   Comments: No wheezing or crackles noted.  ?Chest:  ?   Chest wall: No tenderness.  ?Lymphadenopathy:  ?   Cervical: No cervical adenopathy.  ?Skin: ?   General: Skin is warm.  ?   Capillary Refill: Capillary refill takes  less than 2 seconds.  ?   Coloration: Skin is not pale.  ?   Findings: No abrasion, erythema, petechiae or rash. Rash is not papular, urticarial or vesicular.  ?   Comments: No eczematous or urticarial les

## 2021-11-27 NOTE — Patient Instructions (Addendum)
1. Asthma ?- Lung testing looked excellent today.  ?- I think that you have a good handle on your symptoms.  ?- Daily controller medication(s): Flovent two puffs twice daily EVERY DAY from February through August only ?- Prior to physical activity: Xopenex 2 puffs 10-15 minutes before physical activity. ?- Rescue medications: Xopenex 4 puffs every 4-6 hours as needed ?- Changes during respiratory infections or worsening symptoms: Add on Flovent 2 puffs twice daily for TWO WEEKS. ?- Asthma control goals:  ?* Full participation in all desired activities (may need albuterol before activity) ?* Albuterol use two time or less a week on average (not counting use with activity) ?* Cough interfering with sleep two time or less a month ?* Oral steroids no more than once a year ?* No hospitalizations ? ?2.  Perennial and seasonal allergic rhinitis (grasses, dust mite) ?- Continue Flonase 1-2 sprays in each nostril once a day EVERY DAY for the best effect.  ?- Continue Xyzal once a day as needed for a runny nose. ? ?3. Reflux ?- Continue dietary and lifestyle modifications as listed below ?- Continue famotidine 20 mg twice a day as needed for control of reflux ? ?4. Return in about 6 months (around 05/29/2022).  ? ? ?Please inform us of any Emergency Department visits, hospitalizations, or changes in symptoms. Call us before going to the ED for breathing or allergy symptoms since we might be able to fit you in for a sick visit. Feel free to contact us anytime with any questions, problems, or concerns. ? ?It was a pleasure to see you again today! ? ?Websites that have reliable patient information: ?1. American Academy of Asthma, Allergy, and Immunology: www.aaaai.org ?2. Food Allergy Research and Education (FARE): foodallergy.org ?3. Mothers of Asthmatics: http://www.asthmacommunitynetwork.org ?4. [employer] of Allergy, Asthma, and Immunology: MissingWeapons.ca ? ? ?COVID-19 Vaccine Information can be found at:  PodExchange.nl For questions related to vaccine distribution or appointments, please email [email or web address] or call [phone].  ? ?We realize that you might be concerned about having an allergic reaction to the COVID19 vaccines. To help with that concern, WE ARE OFFERING THE COVID19 VACCINES IN OUR OFFICE! Ask the front desk for dates!  ? ? ? ??Like? [place] on Facebook and Instagram for our latest updates!  ?  ? ? ?A healthy democracy works best when [employer] participate! Make sure you are registered to vote! If you have moved or changed any of your contact information, you will need to get this updated before voting! ? ?In some cases, you MAY be able to register to vote online: [email or web address] ? ? ? ? ?

## 2021-11-29 ENCOUNTER — Encounter: Payer: Self-pay | Admitting: Allergy & Immunology

## 2021-11-29 MED ORDER — FAMOTIDINE 20 MG PO TABS: 20.0000 mg | ORAL_TABLET | Freq: Every day | ORAL | 5 refills | Status: DC

## 2021-11-29 MED ORDER — IPRATROPIUM BROMIDE 0.03 % NA SOLN: 2.0000 | Freq: Two times a day (BID) | NASAL | 5 refills | Status: AC

## 2021-11-29 MED ORDER — FLUTICASONE PROPIONATE HFA 220 MCG/ACT IN AERO: 2.0000 | INHALATION_SPRAY | Freq: Two times a day (BID) | RESPIRATORY_TRACT | 5 refills | Status: DC

## 2021-11-29 MED ORDER — AZELASTINE HCL 0.1 % NA SOLN: 2.0000 | Freq: Two times a day (BID) | NASAL | 5 refills | Status: DC

## 2021-11-29 MED ORDER — LEVALBUTEROL TARTRATE 45 MCG/ACT IN AERO: 2.0000 | INHALATION_SPRAY | RESPIRATORY_TRACT | 1 refills | Status: DC | PRN

## 2021-11-29 MED ORDER — FLUTICASONE PROPIONATE 50 MCG/ACT NA SUSP: 1.0000 | Freq: Every day | NASAL | 5 refills | Status: DC | PRN

## 2021-11-29 MED ORDER — LEVOCETIRIZINE DIHYDROCHLORIDE 5 MG PO TABS: ORAL_TABLET | ORAL | 5 refills | Status: DC

## 2021-12-03 ENCOUNTER — Telehealth: Payer: Self-pay | Admitting: *Deleted

## 2021-12-03 NOTE — Telephone Encounter (Signed)
PA has been approved for Levalbuterol. PA has been sent electronically to patients pharmacy through [facility] and is good for one year.  ?

## 2021-12-03 NOTE — Telephone Encounter (Signed)
PA has been submitted through CoverMyMeds for Levalbuterol HFA and is currently pending approval/denial.  

## 2022-02-08 DIAGNOSIS — R202 Paresthesia of skin: Secondary | ICD-10-CM | POA: Insufficient documentation

## 2022-03-15 DIAGNOSIS — R200 Anesthesia of skin: Secondary | ICD-10-CM | POA: Insufficient documentation

## 2022-03-25 ENCOUNTER — Other Ambulatory Visit: Payer: Self-pay

## 2022-03-25 DIAGNOSIS — R202 Paresthesia of skin: Secondary | ICD-10-CM

## 2022-03-26 ENCOUNTER — Ambulatory Visit: Payer: Medicare HMO | Admitting: Neurology

## 2022-03-26 DIAGNOSIS — R202 Paresthesia of skin: Secondary | ICD-10-CM | POA: Diagnosis not present

## 2022-03-26 NOTE — Procedures (Signed)
[facility]  [address] [place], [address]  [place], [place] [address] Tel: [phone] Fax:  [phone] Test Date:  03/26/2022  Patient: Travis Bryant DOB: Nov 27, 1960 Physician: Nita Sickle  Sex: Male Height: 5\' 9"  Ref Phys: , MD  ID#: [id number] Addison   Technician:    Patient Complaints: This is a 61 year old man referred for evaluation of right arm paresthesias.  NCV & EMG Findings: Extensive electrodiagnostic testing of the right upper extremity shows:  Right median, ulnar, and mixed responses are within normal limits. Right median ulnar motor responses are within normal limits. Chronic motor axonal loss changes are seen affecting a pronator teres and triceps muscles, without accompanied active denervation.  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 There is no evidence of carpal tunnel syndrome or ulnar neuropathy affecting the right arm.   ___________________________ 68    Nerve Conduction Studies Anti Sensory Summary Table   Stim Site NR Peak (ms) Norm Peak (ms) P-T Amp (V) Norm P-T Amp  Right Median Anti Sensory (2nd Digit)  34C  Wrist    2.9 <3.8 41.1 >10  Right Ulnar Anti Sensory (5th Digit)  34C  Wrist    2.8 <3.2 29.2 >5   Motor Summary Table   Stim Site NR Onset (ms) Norm Onset (ms) O-P Amp (mV) Norm O-P Amp Site1 Site2 Delta-0 (ms) Dist (cm) Vel (m/s) Norm Vel (m/s)  Right Median Motor (Abd Poll Brev)  34C  Wrist    3.0 <4.0 11.4 >5 Elbow Wrist 5.1 31.0 61 >50  Elbow    8.1  11.0         Right Ulnar Motor (Abd Dig Minimi)  34C  Wrist    2.4 <3.1 10.2 >7 B Elbow Wrist 3.7 21.0 57 >50  B Elbow    6.1  9.5  A Elbow B Elbow 1.6 10.0 62 >50  A Elbow    7.7  9.4          Comparison Summary Table   Stim Site NR Peak (ms) Norm Peak (ms) P-T Amp (V) Site1 Site2 Delta-P (ms) Norm Delta (ms)  Right Median/Ulnar Palm Comparison (Wrist - 8cm)  34C  Median Palm    1.7 <2.2 33.9 Median Palm  Ulnar Palm 0.0   Ulnar Palm    1.7 <2.2 18.1       EMG   Side Muscle Ins Act Fibs Psw Fasc Number Recrt Dur Dur. Amp Amp. Poly Poly. Comment  Right 1stDorInt Nml Nml Nml Nml Nml Nml Nml Nml Nml Nml Nml Nml N/A  Right PronatorTeres Nml Nml Nml Nml 1- Rapid Some 1+ Some 1+ Some 1+ N/A  Right Biceps Nml Nml Nml Nml Nml Nml Nml Nml Nml Nml Nml Nml N/A  Right Triceps Nml Nml Nml Nml 1- Rapid Some 1+ Some 1+ Some 1+ N/A  Right Deltoid Nml Nml Nml Nml Nml Nml Nml Nml Nml Nml Nml Nml N/A      Waveforms:

## 2022-03-27 ENCOUNTER — Other Ambulatory Visit: Payer: Self-pay | Admitting: Student

## 2022-03-27 DIAGNOSIS — R202 Paresthesia of skin: Secondary | ICD-10-CM

## 2022-04-08 ENCOUNTER — Ambulatory Visit
Admission: RE | Admit: 2022-04-08 | Discharge: 2022-04-08 | Disposition: A | Discharge status: Home / Self Care | Payer: Medicare HMO | Source: Ambulatory Visit | Attending: Student | Admitting: Student

## 2022-04-08 DIAGNOSIS — R202 Paresthesia of skin: Secondary | ICD-10-CM

## 2022-04-08 MED ORDER — GADOBENATE DIMEGLUMINE 529 MG/ML IV SOLN
15.0000 mL | Freq: Once | INTRAVENOUS | Status: AC | PRN
  Administered 2022-04-08: 15 mL via INTRAVENOUS

## 2022-05-13 ENCOUNTER — Encounter (HOSPITAL_COMMUNITY): Payer: Self-pay

## 2022-05-13 ENCOUNTER — Ambulatory Visit (HOSPITAL_COMMUNITY)
Admission: EM | Admit: 2022-05-13 | Discharge: 2022-05-13 | Disposition: A | Discharge status: Home / Self Care | Payer: Medicare HMO | Attending: Physician Assistant | Admitting: Physician Assistant

## 2022-05-13 DIAGNOSIS — M5410 Radiculopathy, site unspecified: Secondary | ICD-10-CM | POA: Diagnosis not present

## 2022-05-13 DIAGNOSIS — M62830 Muscle spasm of back: Secondary | ICD-10-CM

## 2022-05-13 MED ORDER — KETOROLAC TROMETHAMINE 30 MG/ML IJ SOLN: 30.0000 mg | Freq: Once | INTRAMUSCULAR | Status: DC

## 2022-05-13 MED ORDER — KETOROLAC TROMETHAMINE 30 MG/ML IJ SOLN
INTRAMUSCULAR | Status: AC
  Filled 2022-05-13: qty 1

## 2022-05-13 MED ORDER — KETOROLAC TROMETHAMINE 30 MG/ML IJ SOLN
30.0000 mg | Freq: Once | INTRAMUSCULAR | Status: AC
  Administered 2022-05-13: 30 mg via INTRAMUSCULAR

## 2022-05-13 MED ORDER — CYCLOBENZAPRINE HCL 5 MG PO TABS: 5.0000 mg | ORAL_TABLET | Freq: Three times a day (TID) | ORAL | 0 refills | Status: DC | PRN

## 2022-05-13 MED ORDER — PREDNISONE 10 MG (21) PO TBPK: ORAL_TABLET | Freq: Every day | ORAL | 0 refills | Status: DC

## 2022-05-13 NOTE — ED Triage Notes (Signed)
Pt has pain on the left hip that, is radiating to lower abdomen x 2wks

## 2022-05-13 NOTE — Discharge Instructions (Signed)
Can take Flexeril as needed for muscle spasm every 8 hours.  Take prednisone as prescribed Follow up with Primary Care Physician

## 2022-05-13 NOTE — ED Provider Notes (Signed)
[facility]    CSN: [id number] Arrival date & time: 05/13/22  1922      History   Chief Complaint Chief Complaint  Patient presents with   Hip Pain    HPI INRI Travis Bryant is a 61 y.o. male.   Pt complains of left lower back pain and spasm with intermittent radiation to left posterior thigh to the knee.  He also reports left posterior thigh often cramps and feels like it's locking up.  He has taken nothing for the sx.  Denies injury or trauma.  He denies numbness, tingling, or saddle anesthesia.  Movement makes the sx worse.     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   Mild hyperlipidemia 09/10/2021   Mild intermittent asthma without complication 05/28/2019   Angina pectoris (HCC) 09/09/2018   Family history of early CAD 09/09/2018   Asthma with acute exacerbation 04/21/2017   Hypertensive disorder 01/02/2017   Benign essential HTN 12/16/2016   Dyspnea 12/16/2016   Memory impairment 11/06/2016   Disorder of bladder 08/22/2016   Injury due to exposure to external cause 08/22/2016   History of hematuria 08/22/2016   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/2016   Increased frequency of urination 08/22/2016   History of head injury 07/30/2016   Acute sinusitis 12/27/2015   Mild persistent asthma 12/27/2015   Seasonal and perennial allergic rhinitis 12/27/2015   Gastroesophageal reflux disease 12/27/2015   Cough, persistent 12/27/2015   Tendinitis of shoulder 08/29/2015   Benign prostatic hyperplasia without urinary obstruction 06/21/2015   History of  tuberculosis 06/21/2015   Asthma 06/19/2015   Environmental allergies 06/19/2015   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 Authorizing Provider  cyclobenzaprine (FLEXERIL) 5 MG tablet Take 1 tablet (5 mg total) by mouth 3 (three) times daily as needed for muscle spasms. 05/13/22  Yes Ward, Tylene Fantasia, PA-C  predniSONE (STERAPRED UNI-PAK 21 TAB) 10 MG (21) TBPK tablet Take by mouth daily. Take 6 tabs by mouth daily  for 2 days, then 5 tabs for 2 days, then 4 tabs for 2 days, then 3 tabs for 2 days, 2 tabs for 2 days, then 1 tab by mouth daily for 2 days 05/13/22  Yes Ward, Tylene Fantasia, PA-C  acetaminophen (TYLENOL) 325 MG tablet Take 650 mg by mouth every 6 (six) hours as needed (for pain).     [provider]  amLODipine (NORVASC) 5 MG tablet Take 1 tablet (5 mg total) by mouth daily. 09/10/21   Yates Decamp, MD  atorvastatin (LIPITOR) 10 MG tablet TAKE 1 TABLET(10 MG) BY MOUTH DAILY 12/09/19   Yates Decamp, MD  azelastine (  ASTELIN) 0.1 % nasal spray Place 2 sprays into both nostrils 2 (two) times daily. 11/29/21   Valentina Shaggy, MD  DESCOVY 200-25 MG tablet Take 1 tablet by mouth daily. 03/13/19   [provider]  famotidine (PEPCID) 20 MG tablet Take 1 tablet (20 mg total) by mouth daily. 11/29/21   Valentina Shaggy, MD  fluticasone [facility]) 50 MCG/ACT nasal spray Place 1-2 sprays into both nostrils daily as needed. 11/29/21   Valentina Shaggy, MD  fluticasone (FLOVENT HFA) 220 MCG/ACT inhaler Inhale 2 puffs into the lungs 2 (two) times daily. 11/29/21   Valentina Shaggy, MD  ipratropium (ATROVENT) 0.03 % nasal spray Place 2 sprays into both nostrils 2 (two) times daily.  11/29/21   Valentina Shaggy, MD  levalbuterol [facility] HFA) 45 MCG/ACT inhaler Inhale 2 puffs into the lungs every 4 (four) hours as needed. 11/29/21   Valentina Shaggy, MD  levocetirizine (XYZAL) 5 MG tablet TAKE 1 TABLET(5 MG) BY MOUTH EVERY EVENING 11/29/21   Valentina Shaggy, MD  metoprolol succinate (TOPROL-XL) 50 MG 24 hr tablet TAKE 1 TABLET(50 MG) BY MOUTH DAILY WITH OR IMMEDIATELY FOLLOWING A MEAL 09/24/21   Tolia, Sunit, DO  rOPINIRole (REQUIP) 0.25 MG tablet Take 0.25-0.5 mg by mouth at bedtime. 05/31/21   [provider]  tadalafil (CIALIS) 5 MG tablet Take 5 mg by mouth daily as needed.  04/15/17 11/30/27  [provider]  tamsulosin (FLOMAX) 0.4 MG CAPS capsule take 2 tabs by mouth daily 08/21/16   [provider]    Family History Family History  Problem Relation Age of Onset   Lupus Mother    Arthritis/Rheumatoid Mother    Hypertension Father    Heart disease Father    Sarcoidosis Sister    Cancer Maternal Grandmother    Heart disease Maternal Grandfather    Cancer - Prostate Paternal Grandfather    Allergic rhinitis Neg Hx    Angioedema Neg Hx    Asthma Neg Hx    Eczema Neg Hx    Immunodeficiency Neg Hx    Urticaria Neg Hx     Social History Social History   Tobacco Use   Smoking status: Former    Packs/day: 0.10    Years: 20.00    Total pack years: 2.00    Types: Cigarettes    Quit date: 10/11/2012    Years since quitting: 9.5   Smokeless tobacco: Never   Tobacco comments:    pt was social smoker smoked 1 a month  Vaping Use   Vaping Use: Never used  Substance Use Topics   Alcohol use: Yes    Alcohol/week: 0.0 standard drinks of alcohol    Comment: socially   Drug use: No     Allergies   Patient has no known allergies.   Review of Systems Review of Systems  Constitutional:  Negative for chills and fever.  HENT:  Negative for ear pain and sore throat.   Eyes:  Negative for pain and visual disturbance.   Respiratory:  Negative for cough and shortness of breath.   Cardiovascular:  Negative for chest pain and palpitations.  Gastrointestinal:  Negative for abdominal pain and vomiting.  Genitourinary:  Negative for dysuria and hematuria.  Musculoskeletal:  Positive for back pain and myalgias. Negative for arthralgias.  Skin:  Negative for color change and rash.  Neurological:  Negative for seizures and syncope.  All other systems reviewed and are negative.    Physical Exam  Triage Vital Signs ED Triage Vitals  Enc Vitals Group     BP 05/13/22 2010 135/72     Pulse Rate 05/13/22 2010 86     Resp 05/13/22 2010 16     Temp 05/13/22 2010 98.8 F (37.1 C)     Temp Source 05/13/22 2010 Oral     SpO2 05/13/22 2010 98 %     Weight 05/13/22 2008 160 lb (72.6 kg)     Height 05/13/22 2008 5\' 10"  (1.778 m)     Head Circumference --      Peak Flow --      Pain Score 05/13/22 2010 8     Pain Loc --      Pain Edu? --      Excl. in GC? --    No data found.  Updated Vital Signs BP 135/72 (BP Location: Right Arm)   Pulse 86   Temp 98.8 F (37.1 C) (Oral)   Resp 16   Ht 5\' 10"  (1.778 m)   Wt 160 lb (72.6 kg)   SpO2 98%   BMI 22.96 kg/m   Visual Acuity Right Eye Distance:   Left Eye Distance:   Bilateral Distance:    Right Eye Near:   Left Eye Near:    Bilateral Near:     Physical Exam Vitals and nursing note reviewed.  Constitutional:      General: He is not in acute distress.    Appearance: He is well-developed.  HENT:     Head: Normocephalic and atraumatic.  Eyes:     Conjunctiva/sclera: Conjunctivae normal.  Cardiovascular:     Rate and Rhythm: Normal rate and regular rhythm.     Heart sounds: No murmur heard. Pulmonary:     Effort: Pulmonary effort is normal. No respiratory distress.     Breath sounds: Normal breath sounds.  Abdominal:     Palpations: Abdomen is soft.     Tenderness: There is no abdominal tenderness.  Musculoskeletal:        General: No  swelling.     Cervical back: Neck supple.     Comments: Positive left SLR, ttp with spasm to left lumbar paraspinal musculature.  Tightness to the left hamstring noted with knee extension.  No groin or hip pain with internal and external rotation.   Skin:    General: Skin is warm and dry.     Capillary Refill: Capillary refill takes less than 2 seconds.  Neurological:     Mental Status: He is alert.  Psychiatric:        Mood and Affect: Mood normal.      UC Treatments / Results  Labs (all labs ordered are listed, but only abnormal results are displayed) Labs Reviewed - No data to display  EKG   Radiology No results found.  Procedures Procedures (including critical care time)  Medications Ordered in UC Medications  ketorolac (TORADOL) 30 MG/ML injection 30 mg (30 mg Intramuscular Given 05/13/22 2035)    Initial Impression / Assessment and Plan / UC Course  I have reviewed the triage vital signs and the nursing notes.  Pertinent labs & imaging results that were available during my care of the patient were reviewed by me and considered in my medical decision making (see chart for details).     Will treat lumbar spasm with radiculopathy. Flexeril prescribed to take as needed for muscle spasm.  Will prescribe course of steroids for radicular sx.  Supportive care discussed.  Return precautions discussed.  If no improvement advised follow up with Dr. Clelia Croft his PCP.  Final Clinical Impressions(s) / UC Diagnoses   Final diagnoses:  Spasm of muscle of lower back  Radiculopathy, unspecified spinal region     Discharge Instructions      Can take Flexeril as needed for muscle spasm every 8 hours.  Take prednisone as prescribed Follow up with Primary Care Physician    ED Prescriptions     Medication Sig Dispense Auth. Provider   cyclobenzaprine (FLEXERIL) 5 MG tablet Take 1 tablet (5 mg total) by mouth 3 (three) times daily as needed for muscle spasms. 30 tablet Ward,  Tylene Fantasia, PA-C   predniSONE (STERAPRED UNI-PAK 21 TAB) 10 MG (21) TBPK tablet Take by mouth daily. Take 6 tabs by mouth daily  for 2 days, then 5 tabs for 2 days, then 4 tabs for 2 days, then 3 tabs for 2 days, 2 tabs for 2 days, then 1 tab by mouth daily for 2 days 42 tablet Ward, Tylene Fantasia, PA-C      PDMP not reviewed this encounter.   Ward, Tylene Fantasia, PA-C 05/14/22 2023

## 2022-05-20 ENCOUNTER — Ambulatory Visit (HOSPITAL_COMMUNITY)
Admission: EM | Admit: 2022-05-20 | Discharge: 2022-05-20 | Disposition: A | Discharge status: Home / Self Care | Payer: Medicare HMO | Attending: Internal Medicine | Admitting: Internal Medicine

## 2022-05-20 ENCOUNTER — Encounter (HOSPITAL_COMMUNITY): Payer: Self-pay

## 2022-05-20 DIAGNOSIS — K2100 Gastro-esophageal reflux disease with esophagitis, without bleeding: Secondary | ICD-10-CM

## 2022-05-20 HISTORY — DX: Other forms of angina pectoris: I20.89

## 2022-05-20 MED ORDER — SUCRALFATE 1 GM/10ML PO SUSP: 1.0000 g | Freq: Three times a day (TID) | ORAL | 0 refills | Status: DC

## 2022-05-20 NOTE — ED Provider Notes (Signed)
[facility]    CSN: [id number] Arrival date & time: 05/20/22  1748      History   Chief Complaint Chief Complaint  Patient presents with   Chest Pain    HPI Travis Bryant is a 61 y.o. male comes to the urgent care with severe retrosternal chest pain which started a few days ago.  Patient has a history of gastroesophageal reflux disease and he was recently started on was recently started on tapering dose of prednisone about a week ago.  This was prescribed for hip pain.  Hip pain has improved and patient started experiencing burning retrosternal chest pain.  Pain is worse in the mornings when he wakes up out of bed.  No vomiting or diarrhea.  No change in bowel movements.  No nausea.  No pain on swallowing.  Patient has a history of gastroesophageal reflux disease managed with famotidine.  He does not take the famotidine regularly.   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   Mild hyperlipidemia 09/10/2021   Mild intermittent asthma without complication 05/28/2019   Angina pectoris (HCC) 09/09/2018   Family history of early CAD 09/09/2018   Asthma with acute exacerbation 04/21/2017   Hypertensive disorder 01/02/2017   Benign essential HTN 12/16/2016   Dyspnea 12/16/2016   Memory impairment 11/06/2016   Disorder of bladder 08/22/2016   Injury due to exposure to external cause 08/22/2016   History of hematuria 08/22/2016   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/2016   Increased frequency of urination 08/22/2016   History of head  injury 07/30/2016   Acute sinusitis 12/27/2015   Mild persistent asthma 12/27/2015   Seasonal and perennial allergic rhinitis 12/27/2015   Gastroesophageal reflux disease 12/27/2015   Cough, persistent 12/27/2015   Tendinitis of shoulder 08/29/2015   Benign prostatic hyperplasia without urinary obstruction 06/21/2015   History of tuberculosis 06/21/2015   Asthma 06/19/2015   Environmental allergies 06/19/2015   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 Authorizing Provider  sucralfate (CARAFATE) 1 GM/10ML suspension Take 10 mLs (1 g total) by mouth in the morning, at noon, and at bedtime. 05/20/22  Yes Jennea Rager, Britta Mccreedy, MD  acetaminophen (TYLENOL) 325 MG tablet Take 650 mg by mouth every 6 (six) hours as needed (for pain).     [provider]  amLODipine (NORVASC) 5 MG tablet Take 1 tablet (5 mg total) by mouth daily. 09/10/21   Yates Decamp, MD  atorvastatin (LIPITOR) 10 MG tablet TAKE 1 TABLET(10 MG) BY MOUTH DAILY 12/09/19   Yates Decamp, MD  azelastine (ASTELIN) 0.1 % nasal spray Place  2 sprays into both nostrils 2 (two) times daily. 11/29/21   Valentina Shaggy, MD  cyclobenzaprine (FLEXERIL) 5 MG tablet Take 1 tablet (5 mg total) by mouth 3 (three) times daily as needed for muscle spasms. 05/13/22   Ward, Lenise Arena, PA-C  DESCOVY 200-25 MG tablet Take 1 tablet by mouth daily. 03/13/19   [provider]  famotidine (PEPCID) 20 MG tablet Take 1 tablet (20 mg total) by mouth daily. 11/29/21   Valentina Shaggy, MD  fluticasone [facility]) 50 MCG/ACT nasal spray Place 1-2 sprays into both nostrils daily as needed. 11/29/21   Valentina Shaggy, MD  fluticasone (FLOVENT HFA) 220  MCG/ACT inhaler Inhale 2 puffs into the lungs 2 (two) times daily. 11/29/21   Valentina Shaggy, MD  ipratropium (ATROVENT) 0.03 % nasal spray Place 2 sprays into both nostrils 2 (two) times daily. 11/29/21   Valentina Shaggy, MD  levalbuterol [facility] HFA) 45 MCG/ACT inhaler Inhale 2 puffs into the lungs every 4 (four) hours as needed. 11/29/21   Valentina Shaggy, MD  levocetirizine (XYZAL) 5 MG tablet TAKE 1 TABLET(5 MG) BY MOUTH EVERY EVENING 11/29/21   Valentina Shaggy, MD  metoprolol succinate (TOPROL-XL) 50 MG 24 hr tablet TAKE 1 TABLET(50 MG) BY MOUTH DAILY WITH OR IMMEDIATELY FOLLOWING A MEAL 09/24/21   Tolia, Sunit, DO  predniSONE (STERAPRED UNI-PAK 21 TAB) 10 MG (21) TBPK tablet Take by mouth daily. Take 6 tabs by mouth daily  for 2 days, then 5 tabs for 2 days, then 4 tabs for 2 days, then 3 tabs for 2 days, 2 tabs for 2 days, then 1 tab by mouth daily for 2 days 05/13/22   Ward, Janett Billow Z, PA-C  rOPINIRole (REQUIP) 0.25 MG tablet Take 0.25-0.5 mg by mouth at bedtime. 05/31/21   [provider]  tadalafil (CIALIS) 5 MG tablet Take 5 mg by mouth daily as needed.  04/15/17 11/30/27  [provider]  tamsulosin (FLOMAX) 0.4 MG CAPS capsule take 2 tabs by mouth daily 08/21/16   [provider]    Family History Family History  Problem Relation Age of Onset   Lupus Mother    Arthritis/Rheumatoid Mother    Hypertension Father    Heart disease Father    Sarcoidosis Sister    Cancer Maternal Grandmother    Heart disease Maternal Grandfather    Cancer - Prostate Paternal Grandfather    Allergic rhinitis Neg Hx    Angioedema Neg Hx    Asthma Neg Hx    Eczema Neg Hx    Immunodeficiency Neg Hx    Urticaria Neg Hx     Social History Social History   Tobacco Use   Smoking status: Former    Packs/day: 0.10    Years: 20.00    Total pack years: 2.00    Types: Cigarettes    Quit date: 10/11/2012    Years since quitting: 9.6   Smokeless tobacco:  Never   Tobacco comments:    pt was social smoker smoked 1 a month  Vaping Use   Vaping Use: Never used  Substance Use Topics   Alcohol use: Yes    Alcohol/week: 0.0 standard drinks of alcohol    Comment: socially   Drug use: No     Allergies   Patient has no known allergies.   Review of Systems Review of Systems  Respiratory: Negative.    Cardiovascular: Negative.   Gastrointestinal:  Positive for abdominal pain. Negative for blood  in stool, diarrhea, nausea and vomiting.  Neurological: Negative.      Physical Exam Triage Vital Signs ED Triage Vitals  Enc Vitals Group     BP 05/20/22 1753 118/69     Pulse Rate 05/20/22 1753 81     Resp 05/20/22 1753 18     Temp 05/20/22 1753 98.4 F (36.9 C)     Temp Source 05/20/22 1753 Oral     SpO2 05/20/22 1753 98 %     Weight --      Height --      Head Circumference --      Peak Flow --      Pain Score 05/20/22 1759 5     Pain Loc --      Pain Edu? --      Excl. in GC? --    No data found.  Updated Vital Signs BP 118/69 (BP Location: Right Arm)   Pulse 81   Temp 98.4 F (36.9 C) (Oral)   Resp 18   SpO2 98%   Visual Acuity Right Eye Distance:   Left Eye Distance:   Bilateral Distance:    Right Eye Near:   Left Eye Near:    Bilateral Near:     Physical Exam Vitals and nursing note reviewed.  Constitutional:      General: He is not in acute distress.    Appearance: He is not ill-appearing.  Cardiovascular:     Rate and Rhythm: Normal rate and regular rhythm.     Heart sounds: Normal heart sounds.  Pulmonary:     Effort: Pulmonary effort is normal.     Breath sounds: Normal breath sounds.  Abdominal:     Palpations: Abdomen is soft.     Tenderness: There is abdominal tenderness. There is no guarding or rebound.  Musculoskeletal:        General: Normal range of motion.  Neurological:     Mental Status: He is alert.      UC Treatments / Results  Labs (all labs ordered are listed, but only  abnormal results are displayed) Labs Reviewed - No data to display  EKG   Radiology No results found.  Procedures Procedures (including critical care time)  Medications Ordered in UC Medications - No data to display  Initial Impression / Assessment and Plan / UC Course  I have reviewed the triage vital signs and the nursing notes.  Pertinent labs & imaging results that were available during my care of the patient were reviewed by me and considered in my medical decision making (see chart for details).     1.  Gastroesophageal reflux disease with esophagitis without hemorrhage: Patient is advised to take famotidine regularly Carafate twice daily whilst taking prednisone.  Patient is advised to take Carafate until retrosternal pain resolves Patient is advised to avoid NSAID use. Maintain adequate hydration Patient will complete prednisone in a couple of days.  I advised patient to stop taking prednisone but he would like to complete the course of prednisone. Return precautions given. Final Clinical Impressions(s) / UC Diagnoses   Final diagnoses:  Gastroesophageal reflux disease with esophagitis without hemorrhage     Discharge Instructions      Please take medications as prescribed Complete the course of prednisone You are pain is likely secondary to prednisone use Avoid ibuprofen or other anti-inflammatory medications.  You can take Tylenol for pain Return to urgent care if you have worsening symptoms.   ED Prescriptions  Medication Sig Dispense Auth. Provider   sucralfate (CARAFATE) 1 GM/10ML suspension Take 10 mLs (1 g total) by mouth in the morning, at noon, and at bedtime. 420 mL Kiondre Grenz, Britta Mccreedy, MD      PDMP not reviewed this encounter.   Merrilee Jansky, MD 05/20/22 [phone]

## 2022-05-20 NOTE — Discharge Instructions (Addendum)
Please take medications as prescribed Complete the course of prednisone You are pain is likely secondary to prednisone use Avoid ibuprofen or other anti-inflammatory medications.  You can take Tylenol for pain Return to urgent care if you have worsening symptoms.

## 2022-05-20 NOTE — ED Triage Notes (Signed)
Patient c/o midsternal chest pain that started last night. States it feels like pressure and some heart burn.

## 2022-05-21 ENCOUNTER — Telehealth: Payer: Self-pay

## 2022-05-21 NOTE — Telephone Encounter (Signed)
Patient called and states that he has been having the worst indigestion that he has ever had. He states that he thought the pain was from his heart. Patient states that he went to Urgent care and was told it was Indigestion and was prescribed Carafate. Patient states that he is feeling better but if his symptoms return he would give [place] a call.

## 2022-05-30 ENCOUNTER — Ambulatory Visit: Payer: Medicare HMO | Admitting: Allergy & Immunology

## 2022-06-04 ENCOUNTER — Ambulatory Visit (INDEPENDENT_AMBULATORY_CARE_PROVIDER_SITE_OTHER): Payer: Medicare HMO | Admitting: Allergy & Immunology

## 2022-06-04 ENCOUNTER — Encounter: Payer: Self-pay | Admitting: Allergy & Immunology

## 2022-06-04 VITALS — BP 114/68 | HR 102 | Temp 97.2°F | Resp 18 | Ht 70.0 in | Wt 162.4 lb

## 2022-06-04 DIAGNOSIS — J4520 Mild intermittent asthma, uncomplicated: Secondary | ICD-10-CM | POA: Diagnosis not present

## 2022-06-04 DIAGNOSIS — J3089 Other allergic rhinitis: Secondary | ICD-10-CM | POA: Diagnosis not present

## 2022-06-04 DIAGNOSIS — K219 Gastro-esophageal reflux disease without esophagitis: Secondary | ICD-10-CM | POA: Diagnosis not present

## 2022-06-04 NOTE — Progress Notes (Signed)
FOLLOW UP  Date of Service/Encounter:  06/04/22   Assessment:   Mild intermittent asthma without complication   Allergic rhinitis   Gastroesophageal reflux disease   Food intolerance  Plan/Recommendations:    1. Asthma - Lung testing looked excellent today.  - We are going to continue with the same medications.  - Daily controller medication(s): Flovent two puffs twice daily EVERY DAY from February through August only - Prior to physical activity: Xopenex 2 puffs 10-15 minutes before physical activity. - Rescue medications: Xopenex 4 puffs every 4-6 hours as needed - Changes during respiratory infections or worsening symptoms: Add on Flovent 2 puffs twice daily for TWO WEEKS. - Asthma control goals:  * Full participation in all desired activities (may need albuterol before activity) * Albuterol use two time or less a week on average (not counting use with activity) * Cough interfering with sleep two time or less a month * Oral steroids no more than once a year * No hospitalizations  2.  Perennial and seasonal allergic rhinitis (grasses, dust mite) - Continue Flonase 1-2 sprays in each nostril once a day EVERY DAY for the best effect.  - Continue Xyzal (levocetirizine) once a day as needed for a runny nose.  3. Reflux - Continue dietary and lifestyle modifications as listed below - Continue famotidine 20 mg twice a day as needed for control of reflux  4. Return in about 6 months (around 12/04/2022).    Subjective:   Travis Bryant is a 61 y.o. 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   Mild hyperlipidemia 09/10/2021   Mild intermittent asthma without complication 94/17/4081   Angina pectoris ([place]) 09/09/2018   Family history of early CAD 09/09/2018    Asthma with acute exacerbation 04/21/2017   Hypertensive disorder 01/02/2017   Benign essential HTN 12/16/2016   Dyspnea 12/16/2016   Memory impairment 11/06/2016   Disorder of bladder 08/22/2016   Injury due to exposure to external cause 08/22/2016   History of hematuria 08/22/2016   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/2016   Increased frequency of urination 08/22/2016   History of head injury 07/30/2016   Acute sinusitis 12/27/2015   Mild persistent asthma 12/27/2015   Seasonal and perennial allergic rhinitis 12/27/2015   Gastroesophageal reflux disease 12/27/2015   Cough, persistent 12/27/2015   Tendinitis of shoulder 08/29/2015   Benign prostatic hyperplasia without urinary obstruction 06/21/2015   History of tuberculosis 06/21/2015   Asthma 06/19/2015   Environmental allergies 06/19/2015   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  Travis Bryant is a 61 y.o. male presenting for a follow up visit.  He was last seen in April 2023.  At that time, his lung testing looked awesome.  We continued with Flovent 2 puffs twice daily every day from February through August.  We continue with Xopenex as needed.  For his allergic rhinitis, we continue with Flonase as well as Xyzal.  Reflux was controlled with famotidine 20 mg twice daily.  Since last visit, he has done very well.   Asthma/Respiratory Symptom History: He did have  a few days of coughing around two days ago. It went away on its own after using albuterol a few times. He uses the Flovent from February  through August 2023. He has not needed prednisone at all since the last visit. He did have some problems the other day with some back issues. He used this until he was told to stop using it. He had some issues with her GERD and he went to Urgent care. Normally he is fine during the  winter months.   Allergic Rhinitis Symptom History: He is doing the levocetirizine every day. He is not great about using the fluticasone every day.  He has not needed antibiotics at all.  He overall is send very well.  GERD Symptom History: He remains on the famotidine once daily, but he recently increased it to twice daily when he needs it more. He took two the other day.   He has not been doing a whole lot with his back problems. He starts off slow and then he gets out of the house around 2pm.  He did see neurology for paresthesias.  He has a history of benign prostatic hypertrophy which is followed by urology.  He also has a history of an anomalous coronary artery and hypertension and is followed by cardiology.  Otherwise, there have been no changes to his past medical history, surgical history, family history, or social history.    Review of Systems  Constitutional:  Negative for chills, fever, malaise/fatigue and weight loss.  HENT: Negative.  Negative for congestion, ear discharge and ear pain.   Eyes:  Negative for pain, discharge and redness.  Respiratory:  Negative for cough, sputum production, shortness of breath and wheezing.   Cardiovascular: Negative.  Negative for chest pain and palpitations.  Gastrointestinal:  Negative for abdominal pain, constipation, diarrhea, heartburn, nausea and vomiting.  Skin: Negative.  Negative for itching and rash.  Neurological:  Negative for dizziness and headaches.  Endo/Heme/Allergies:  Negative for environmental allergies. Does not bruise/bleed easily.       Objective:   Blood pressure 114/68, pulse (!) 102, temperature (!) 97.2 F (36.2 C), resp. rate 18, height 5\' 10"  (1.778 m), weight 162 lb 6.4 oz (73.7 kg), SpO2 98 %. Body mass index is 23.3 kg/m.    Physical Exam Vitals reviewed.  Constitutional:      Appearance: He is well-developed.     Comments: Pleasant male.  Cooperative with the exam.  HENT:     Head: Normocephalic  and atraumatic.     Right Ear: Tympanic membrane, ear canal and external ear normal.     Left Ear: Tympanic membrane, ear canal and external ear normal.     Nose: No nasal deformity, septal deviation, mucosal edema or rhinorrhea.     Right Turbinates: Enlarged, swollen and pale.     Left Turbinates: Enlarged, swollen and pale.     Right Sinus: No maxillary sinus tenderness or frontal sinus tenderness.     Left Sinus: No maxillary sinus tenderness or frontal sinus tenderness.     Comments: No nasal polyps noted.     Mouth/Throat:     Lips: Pink.     Mouth: Mucous membranes are moist. Mucous membranes are not pale and not dry.     Pharynx: Uvula midline.     Comments: Cobblestoning present in the posterior oropharynx. Eyes:     General: Lids are normal. Allergic shiner present.        Right eye: No discharge.  Left eye: No discharge.     Conjunctiva/sclera: Conjunctivae normal.     Right eye: Right conjunctiva is not injected. No chemosis.    Left eye: Left conjunctiva is not injected. No chemosis.    Pupils: Pupils are equal, round, and reactive to light.  Cardiovascular:     Rate and Rhythm: Normal rate and regular rhythm.     Heart sounds: Normal heart sounds.  Pulmonary:     Effort: Pulmonary effort is normal. No tachypnea, accessory muscle usage or respiratory distress.     Breath sounds: Normal breath sounds. No wheezing, rhonchi or rales.     Comments: No wheezing or crackles noted.  Chest:     Chest wall: No tenderness.  Lymphadenopathy:     Cervical: No cervical adenopathy.  Skin:    General: Skin is warm.     Capillary Refill: Capillary refill takes less than 2 seconds.     Coloration: Skin is not pale.     Findings: No abrasion, erythema, petechiae or rash. Rash is not papular, urticarial or vesicular.     Comments: No eczematous or urticarial lesions noted.   Neurological:     Mental Status: He is alert.  Psychiatric:        Behavior: Behavior is cooperative.       Diagnostic studies:    Spirometry: results normal (FEV1: 2.91/97%, FVC: 3.74/97%, FEV1/FVC: 78%).    Spirometry consistent with normal pattern.   Allergy Studies: none        Malachi Bonds, MD  [facility]

## 2022-06-04 NOTE — Patient Instructions (Addendum)
1. Asthma - Lung testing looked excellent today.  - We are going to continue with the same medications.  - Daily controller medication(s): Flovent two puffs twice daily EVERY DAY from February through August only   - Prior to physical activity: Xopenex 2 puffs 10-15 minutes before physical activity.   - Rescue medications: Xopenex 4 puffs every 4-6 hours as needed  - Changes during respiratory infections or worsening symptoms: Add on Flovent 2 puffs twice daily for TWO WEEKS.  - Asthma control goals:  * Full participation in all desired activities (may need albuterol before activity) * Albuterol use two time or less a week on average (not counting use with activity) * Cough interfering with sleep two time or less a month * Oral steroids no more than once a year * No hospitalizations  2.  Perennial and seasonal allergic rhinitis (grasses, dust mite) - Continue Flonase 1-2 sprays in each nostril once a day EVERY DAY for the best effect.  - Continue Xyzal (levocetirizine) once a day as needed for a runny nose.  3. Reflux - Continue dietary and lifestyle modifications as listed below - Continue famotidine 20 mg twice a day as needed for control of reflux  4. Return in about 6 months (around 12/04/2022).    Please inform us of any Emergency Department visits, hospitalizations, or changes in symptoms. Call us before going to the ED for breathing or allergy symptoms since we might be able to fit you in for a sick visit. Feel free to contact us anytime with any questions, problems, or concerns.  It was a pleasure to see you again today!  Websites that have reliable patient information: 1. American Academy of Asthma, Allergy, and Immunology: www.aaaai.org 2. Food Allergy Research and Education (FARE): foodallergy.org 3. Mothers of Asthmatics: http://www.asthmacommunitynetwork.org 4. American College of Allergy, Asthma, and Immunology: www.acaai.org   COVID-19 Vaccine Information can  be found at: ShippingScam.co.uk For questions related to vaccine distribution or appointments, please email [email or web address] or call [phone].   We realize that you might be concerned about having an allergic reaction to the COVID19 vaccines. To help with that concern, WE ARE OFFERING THE COVID19 VACCINES IN OUR OFFICE! Ask the front desk for dates!     "Like" [place] on Facebook and Instagram for our latest updates!      A healthy democracy works best when [employer] participate! Make sure you are registered to vote! If you have moved or changed any of your contact information, you will need to get this updated before voting!  In some cases, you MAY be able to register to vote online: [email or web address]

## 2022-09-11 ENCOUNTER — Encounter: Payer: Self-pay | Admitting: Cardiology

## 2022-09-11 ENCOUNTER — Ambulatory Visit: Payer: Medicare HMO | Admitting: Cardiology

## 2022-09-11 VITALS — BP 112/75 | HR 91 | Resp 16 | Ht 70.0 in | Wt 163.2 lb

## 2022-09-11 DIAGNOSIS — Q245 Malformation of coronary vessels: Secondary | ICD-10-CM

## 2022-09-11 DIAGNOSIS — I10 Essential (primary) hypertension: Secondary | ICD-10-CM

## 2022-09-11 NOTE — Progress Notes (Signed)
Primary Physician/Referring:  Cipriano Mile, NP  Patient ID: Travis Bryant, male    DOB: [birth year], 62 y.o.   MRN: [id number]  Chief Complaint  Patient presents with   Anomalous coronary artery   Chest Pain   Hypertension   Hyperlipidemia   Follow-up    1 year   HPI:    Travis Bryant  is a 62 y.o.  African-American male with hypertension, bronchial asthma, remote tobacco use, chronic dyspnea on exertion and chronic stable angina, family history of premature CAD with MI in father at age 37 Y. 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  RCA runs posteriorly between aortic root and left atrium in normal fashion and LAD runs anteriorly between aortic root and pulmonary artery. Due to malignant course of the LAD, exertional chest pain and dyspnea, he was recommended CABG of the LAD with reimplantation, however he preferred to do conservative management and continues to exercise with mild dyspnea but no angina or dizziness or syncope.  No dizziness or syncope.  He is presently tolerating all his medications, states that his blood pressure is normal, he continues to exercise by walking regularly until recently a few months ago he has discontinued walking as he developed severe neck pain and back pain.  He was evaluated by neurosurgery and recommended cervical spine surgery and possibly consider lumbar spine surgery in view of disc prolapse and spinal stenosis.  He has developed mild tingling and numbness in his right hand and weakness in his right leg.  Past Medical History:  Diagnosis Date   Angina at rest    Asthma    Benign enlargement of prostate    Bladder disease    Environmental allergies    Hematuria    History of closed head injury 1966   Involving the left brain   History of headache    HIV exposure    HTN (hypertension)    Memory change 01/25/2013   Restless legs syndrome  (RLS) 01/14/2014   Past Surgical History:  Procedure Laterality Date   ABDOMINAL SURGERY     BRAIN SURGERY     Social History   Tobacco Use   Smoking status: Former    Packs/day: 0.10    Years: 20.00    Total pack years: 2.00    Types: Cigarettes    Quit date: 10/11/2012    Years since quitting: 9.9   Smokeless tobacco: Never   Tobacco comments:    pt was social smoker smoked 1 a month  Substance Use Topics   Alcohol use: Yes    Alcohol/week: 0.0 standard drinks of alcohol    Comment: socially    Family History  Problem Relation Age of Onset   Lupus Mother    Arthritis/Rheumatoid Mother    Hypertension Father    Heart disease Father    Sarcoidosis Sister    Cancer Maternal Grandmother    Heart disease Maternal Grandfather    Cancer - Prostate Paternal Grandfather    Allergic rhinitis Neg Hx    Angioedema Neg Hx    Asthma Neg Hx    Eczema Neg Hx    Immunodeficiency Neg Hx    Urticaria Neg Hx     ROS  Review of Systems  Cardiovascular:  Negative for chest pain, dyspnea on exertion and leg swelling.   Objective      09/11/2022    2:40 PM 06/04/2022  2:12 PM 05/20/2022    5:53 PM  Vitals with BMI  Height 5\' 10"  5\' 10"    Weight 163 lbs 3 oz 162 lbs 6 oz   BMI 23.42 23.3   Systolic 112 114  Diastolic 75 68 69  Pulse 91 102 81    Blood pressure 112/75, pulse 91, resp. rate 16, height 5\' 10"  (1.778 m), weight 163 lb 3.2 oz (74 kg), SpO2 98 %. Body mass index is 23.42 kg/m.   Physical Exam Neck:     Vascular: No carotid bruit or JVD.  Cardiovascular:     Rate and Rhythm: Normal rate and regular rhythm.     Pulses: Intact distal pulses.     Heart sounds: Normal heart sounds. No murmur heard.    No gallop.  Pulmonary:     Effort: Pulmonary effort is normal.     Breath sounds: Normal breath sounds.  Abdominal:     General: Bowel sounds are normal.     Palpations: Abdomen is soft.  Musculoskeletal:     Right lower leg: No edema.     Left lower leg:  No edema.    Radiology: No results found.  Laboratory examination:   Labs 05/28/2019:  TSH normal.  LPA normal.  CBC normal, Hb 13.7/HCT 39.6, platelets 223, normal indicis.  Medications and allergies  Not on File    Current Outpatient Medications:    acetaminophen (TYLENOL) 325 MG tablet, Take 650 mg by mouth every 6 (six) hours as needed (for pain). , Disp: , Rfl:    amLODipine (NORVASC) 5 MG tablet, Take 1 tablet (5 mg total) by mouth daily., Disp: 90 tablet, Rfl: 3   atorvastatin (LIPITOR) 10 MG tablet, TAKE 1 TABLET(10 MG) BY MOUTH DAILY, Disp: 30 tablet, Rfl: 11   azelastine (ASTELIN) 0.1 % nasal spray, Place 2 sprays into both nostrils 2 (two) times daily., Disp: 30 mL, Rfl: 5   cyclobenzaprine (FLEXERIL) 5 MG tablet, Take 1 tablet (5 mg total) by mouth 3 (three) times daily as needed for muscle spasms., Disp: 30 tablet, Rfl: 0   DESCOVY 200-25 MG tablet, Take 1 tablet by mouth daily., Disp: , Rfl:    famotidine (PEPCID) 20 MG tablet, Take 1 tablet (20 mg total) by mouth daily., Disp: 30 tablet, Rfl: 5   fluticasone (FLONASE) 50 MCG/ACT nasal spray, Place 1-2 sprays into both nostrils daily as needed., Disp: 16 g, Rfl: 5   fluticasone (FLOVENT HFA) 220 MCG/ACT inhaler, Inhale 2 puffs into the lungs 2 (two) times daily., Disp: 12 g, Rfl: 5   ipratropium (ATROVENT) 0.03 % nasal spray, Place 2 sprays into both nostrils 2 (two) times daily., Disp: 30 mL, Rfl: 5   levalbuterol (XOPENEX HFA) 45 MCG/ACT inhaler, Inhale 2 puffs into the lungs every 4 (four) hours as needed., Disp: 1 each, Rfl: 1   levocetirizine (XYZAL) 5 MG tablet, TAKE 1 TABLET(5 MG) BY MOUTH EVERY EVENING, Disp: 30 tablet, Rfl: 5   metoprolol succinate (TOPROL-XL) 50 MG 24 hr tablet, TAKE 1 TABLET(50 MG) BY MOUTH DAILY WITH OR IMMEDIATELY FOLLOWING A MEAL, Disp: 90 tablet, Rfl: 3   rOPINIRole (REQUIP) 0.25 MG tablet, Take 0.25-0.5 mg by mouth at bedtime., Disp: , Rfl:    sucralfate (CARAFATE) 1 GM/10ML suspension,  Take 10 mLs (1 g total) by mouth in the morning, at noon, and at bedtime., Disp: 420 mL, Rfl: 0   tadalafil (CIALIS) 5 MG tablet, Take 5 mg by mouth daily as needed. , Disp: ,  Rfl:    tamsulosin (FLOMAX) 0.4 MG CAPS capsule, take 2 tabs by mouth daily, Disp: , Rfl:     Cardiac Studies:   Cardiac CTA and FFR analysis 04/22/2019:  1. Coronary calcium score of 12. This was 95 percentile for age and sex matched control.   2. 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 Both arteries are originating from a wide stem with ostia adjacent to each other.  RCA gives rise to LCX artery, PDA and PLA. LCX originates shortly after RCA origin and runs posteriorly between aortic root and left atrium and then continues in it's normal course.  Left coronary artery runs anteriorly in between aortic root and pulmonary artery.   3. There is minimal CAD in the proximal RCA and LAD. However, with anomalous LAD from right coronary sinus with anterior course, slit like origin and evidence for dynamic compression in systole a surgical consult should be considered. CT FFR will be submitted (not currently approved for this indication).   4. Moderately dilated pulmonary artery measuring 37 mm suggestive of pulmonary hypertension.     Electronically Signed   By: Ena Dawley   On: 04/27/2019 21:02  FFRct analysis was performed on the original cardiac CT angiogram dataset. Diagrammatic representation of the FFRct analysis is provided in a separate PDF document in PACS. This dictation was created using the PDF document and an interactive 3D model of the results. 3D model is not available in the EMR/PACS. Normal FFR range is >0.80.   1. LAD: Ostial: 0.99, proximal: 0.86, mid: 0.81, distal: 0.79. 2. D1: 0.88. 3. LCX: Proximal: 0.91, mid: 0.83, distal: 0.76.  4. RCA: 0.96.   IMPRESSION: 1. CT FFR analysis showed hemodynamically significant stenosis in  the LAD and LCX arteries most probably secondary to anomalous course and dynamic compression in systole. Electronically Signed    By: Ena Dawley   On: 05/03/2019 21:39   Echocardiogram 06/02/2019: Left ventricle cavity is normal in size. Mild concentric hypertrophy of the left ventricle. Normal LV systolic function with EF 65%. Normal global wall motion. Normal diastolic filling pattern. Calculated EF 65%. 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 Mild (Grade I) mitral regurgitation. Redundant chordae seen. Mild tricuspid regurgitation.  No evidence of pulmonary hypertension. No significant change compared to prior hospital study on 12/30/2016.   Exercise tetrofosmin stress test  06/21/2019: Patient exercised for a total of 5 minutes and 30 seconds, achieving approximately 6.97 METs and  87% of maximum predicted heart rate. The baseline blood pressure was 180/120 mmHg and increased to 160/82 mmHg at peak exercise, which is a hypertensive response to exercise.  Normal perfusion. Stress LV EF: 56%.  No previous exam available for comparison. Low risk study.   EKG:  EKG 09/11/2022: Normal sinus rhythm with rate of 92 bpm, normal EKG.  Compared to 09/10/2021, no change, previously heart rate was 87 bpm.   Assessment     ICD-10-CM   1. Anomalous coronary artery origin  Q24.5 EKG 12-Lead    2. Primary hypertension  I10      No orders of the defined types were placed in this encounter.   There are no discontinued medications.    Recommendations:   Travis Bryant  is a  62 y.o.  African-American male with hypertension, bronchial asthma, remote tobacco use, chronic dyspnea on exertion and chronic stable angina, family history of premature CAD with MI in  father at age 25 Y. 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  RCA runs posteriorly  between aortic root and left atrium in normal fashion and LAD runs anteriorly between aortic root and pulmonary artery. Due to malignant course of the LAD, exertional chest pain and dyspnea, he was recommended CABG of the LAD with reimplantation, however he preferred to do conservative management and continues to exercise with mild dyspnea but no angina or dizziness or syncope.  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  This was diagnosed in 2020, but has remained stable without any syncope or angina is part of routine exercise which she continues to walk on a regular basis.  Also he has had an exercise stress test in 2020 in which there was no inducible EKG changes or arrhythmias.  Hence we will continue conservative management.  Continue moderate dose beta-blocker therapy with metoprolol succinate 50 mg daily.  2. Primary hypertension With combination of amlodipine and beta-blocker therapy with metoprolol succinate, blood pressure is under excellent control.  I reviewed his labs, patient has had labs done by his PCP however I am not been able to get his labs, patient will upload them into [facility].  He has normal lipids, normal renal function and CBC at least 1.5 years ago.  Patient's main issue presently is severe neck pain due to disc prolapse and also lumbar degenerative disease and he has been having some tingling and numbness and weakness in his right arm and has also been walking with a limp.  Advised him that he should proceed with surgical intervention as recommended by his neurosurgeon and that he would not do well from cardiac standpoint.  I reassured him with regard to this and I would like to see him back in a year or sooner if problems.    Adrian Prows, MD, [facility] 09/11/2022, 3:50 PM Office: [phone]

## 2022-09-19 NOTE — Progress Notes (Signed)
Labs 08/14/2022:  Total cholesterol 153, triglycerides 116, HDL 56, LDL 78.  Non-HDL cholesterol 97.  BUN 15, creatinine 1.33, EGFR 60 mL, potassium 4.5, LFTs normal.  Hb 12.7/HCT 36.0, platelets 200.  Normal indicis.  TSH normal at 1.12.  BNP 15, normal.  Vitamin D 15.

## 2022-09-29 ENCOUNTER — Encounter (HOSPITAL_COMMUNITY): Payer: Self-pay

## 2022-09-29 ENCOUNTER — Ambulatory Visit (HOSPITAL_COMMUNITY)
Admission: EM | Admit: 2022-09-29 | Discharge: 2022-09-29 | Disposition: A | Discharge status: Home / Self Care | Payer: Medicare HMO

## 2022-09-29 DIAGNOSIS — M79605 Pain in left leg: Secondary | ICD-10-CM

## 2022-09-29 DIAGNOSIS — G8929 Other chronic pain: Secondary | ICD-10-CM

## 2022-09-29 DIAGNOSIS — M5442 Lumbago with sciatica, left side: Secondary | ICD-10-CM | POA: Diagnosis not present

## 2022-09-29 MED ORDER — NAPROXEN SODIUM 550 MG PO TABS: 550.0000 mg | ORAL_TABLET | Freq: Two times a day (BID) | ORAL | 0 refills | Status: DC

## 2022-09-29 MED ORDER — PREDNISONE 10 MG PO TABS: 10.0000 mg | ORAL_TABLET | Freq: Three times a day (TID) | ORAL | 0 refills | Status: DC

## 2022-09-29 NOTE — ED Provider Notes (Signed)
[facility]    CSN: [id number] Arrival date & time: 09/29/22  1333      History   Chief Complaint Chief Complaint  Patient presents with   Leg Pain    HPI Travis Bryant is a 62 y.o. male.   62 year old male presents with back and left leg pain.  Patient indicates for the past several weeks he has been having increasing left leg pain, tenderness, numbness or tingling, and burning sensation that travels from the hip all the way down to his foot.  Patient indicates he is seeing a back specialist and has had injections in his back for chronic lower back pain over the past 3 to 4 months.  He relates the injections have not been working for him.  He indicates he continues to have pain in the lower part of the back and has been diagnosed with herniated disc with nerve compression.  Patient indicates he was concerned about having venous insufficiency however the degree of pain that he has in the pattern may suggest more of a neuropathy type secondary to nerve compression.  He indicates that the pain is worse when he moves, bends, turns, sits for long periods of time.  Patient indicates he has taken some OTC medications without relief.  Patient does indicate that he has been advised to have surgery to help relieve the pain but he is reluctant to do so.  He does not have any fever or chills, no nausea or vomiting.   Leg Pain Associated symptoms: back pain (lower back)     Past Medical History:  Diagnosis Date   Angina at rest    Asthma    Benign enlargement of prostate    Bladder disease    Environmental allergies    Hematuria    History of closed head injury 1966   Involving the left brain   History of headache    HIV exposure    HTN (hypertension)    Memory change 01/25/2013   Restless legs syndrome (RLS) 01/14/2014    Patient Active Problem List   Diagnosis Date Noted   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   Mild hyperlipidemia 09/10/2021   Mild intermittent  asthma without complication [id number]   Angina pectoris ([place]) 09/09/2018   Family history of early CAD 09/09/2018   Asthma with acute exacerbation 04/21/2017   Hypertensive disorder 01/02/2017   Benign essential HTN 12/16/2016   Dyspnea 12/16/2016   Memory impairment 11/06/2016   Disorder of bladder 08/22/2016   Injury due to exposure to external cause 08/22/2016   History of hematuria 08/22/2016   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/2016   Increased frequency of urination 08/22/2016   History of head injury 07/30/2016   Acute sinusitis 12/27/2015   Mild persistent asthma 12/27/2015   Seasonal and perennial allergic rhinitis 12/27/2015   Gastroesophageal reflux disease 12/27/2015   Cough, persistent 12/27/2015   Tendinitis of shoulder 08/29/2015   Benign prostatic hyperplasia without urinary obstruction 06/21/2015   History of tuberculosis 06/21/2015   Asthma 06/19/2015   Environmental allergies 06/19/2015   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 Authorizing Provider  amLODipine (NORVASC) 5 MG tablet Take 1 tablet (5 mg total) by mouth daily. 09/10/21  Yes Adrian Prows, MD  atorvastatin (LIPITOR) 10 MG tablet TAKE 1 TABLET(10 MG) BY MOUTH DAILY 12/09/19  Yes Adrian Prows, MD  DESCOVY 200-25 MG tablet Take 1 tablet by mouth daily. 03/13/19  Yes [provider]  famotidine (PEPCID) 20 MG tablet Take 1 tablet (20 mg total) by mouth daily. 11/29/21  Yes Valentina Shaggy, MD  fluticasone [facility]) 50 MCG/ACT nasal spray Place 1-2 sprays into both nostrils daily as needed. 11/29/21  Yes Valentina Shaggy, MD  fluticasone (FLOVENT HFA) 220 MCG/ACT inhaler Inhale 2 puffs  into the lungs 2 (two) times daily. 11/29/21  Yes Valentina Shaggy, MD  ipratropium (ATROVENT) 0.03 % nasal spray Place 2 sprays into both nostrils 2 (two) times daily. 11/29/21  Yes Valentina Shaggy, MD  levocetirizine (XYZAL) 5 MG tablet TAKE 1 TABLET(5 MG) BY MOUTH EVERY EVENING 11/29/21  Yes Valentina Shaggy, MD  metoprolol succinate (TOPROL-XL) 50 MG 24 hr tablet TAKE 1 TABLET(50 MG) BY MOUTH DAILY WITH OR IMMEDIATELY FOLLOWING A MEAL 09/24/21  Yes Tolia, Sunit, DO  naproxen sodium (ANAPROX DS) 550 MG tablet Take 1 tablet (550 mg total) by mouth 2 (two) times daily with a meal. 09/29/22  Yes Nyoka Lint, PA-C  predniSONE (DELTASONE) 10 MG tablet Take 1 tablet (10 mg total) by mouth in the morning, at noon, and at bedtime. 09/29/22  Yes Nyoka Lint, PA-C  rOPINIRole (REQUIP) 0.25 MG tablet Take 0.25-0.5 mg by mouth at bedtime. 05/31/21  Yes [provider]  rOPINIRole (REQUIP) 0.5 MG tablet Take 0.5 mg by mouth at bedtime. 09/16/22  Yes [provider]  tadalafil (CIALIS) 5 MG tablet Take 5 mg by mouth daily as needed.  04/15/17 11/30/27 Yes [provider]  tamsulosin (FLOMAX) 0.4 MG CAPS capsule take 2 tabs by mouth daily 08/21/16  Yes [provider]  acetaminophen (TYLENOL) 325 MG tablet Take 650 mg by mouth every 6 (six) hours as needed (for pain).     [provider]  azelastine (ASTELIN) 0.1 % nasal spray Place 2 sprays into both nostrils 2 (two) times daily. 11/29/21   Valentina Shaggy, MD  cyclobenzaprine (FLEXERIL) 5 MG tablet Take 1 tablet (5 mg total) by mouth 3 (three) times daily as needed for muscle spasms. 05/13/22   Ward, Lenise Arena, PA-C  levalbuterol [facility] HFA) 45 MCG/ACT inhaler Inhale 2 puffs into the lungs every 4 (four) hours as needed. 11/29/21   Valentina Shaggy, MD  sucralfate (CARAFATE) 1 GM/10ML suspension Take 10 mLs (1 g total) by mouth in the morning, at noon, and at bedtime. 05/20/22   Lamptey, Myrene Galas, MD     Family History Family History  Problem Relation Age of Onset   Lupus Mother    Arthritis/Rheumatoid Mother    Hypertension Father    Heart disease Father    Sarcoidosis Sister    Cancer Maternal Grandmother    Heart disease Maternal Grandfather    Cancer - Prostate Paternal Grandfather    Allergic rhinitis Neg Hx    Angioedema Neg Hx    Asthma Neg Hx    Eczema Neg Hx    Immunodeficiency Neg Hx    Urticaria Neg Hx     Social History Social History   Tobacco Use   Smoking status: Former    Packs/day: 0.10    Years: 20.00    Total pack years: 2.00  Types: Cigarettes    Quit date: 10/11/2012    Years since quitting: 9.9   Smokeless tobacco: Never   Tobacco comments:    pt was social smoker smoked 1 a month  Vaping Use   Vaping Use: Never used  Substance Use Topics   Alcohol use: Yes    Alcohol/week: 0.0 standard drinks of alcohol    Comment: socially   Drug use: No     Allergies   Patient has no allergy information on record.   Review of Systems Review of Systems  Musculoskeletal:  Positive for back pain (lower back).     Physical Exam Triage Vital Signs ED Triage Vitals [09/29/22 1456]  Enc Vitals Group     BP 132/89     Pulse Rate (!) 110     Resp 18     Temp 98.3 F (36.8 C)     Temp Source Oral     SpO2 95 %     Weight      Height      Head Circumference      Peak Flow      Pain Score      Pain Loc      Pain Edu?      Excl. in [place]?    No data found.  Updated Vital Signs BP 132/89 (BP Location: Left Arm)   Pulse (!) 110   Temp 98.3 F (36.8 C) (Oral)   Resp 18   SpO2 95%   Visual Acuity Right Eye Distance:   Left Eye Distance:   Bilateral Distance:    Right Eye Near:   Left Eye Near:    Bilateral Near:     Physical Exam Constitutional:      Appearance: Normal appearance.  Musculoskeletal:       Back:     Comments: Back: Pain is palpated along the L5-S1 area without any unusual redness or swelling.  Range of motion  is limited with pain on flexion, extension and bending.  Pain is also palpated along the left hip area.  Negative straight leg raise left, no pain produced with dorsiflexion of the foot.  Strength is intact left leg.  There is no unusual swelling or redness of the left lower extremity.  Neurological:     Mental Status: He is alert.      UC Treatments / Results  Labs (all labs ordered are listed, but only abnormal results are displayed) Labs Reviewed - No data to display  EKG   Radiology No results found.  Procedures Procedures (including critical care time)  Medications Ordered in UC Medications - No data to display  Initial Impression / Assessment and Plan / UC Course  I have reviewed the triage vital signs and the nursing notes.  Pertinent labs & imaging results that were available during my care of the patient were reviewed by me and considered in my medical decision making (see chart for details).    Plan: The diagnosis will be treated with the following: 1.  Low back pain with sciatica: A.  Naprosyn 550 mg every 12 hours with food to help reduce pain. B.  Prednisone 10 mg 3 times a day for 5 days only. 2.  Left leg pain: A.  Naprosyn 550 mg every 12 hours with food to reduce pain. B.  Prednisone 10 mg 3 times a day for 5 days only. 3.  Patient advised to follow-up with his back specialist for further evaluation of his back pain and  neuropathy.  Patient also will consider seeing a chiropractor to see if manipulation may help relieve the pain also. 4.  Patient to return to urgent care as needed. Final Clinical Impressions(s) / UC Diagnoses   Final diagnoses:  Left leg pain  Chronic left-sided low back pain with left-sided sciatica     Discharge Instructions      Advised take the Anaprox DS every 12 hours with food to help reduce pain and swelling. Advised take prednisone 10 mg 3 times a day for 5 days only to help reduce the acute inflammatory  process.  Advised to follow-up with your orthopedic back specialist for further evaluation of the pain and discomfort.  Advised to return to urgent care as needed.    ED Prescriptions     Medication Sig Dispense Auth. Provider   naproxen sodium (ANAPROX DS) 550 MG tablet Take 1 tablet (550 mg total) by mouth 2 (two) times daily with a meal. 20 tablet Nyoka Lint, PA-C   predniSONE (DELTASONE) 10 MG tablet Take 1 tablet (10 mg total) by mouth in the morning, at noon, and at bedtime. 15 tablet Nyoka Lint, PA-C      PDMP not reviewed this encounter.   Nyoka Lint, PA-C 09/29/22 1546

## 2022-09-29 NOTE — Discharge Instructions (Signed)
Advised take the Anaprox DS every 12 hours with food to help reduce pain and swelling. Advised take prednisone 10 mg 3 times a day for 5 days only to help reduce the acute inflammatory process.  Advised to follow-up with your orthopedic back specialist for further evaluation of the pain and discomfort.  Advised to return to urgent care as needed.

## 2022-09-29 NOTE — ED Triage Notes (Signed)
Here for left leg pain that started yesterday. Pt reports pain started at his knee cap and radiates down to his left calf muscle. Pt reports his leg is burning.

## 2022-10-06 ENCOUNTER — Other Ambulatory Visit: Payer: Self-pay | Admitting: Allergy & Immunology

## 2022-10-10 ENCOUNTER — Other Ambulatory Visit: Payer: Self-pay | Admitting: Cardiology

## 2022-10-10 DIAGNOSIS — I209 Angina pectoris, unspecified: Secondary | ICD-10-CM

## 2022-10-10 DIAGNOSIS — I10 Essential (primary) hypertension: Secondary | ICD-10-CM

## 2022-10-31 ENCOUNTER — Telehealth: Payer: Self-pay

## 2022-10-31 NOTE — Telephone Encounter (Signed)
If short term like a week or so it is fine

## 2022-10-31 NOTE — Telephone Encounter (Signed)
Patient was prescribed ibuprofen 800 mg, 1 tablet daily by his pcp he wants to know if you are ok with him taking this

## 2022-11-01 NOTE — Telephone Encounter (Signed)
Patient aware.

## 2022-11-12 ENCOUNTER — Other Ambulatory Visit: Payer: Self-pay | Admitting: Cardiology

## 2022-11-12 DIAGNOSIS — I10 Essential (primary) hypertension: Secondary | ICD-10-CM

## 2022-11-27 ENCOUNTER — Telehealth: Payer: Self-pay | Admitting: Neurosurgery

## 2022-11-27 NOTE — Telephone Encounter (Signed)
You can schedule with Dr. Jeannie Fend since he does have severe spinal stenosis.

## 2022-11-27 NOTE — Telephone Encounter (Signed)
Patient came by the office requesting an appt with Dr.Yarbrough. notes from [facility] has been scanned into his chart. He did have injections. I left him a message to ask about PT.  Please review if he needs to see PA or Dr.Y?  MRI Cervical 04/08/2022 IMPRESSION: 1. 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 2. Moderate spinal stenosis at C6-7 and mild-to-moderate spinal stenosis at C5-6. 3. Severe multilevel neural foraminal stenosis as above.

## 2022-11-28 NOTE — Telephone Encounter (Signed)
Left message to call back  

## 2022-11-28 NOTE — Telephone Encounter (Signed)
Patient confirmed for 12/05/2022. 

## 2022-12-03 ENCOUNTER — Encounter: Payer: Self-pay | Admitting: Allergy & Immunology

## 2022-12-03 ENCOUNTER — Other Ambulatory Visit: Payer: Self-pay

## 2022-12-03 ENCOUNTER — Ambulatory Visit: Payer: Medicare HMO | Admitting: Allergy & Immunology

## 2022-12-03 VITALS — BP 108/68 | HR 76 | Temp 98.2°F | Resp 18 | Ht 70.0 in | Wt 165.3 lb

## 2022-12-03 DIAGNOSIS — K219 Gastro-esophageal reflux disease without esophagitis: Secondary | ICD-10-CM

## 2022-12-03 DIAGNOSIS — J3089 Other allergic rhinitis: Secondary | ICD-10-CM

## 2022-12-03 DIAGNOSIS — J4520 Mild intermittent asthma, uncomplicated: Secondary | ICD-10-CM

## 2022-12-03 MED ORDER — OMEPRAZOLE 40 MG PO CPDR: 40.0000 mg | DELAYED_RELEASE_CAPSULE | Freq: Every day | ORAL | 1 refills | Status: AC

## 2022-12-03 MED ORDER — PREDNISONE 10 MG PO TABS: 30.0000 mg | ORAL_TABLET | Freq: Two times a day (BID) | ORAL | 0 refills | Status: AC

## 2022-12-03 NOTE — Patient Instructions (Addendum)
1. Asthma - Lung testing looked excellent today.  - We are going to continue with the same medications.  - Daily controller medication(s): Flovent two puffs twice daily EVERY DAY from February through August only   - Prior to physical activity: Xopenex 2 puffs 10-15 minutes before physical activity.   - Rescue medications: Xopenex 4 puffs every 4-6 hours as needed  - Changes during respiratory infections or worsening symptoms: Add on Flovent 2 puffs twice daily for TWO WEEKS.  - Asthma control goals:  * Full participation in all desired activities (may need albuterol before activity) * Albuterol use two time or less a week on average (not counting use with activity) * Cough interfering with sleep two time or less a month * Oral steroids no more than once a year * No hospitalizations  2.  Perennial and seasonal allergic rhinitis (grasses, dust mite) - Continue Flonase 1-2 sprays in each nostril once a day EVERY DAY for the best effect.  - Continue Xyzal (levocetirizine) once a day as needed for a runny nose.  3. Reflux - Continue dietary and lifestyle modifications as listed below. - Continue famotidine 20 mg twice a day as needed for control of reflux. - ADD ON omeprazole  daily to help with the reflux symptoms (this can work with the Pepcid to help your reflux symptoms).   4. Return in about 6 months (around 06/04/2023).    Please inform us of any Emergency Department visits, hospitalizations, or changes in symptoms. Call us before going to the ED for breathing or allergy symptoms since we might be able to fit you in for a sick visit. Feel free to contact us anytime with any questions, problems, or concerns.  It was a pleasure to see you again today!  Websites that have reliable patient information: 1. American Academy of Asthma, Allergy, and Immunology: www.aaaai.org 2. Food Allergy Research and Education (FARE): foodallergy.org 3. Mothers of Asthmatics:  http://www.asthmacommunitynetwork.org 4. American College of Allergy, Asthma, and Immunology: www.acaai.org   COVID-19 Vaccine Information can be found at: [email or web address] For questions related to vaccine distribution or appointments, please email [email or web address] or call [phone].   We realize that you might be concerned about having an allergic reaction to the COVID19 vaccines. To help with that concern, WE ARE OFFERING THE COVID19 VACCINES IN OUR OFFICE! Ask the front desk for dates!     "Like" [place] on Facebook and Instagram for our latest updates!      A healthy democracy works best when [employer] participate! Make sure you are registered to vote! If you have moved or changed any of your contact information, you will need to get this updated before voting!  In some cases, you MAY be able to register to vote online: [email or web address]

## 2022-12-03 NOTE — Progress Notes (Unsigned)
FOLLOW UP  Date of Service/Encounter:  12/03/22   Assessment:   Mild intermittent asthma without complication   Allergic rhinitis   Gastroesophageal reflux disease   Food intolerance  Plan/Recommendations:    There are no Patient Instructions on file for this visit.   Subjective:   Travis Bryant is a 62 y.o. male presenting today for follow up of  Chief Complaint  Patient presents with   Asthma    If he doesn't use his inhaler over a long period of time he may have some breathing issues.    Dark Spot    Noticed a dark spot on his face about 2 weeks ago.     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   Mild hyperlipidemia 09/10/2021   Mild intermittent asthma without complication 05/28/2019   Angina pectoris 09/09/2018   Family history of early CAD 09/09/2018   Asthma with acute exacerbation 04/21/2017   Hypertensive disorder 01/02/2017   Benign essential HTN 12/16/2016   Dyspnea 12/16/2016   Memory impairment 11/06/2016   Disorder of bladder 08/22/2016   Injury due to exposure to external cause 08/22/2016   History of hematuria 08/22/2016   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/2016   Increased frequency of urination 08/22/2016   History of head injury 07/30/2016   Acute sinusitis 12/27/2015   Mild persistent asthma 12/27/2015   Seasonal and perennial allergic rhinitis 12/27/2015   Gastroesophageal reflux disease 12/27/2015   Cough, persistent 12/27/2015   Tendinitis of shoulder 08/29/2015   Benign prostatic hyperplasia without urinary obstruction 06/21/2015   History of tuberculosis 06/21/2015   Asthma 06/19/2015   Environmental allergies 06/19/2015   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  Travis Bryant is a 62 y.o. male presenting for {Blank single:19197::"a food challenge","a drug challenge","skin testing","a sick visit","an evaluation of ***","a follow up visit"}.  He was last seen in October 2023.  At that time, Testing .  We continue with Flovent 2 puffs twice daily every day from February through August only.  For his rhinitis, we continue with Flonase as well as Xyzal.  For his reflux, would continue with Pepcid twice daily.  Since last visit,  Asthma/Respiratory Symptom History: He is not using his inhaler routinely.  He has mostly done well. He does notice that if he does for long period of time without his inhaler, he goes several days without using it. He is not having any problems right now with his breathing. He thinks that he has SOB episodes once per month. Winter was not bad for his breathing. His biggest problem is chronic back pain.   {Blank single:19197::"Allergic Rhinitis Symptom History: ***"," "}  {Blank single:19197::"Food Allergy Symptom History: ***"," "}  Skin Symptom History: He has some blotches on his face that popped up two weeks ago.   GERD Symptom History: Heartburn is under good control with the use of famotidine BID.  He has not been doing a whole lot with his back problems. He starts off slow and then he gets out of the house around 2pm.  He did see neurology for paresthesias.  He has a history of benign prostatic hypertrophy which is followed by urology.  He also has a history of  an anomalous coronary artery and hypertension and is followed by cardiology.  He has a history of chronic back pain. He is going to see Dr. Marcell Barlow soon to manage this. He was told that he has as herniated disc. He has been getting acupuncture to avoid needing a surgery. He reports that the prednisone helped with his back pain.   He is on gabapentin which he takes for paresthesias and pain. This was prescribed by his PCP to manage  this.     Otherwise, there have been no changes to his past medical history, surgical history, family history, or social history.    ROS     Objective:   There were no vitals taken for this visit. There is no height or weight on file to calculate BMI.    Physical Exam   Diagnostic studies:    Spirometry: results normal (FEV1: 3.14/106%, FVC: 3.92/102%, FEV1/FVC: 80%).    Spirometry consistent with normal pattern.   Allergy Studies: none        Travis Bonds, MD  [facility]

## 2022-12-04 ENCOUNTER — Encounter: Payer: Self-pay | Admitting: Allergy & Immunology

## 2022-12-04 MED ORDER — FLUTICASONE PROPIONATE HFA 220 MCG/ACT IN AERO: 2.0000 | INHALATION_SPRAY | Freq: Two times a day (BID) | RESPIRATORY_TRACT | 5 refills | Status: AC

## 2022-12-04 MED ORDER — LEVALBUTEROL TARTRATE 45 MCG/ACT IN AERO: 2.0000 | INHALATION_SPRAY | RESPIRATORY_TRACT | 1 refills | Status: DC | PRN

## 2022-12-04 MED ORDER — FLUTICASONE PROPIONATE 50 MCG/ACT NA SUSP: 1.0000 | Freq: Every day | NASAL | 5 refills | Status: AC | PRN

## 2022-12-04 MED ORDER — AZELASTINE HCL 0.1 % NA SOLN: 2.0000 | Freq: Two times a day (BID) | NASAL | 5 refills | Status: AC

## 2022-12-04 MED ORDER — FAMOTIDINE 20 MG PO TABS: 20.0000 mg | ORAL_TABLET | Freq: Every day | ORAL | 5 refills | Status: AC
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
## 2022-12-05 ENCOUNTER — Encounter: Payer: Self-pay | Admitting: Neurosurgery

## 2022-12-05 ENCOUNTER — Ambulatory Visit: Payer: Medicare HMO | Admitting: Neurosurgery

## 2022-12-05 VITALS — BP 126/80 | Ht 70.0 in | Wt 166.0 lb

## 2022-12-05 DIAGNOSIS — G959 Disease of spinal cord, unspecified: Secondary | ICD-10-CM

## 2022-12-05 DIAGNOSIS — M4802 Spinal stenosis, cervical region: Secondary | ICD-10-CM | POA: Diagnosis not present

## 2022-12-05 MED ORDER — CYCLOBENZAPRINE HCL 5 MG PO TABS: 5.0000 mg | ORAL_TABLET | Freq: Three times a day (TID) | ORAL | 0 refills | Status: DC | PRN

## 2022-12-05 NOTE — Patient Instructions (Signed)
Please see below for information in regards to your upcoming surgery:   Planned surgery: C3-4 anterior cervical discectomy and fusion   Surgery date: 12/30/22 - you will find out your arrival time the business day before your surgery.   Pre-op appointment at [facility] Pre-admit Testing: we will call you with a date/time for this. Pre-admit testing is located on the first floor of the [facility], [address] [facility] [address]. Please bring all prescriptions in the original prescription bottles to your appointment, even if you have reviewed medications by phone with a pharmacy representative. Pre-op labs may be done at your pre-op appointment. You are not required to fast for these labs. Should you need to change your pre-op appointment, please call Pre-admit testing at [phone].    Surgical clearance: we will send a clearance form to [name], NP     NSAIDS (Non-steroidal anti-inflammatory drugs): because you are having a fusion, no NSAIDS (such as ibuprofen, aleve, naproxen, meloxicam, diclofenac) for 3 months after surgery. Celebrex is an exception. Tylenol is ok because it is not an NSAID.    Home health physical therapy: [name] will contact you regarding home health physical therapy for after surgery.Their number is [phone].   Because you are having a fusion or arthroplasty: for appointments after your 2 week follow-up: please arrive at the [facility] outpatient imaging center ([address], [place]) or [facility] one hour prior to your appointment for x-rays. This applies to every appointment after your 2 week follow-up. Failure to do so may result in your appointment being rescheduled.   If you have FMLA/disability paperwork, please drop it off or fax it to [phone], attention Patty.   We can be reached by phone or mychart 8am-4pm, Monday-Friday. If you have any  questions/concerns before or after surgery, you can reach [place] at [phone], or you can send a mychart message. If you have a concern after hours that cannot wait until normal business hours, you can call [phone] and ask to page the neurosurgeon on call for [place].    Appointments/FMLA & disability paperwork: Patty & Cristin  Nurse: Royston Cowper  Medical assistants: Laurann Montana Physician Assistant's: Manning Charity & Drake Leach Surgeon: Venetia Night, MD

## 2022-12-06 ENCOUNTER — Telehealth: Payer: Self-pay

## 2022-12-06 ENCOUNTER — Other Ambulatory Visit: Payer: Self-pay

## 2022-12-06 ENCOUNTER — Other Ambulatory Visit (HOSPITAL_COMMUNITY): Payer: Self-pay

## 2022-12-06 DIAGNOSIS — Z01818 Encounter for other preprocedural examination: Secondary | ICD-10-CM

## 2022-12-06 MED ORDER — LEVALBUTEROL TARTRATE 45 MCG/ACT IN AERO: 2.0000 | INHALATION_SPRAY | RESPIRATORY_TRACT | 1 refills | Status: AC | PRN

## 2022-12-06 MED ORDER — ARNUITY ELLIPTA 200 MCG/ACT IN AEPB: 1.0000 | INHALATION_SPRAY | Freq: Every day | RESPIRATORY_TRACT | 5 refills | Status: AC

## 2022-12-06 NOTE — Telephone Encounter (Signed)
PA request received via fax from patients pharmacy for Levalbuterol [facility]   *preferred medication is the albuterol hfa  PA has not been submitted and this time

## 2022-12-06 NOTE — Telephone Encounter (Signed)
Yes that is fine - two puffs every 4 hours as needed.   Malachi Bonds, MD [facility]

## 2022-12-06 NOTE — Telephone Encounter (Signed)
We can do Arnuity one puff once daily.  Malachi Bonds, MD [facility]

## 2022-12-06 NOTE — Telephone Encounter (Signed)
PA requested via pharmacy fax for Fluticasone HFA .  Pending possible med change of preferred Arnuity.

## 2022-12-06 NOTE — Telephone Encounter (Signed)
Arnuity one puff once daily has been  sent in to the patient's pharmacy. Patient has been notified and plans to pick up inhaler from the pharmacy.

## 2022-12-06 NOTE — Telephone Encounter (Signed)
Called and spoke to patient and informed him of the medication change. Patient verbalized understanding.

## 2022-12-06 NOTE — Addendum Note (Signed)
Addended by: Robet Leu A on: 12/06/2022 04:47 PM   Modules accepted: Orders

## 2022-12-06 NOTE — Telephone Encounter (Signed)
Okay to send Levalbuterol in place of albuterol?

## 2022-12-09 NOTE — Telephone Encounter (Signed)
Travis Bryant, Sorry about the confusion, patient has tried and failed Albuterol, Proventil, and Ventolin.

## 2022-12-10 ENCOUNTER — Other Ambulatory Visit (HOSPITAL_COMMUNITY): Payer: Self-pay

## 2022-12-10 ENCOUNTER — Telehealth: Payer: Self-pay

## 2022-12-10 NOTE — Telephone Encounter (Signed)
Patient Advocate Encounter  Prior Authorization for Levalbuterol Tartrate 45MCG/ACT aerosol has been approved through [place].  Key: [id number]    Effective: 12-10-2022 to 08-11-2023

## 2022-12-10 NOTE — Telephone Encounter (Signed)
Patient Advocate Encounter   Received notification from [facility] that prior authorization is required for Levalbuterol Tartrate 45MCG/ACT aerosol   Submitted: 12-10-2022 Key [id number]  Status is pending

## 2022-12-10 NOTE — Telephone Encounter (Signed)
Called pharmacy and notified them of PA approval for medication.

## 2022-12-11 NOTE — Telephone Encounter (Signed)
PA has been APPROVED from 12/10/2022-08/11/2023

## 2022-12-12 ENCOUNTER — Telehealth: Payer: Self-pay | Admitting: Neurosurgery

## 2022-12-12 NOTE — Telephone Encounter (Signed)
C3-4 ACDF on 12/30/22//xrays today   Patient wants to postpone surgery for right now. He is trying acupuncture at the moment. He will call when he is ready to schedule surgery.

## 2022-12-12 NOTE — Telephone Encounter (Signed)
Noted. Notified OR, PAT, reps, monitoring, and Dr Myer Haff. Also canceled post op appointments.

## 2022-12-18 ENCOUNTER — Other Ambulatory Visit: Payer: Medicare HMO

## 2022-12-26 ENCOUNTER — Encounter: Payer: Self-pay | Admitting: Podiatry

## 2022-12-26 ENCOUNTER — Ambulatory Visit: Payer: Medicare HMO

## 2022-12-26 ENCOUNTER — Ambulatory Visit: Payer: Medicare HMO | Admitting: Podiatry

## 2022-12-26 DIAGNOSIS — M2170 Unequal limb length (acquired), unspecified site: Secondary | ICD-10-CM | POA: Diagnosis not present

## 2022-12-26 DIAGNOSIS — M79676 Pain in unspecified toe(s): Secondary | ICD-10-CM | POA: Diagnosis not present

## 2022-12-26 DIAGNOSIS — B351 Tinea unguium: Secondary | ICD-10-CM

## 2022-12-26 DIAGNOSIS — M778 Other enthesopathies, not elsewhere classified: Secondary | ICD-10-CM

## 2022-12-26 NOTE — Telephone Encounter (Signed)
Pt called in today stating that pain is staying pretty persistent, he would like to reschedule his surgery.   CB:[phone]

## 2022-12-27 ENCOUNTER — Other Ambulatory Visit: Payer: Self-pay | Admitting: Allergy & Immunology

## 2022-12-27 NOTE — Progress Notes (Signed)
Subjective:  Patient ID: Travis Bryant, male    DOB: 1960/10/08,  MRN: [id number] HPI Chief Complaint  Patient presents with   Foot Orthotics    Lower back pain due to bulging disc - interested in orthotics - back doc recommended surgery, but wanted to try inserts first, also right leg shorter than left-gets right shoe sole built up, would like toenails cut as well   New Patient (Initial Visit)    62 y.o. male presents with the above complaint.   ROS: Denies fever chills nausea vomit muscle aches pains calf pain chest pain shortness of breath.  Does relate pain in his legs and calf possibly associated with his back.  He is seeing Dr. Marcell Barlow in [facility] for a microdiscectomy as he explains it.  Past Medical History:  Diagnosis Date   Angina at rest    Asthma    Benign enlargement of prostate    Bladder disease    Environmental allergies    Hematuria    History of closed head injury 1966   Involving the left brain   History of headache    HIV exposure    HTN (hypertension)    Memory change 01/25/2013   Restless legs syndrome (RLS) 01/14/2014   Past Surgical History:  Procedure Laterality Date   ABDOMINAL SURGERY     BRAIN SURGERY     CIRCUMCISION     SMALL INTESTINE SURGERY      Current Outpatient Medications:    acetaminophen (TYLENOL) 325 MG tablet, Take 650 mg by mouth every 6 (six) hours as needed (for pain). , Disp: , Rfl:    amLODipine (NORVASC) 5 MG tablet, TAKE 1 TABLET(5 MG) BY MOUTH DAILY, Disp: 90 tablet, Rfl: 3   atorvastatin (LIPITOR) 10 MG tablet, TAKE 1 TABLET(10 MG) BY MOUTH DAILY, Disp: 30 tablet, Rfl: 11   azelastine (ASTELIN) 0.1 % nasal spray, Place 2 sprays into both nostrils 2 (two) times daily., Disp: 30 mL, Rfl: 5   cyclobenzaprine (FLEXERIL) 5 MG tablet, Take 1 tablet (5 mg total) by mouth 3 (three) times daily as needed for muscle spasms., Disp: 30 tablet, Rfl: 0   DESCOVY 200-25 MG tablet, Take 1 tablet by mouth daily., Disp: , Rfl:     famotidine (PEPCID) 20 MG tablet, Take 1 tablet (20 mg total) by mouth daily., Disp: 30 tablet, Rfl: 5   fluticasone (FLONASE) 50 MCG/ACT nasal spray, Place 1-2 sprays into both nostrils daily as needed., Disp: 16 g, Rfl: 5   fluticasone (FLOVENT HFA) 220 MCG/ACT inhaler, Inhale 2 puffs into the lungs 2 (two) times daily., Disp: 12 g, Rfl: 5   Fluticasone Furoate (ARNUITY ELLIPTA) 200 MCG/ACT AEPB, Inhale 1 puff into the lungs daily., Disp: 14 each, Rfl: 5   gabapentin (NEURONTIN) 300 MG capsule, Take 300 mg by mouth every 12 (twelve) hours., Disp: , Rfl:    ibuprofen (ADVIL) 800 MG tablet, Take 800 mg by mouth 2 (two) times daily as needed., Disp: , Rfl:    ipratropium (ATROVENT) 0.03 % nasal spray, Place 2 sprays into both nostrils 2 (two) times daily., Disp: 30 mL, Rfl: 5   levalbuterol (XOPENEX HFA) 45 MCG/ACT inhaler, Inhale 2 puffs into the lungs every 4 (four) hours as needed., Disp: 15 g, Rfl: 1   levocetirizine (XYZAL) 5 MG tablet, TAKE 1 TABLET(5 MG) BY MOUTH EVERY EVENING, Disp: 30 tablet, Rfl: 2   metoprolol succinate (TOPROL-XL) 50 MG 24 hr tablet, TAKE 1 TABLET(50 MG) BY MOUTH DAILY WITH  OR IMMEDIATELY FOLLOWING A MEAL, Disp: 90 tablet, Rfl: 3   naproxen sodium (ANAPROX DS) 550 MG tablet, Take 1 tablet (550 mg total) by mouth 2 (two) times daily with a meal., Disp: 20 tablet, Rfl: 0   omeprazole (PRILOSEC) 40 MG capsule, Take 1 capsule (40 mg total) by mouth daily., Disp: 90 capsule, Rfl: 1   rOPINIRole (REQUIP) 0.5 MG tablet, Take 0.5 mg by mouth at bedtime., Disp: , Rfl:    tadalafil (CIALIS) 5 MG tablet, Take 5 mg by mouth daily as needed. , Disp: , Rfl:    tamsulosin (FLOMAX) 0.4 MG CAPS capsule, take 2 tabs by mouth daily, Disp: , Rfl:   No Known Allergies Review of Systems Objective:  There were no vitals filed for this visit.  General: Well developed, nourished, in no acute distress, alert and oriented x3   Dermatological: Skin is warm, dry and supple bilateral. Nails x 10  are long thick yellow dystrophic clinically mycotic poorly tended to.; remaining integument appears unremarkable at this time. There are no open sores, no preulcerative lesions, no rash or signs of infection present.  Vascular: Dorsalis Pedis artery and Posterior Tibial artery pedal pulses are 2/4 bilateral with immedate capillary fill time. Pedal hair growth present. No varicosities and no lower extremity edema present bilateral.   Neruologic: Grossly intact via light touch bilateral. Vibratory intact via tuning fork bilateral. Protective threshold with Semmes Wienstein monofilament intact to all pedal sites bilateral. Patellar and Achilles deep tendon reflexes 2+ bilateral. No Babinski or clonus noted bilateral.   Musculoskeletal: No gross boney pedal deformities bilateral. No pain, crepitus, or limitation noted with foot and ankle range of motion bilateral. Muscular strength 5/5 in all groups tested bilateral.  Gait: Unassisted, Nonantalgic.    Radiographs:  None taken  Assessment & Plan:   Assessment: Dystrophic mycotic nails  Plan: Debridement of nails 1 through 5 bilateral     Travis Bryant T. [place], [place]

## 2022-12-30 ENCOUNTER — Ambulatory Visit: Admit: 2022-12-30 | Payer: Medicare HMO | Admitting: Neurosurgery

## 2022-12-30 ENCOUNTER — Other Ambulatory Visit: Payer: Self-pay

## 2022-12-30 DIAGNOSIS — Z01818 Encounter for other preprocedural examination: Secondary | ICD-10-CM

## 2022-12-30 SURGERY — ANTERIOR CERVICAL DECOMPRESSION/DISCECTOMY FUSION 1 LEVEL
Anesthesia: General

## 2022-12-30 NOTE — Telephone Encounter (Signed)
I spoke with Travis Bryant. He would like to reschedule surgery for 01/13/23. He confirmed that he has not changed any medications since he saw [place] in April.

## 2023-01-07 ENCOUNTER — Inpatient Hospital Stay: Admission: RE | Admit: 2023-01-07 | Payer: Medicare HMO | Source: Ambulatory Visit

## 2023-01-08 ENCOUNTER — Telehealth: Payer: Self-pay

## 2023-01-08 ENCOUNTER — Encounter
Admission: RE | Admit: 2023-01-08 | Discharge: 2023-01-08 | Disposition: A | Discharge status: Home / Self Care | Payer: Medicare HMO | Source: Ambulatory Visit | Attending: Neurosurgery | Admitting: Neurosurgery

## 2023-01-08 DIAGNOSIS — E785 Hyperlipidemia, unspecified: Secondary | ICD-10-CM | POA: Diagnosis not present

## 2023-01-08 DIAGNOSIS — Z01812 Encounter for preprocedural laboratory examination: Secondary | ICD-10-CM | POA: Diagnosis not present

## 2023-01-08 DIAGNOSIS — I10 Essential (primary) hypertension: Secondary | ICD-10-CM | POA: Diagnosis not present

## 2023-01-08 HISTORY — DX: Gastro-esophageal reflux disease without esophagitis: K21.9

## 2023-01-08 HISTORY — DX: Benign prostatic hyperplasia with lower urinary tract symptoms: N13.8

## 2023-01-08 HISTORY — DX: Disease of spinal cord, unspecified: G95.9

## 2023-01-08 HISTORY — DX: Other obstructive and reflux uropathy: N40.1

## 2023-01-08 HISTORY — DX: Other seasonal allergic rhinitis: J30.2

## 2023-01-08 HISTORY — DX: Male erectile dysfunction, unspecified: N52.9

## 2023-01-08 HISTORY — DX: Hyperlipidemia, unspecified: E78.5

## 2023-01-08 HISTORY — DX: Other seasonal allergic rhinitis: J30.89

## 2023-01-08 HISTORY — DX: Respiratory tuberculosis unspecified: A15.9

## 2023-01-08 LAB — CBC
HCT: 37.6 % — ABNORMAL LOW (ref 39.0–52.0)
Hemoglobin: 12.5 g/dL — ABNORMAL LOW (ref 13.0–17.0)
MCH: 31.3 pg (ref 26.0–34.0)
MCHC: 33.2 g/dL (ref 30.0–36.0)
MCV: 94 fL (ref 80.0–100.0)
Platelets: 197 10*3/uL (ref 150–400)
RBC: 4 MIL/uL — ABNORMAL LOW (ref 4.22–5.81)
RDW: 12.4 % (ref 11.5–15.5)
WBC: 3.2 10*3/uL — ABNORMAL LOW (ref 4.0–10.5)
nRBC: 0 % (ref 0.0–0.2)

## 2023-01-08 LAB — BASIC METABOLIC PANEL
Anion gap: 6 (ref 5–15)
BUN: 15 mg/dL (ref 8–23)
CO2: 22 mmol/L (ref 22–32)
Calcium: 8.6 mg/dL — ABNORMAL LOW (ref 8.9–10.3)
Chloride: 111 mmol/L (ref 98–111)
Creatinine, Ser: 1.21 mg/dL (ref 0.61–1.24)
GFR, Estimated: >60 mL/min (ref >60)
Glucose, Bld: 101 mg/dL — ABNORMAL HIGH (ref 70–99)
Potassium: 4.1 mmol/L (ref 3.5–5.1)
Sodium: 139 mmol/L (ref 135–145)

## 2023-01-08 LAB — TYPE AND SCREEN
ABO/RH(D): O POS
Antibody Screen: NEGATIVE

## 2023-01-08 LAB — SURGICAL PCR SCREEN
MRSA, PCR: NEGATIVE
Staphylococcus aureus: NEGATIVE

## 2023-01-08 NOTE — Patient Instructions (Addendum)
Your procedure is scheduled on: Monday, June 3 Report to the Registration Desk on the 1st floor of the [facility] To find out your arrival time, please call [phone] between 1PM - 3PM on: Friday, May 31 If your arrival time is 6:00 am, do not arrive before that time as the [facility] entrance doors do not open until 6:00 am.  REMEMBER: Instructions that are not followed completely may result in serious medical risk, up to and including death; or upon the discretion of your surgeon and anesthesiologist your surgery may need to be rescheduled.  Do not eat food after midnight the night before surgery.  No gum chewing or hard candies.  You may however, drink CLEAR liquids up to 2 hours before you are scheduled to arrive for your surgery. Do not drink anything within 2 hours of your scheduled arrival time.  Clear liquids include: - water  - apple juice without pulp - gatorade (not RED colors) - black coffee or tea (Do NOT add milk or creamers to the coffee or tea) Do NOT drink anything that is not on this list.  One week prior to surgery: starting today, May 29 Stop ANY OVER THE COUNTER supplements until after surgery. You may however, continue to take Tylenol if needed for pain up until the day of surgery.  Continue taking all prescribed medications except:  Tadalafil (Cialis) - hold for 2 days before surgery.  TAKE ONLY THESE MEDICATIONS THE MORNING OF SURGERY WITH A SIP OF WATER:  Amlodipine Atorvastatin (lipitor) Descovy Famotidine - (take one the night before and one on the morning of surgery - helps to prevent nausea after surgery.) Fluticasone ellipta inhaler Gabapentin Metoprolol Tamsulosin (flomax)  Use inhalers on the day of surgery and bring to the hospital.  No Alcohol for 24 hours before or after surgery.  No Smoking including e-cigarettes for 24 hours before surgery.  No chewable tobacco products for at least 6 hours before surgery.  No nicotine  patches on the day of surgery.  Do not use any "recreational" drugs for at least a week (preferably 2 weeks) before your surgery.  Please be advised that the combination of cocaine and anesthesia may have negative outcomes, up to and including death. If you test positive for cocaine, your surgery will be cancelled.  On the morning of surgery brush your teeth with toothpaste and water, you may rinse your mouth with mouthwash if you wish. Do not swallow any toothpaste or mouthwash.  Use CHG Soap as directed on instruction sheet.  Do not wear jewelry, make-up, hairpins, clips or nail polish.  Do not wear lotions, powders, or perfumes.   Do not shave body hair from the neck down 48 hours before surgery.  Contact lenses, hearing aids and dentures may not be worn into surgery.  Do not bring valuables to the hospital. [facility] is not responsible for any missing/lost belongings or valuables.   Notify your doctor if there is any change in your medical condition (cold, fever, infection).  Wear comfortable clothing (specific to your surgery type) to the hospital.  After surgery, you can help prevent lung complications by doing breathing exercises.  Take deep breaths and cough every 1-2 hours. Your doctor may order a device called an Incentive Spirometer to help you take deep breaths.  If you are being admitted to the hospital overnight, leave your suitcase in the car. After surgery it may be brought to your room.  In case of increased patient census,  it may be necessary for you, the patient, to continue your postoperative care in the Same Day Surgery department.  If you are being discharged the day of surgery, you will not be allowed to drive home. You will need a responsible individual to drive you home and stay with you for 24 hours after surgery.   If you are taking public transportation, you will need to have a responsible individual with you.  Please call the Pre-admissions Testing  Dept. at [phone] if you have any questions about these instructions.  Surgery Visitation Policy:  Patients having surgery or a procedure may have two visitors.  Children under the age of 57 must have an adult with them who is not the patient.  Inpatient Visitation:    Visiting hours are 7 a.m. to 8 p.m. Up to four visitors are allowed at one time in a patient room. The visitors may rotate out with other people during the day.  One visitor age 19 or older may stay with the patient overnight and must be in the room by 8 p.m.    Pre-operative 5 CHG Bath Instructions   You can play a key role in reducing the risk of infection after surgery. Your skin needs to be as free of germs as possible. You can reduce the number of germs on your skin by washing with CHG (chlorhexidine gluconate) soap before surgery. CHG is an antiseptic soap that kills germs and continues to kill germs even after washing.   DO NOT use if you have an allergy to chlorhexidine/CHG or antibacterial soaps. If your skin becomes reddened or irritated, stop using the CHG and notify one of our RNs at [phone].   Please shower with the CHG soap starting 4 days before surgery using the following schedule:     Please keep in mind the following:  DO NOT shave, including legs and underarms, starting the day of your first shower.   You may shave your face at any point before/day of surgery.  Place clean sheets on your bed the day you start using CHG soap. Use a clean washcloth (not used since being washed) for each shower. DO NOT sleep with pets once you start using the CHG.   CHG Shower Instructions:  If you choose to wash your hair and private area, wash first with your normal shampoo/soap.  After you use shampoo/soap, rinse your hair and body thoroughly to remove shampoo/soap residue.  Turn the water OFF and apply about 3 tablespoons (45 ml) of CHG soap to a CLEAN washcloth.  Apply CHG soap ONLY FROM YOUR NECK  DOWN TO YOUR TOES (washing for 3-5 minutes)  DO NOT use CHG soap on face, private areas, open wounds, or sores.  Pay special attention to the area where your surgery is being performed.  If you are having back surgery, having someone wash your back for you may be helpful. Wait 2 minutes after CHG soap is applied, then you may rinse off the CHG soap.  Pat dry with a clean towel  Put on clean clothes/pajamas   If you choose to wear lotion, please use ONLY the CHG-compatible lotions on the back of this paper.     Additional instructions for the day of surgery: DO NOT APPLY any lotions, deodorants, cologne, or perfumes.   Put on clean/comfortable clothes.  Brush your teeth.  Ask your nurse before applying any prescription medications to the skin.      CHG Compatible Lotions   Aveeno  Moisturizing lotion  Cetaphil Moisturizing Cream  Cetaphil Moisturizing Lotion  Clairol Herbal Essence Moisturizing Lotion, Dry Skin  Clairol Herbal Essence Moisturizing Lotion, Extra Dry Skin  Clairol Herbal Essence Moisturizing Lotion, Normal Skin  Curel Age Defying Therapeutic Moisturizing Lotion with Alpha Hydroxy  Curel Extreme Care Body Lotion  Curel Soothing Hands Moisturizing Hand Lotion  Curel Therapeutic Moisturizing Cream, Fragrance-Free  Curel Therapeutic Moisturizing Lotion, Fragrance-Free  Curel Therapeutic Moisturizing Lotion, Original Formula  Eucerin Daily Replenishing Lotion  Eucerin Dry Skin Therapy Plus Alpha Hydroxy Crme  Eucerin Dry Skin Therapy Plus Alpha Hydroxy Lotion  Eucerin Original Crme  Eucerin Original Lotion  Eucerin Plus Crme Eucerin Plus Lotion  Eucerin TriLipid Replenishing Lotion  Keri Anti-Bacterial Hand Lotion  Keri Deep Conditioning Original Lotion Dry Skin Formula Softly Scented  Keri Deep Conditioning Original Lotion, Fragrance Free Sensitive Skin Formula  Keri Lotion Fast Absorbing Fragrance Free Sensitive Skin Formula  Keri Lotion Fast Absorbing Softly  Scented Dry Skin Formula  Keri Original Lotion  Keri Skin Renewal Lotion Keri Silky Smooth Lotion  Keri Silky Smooth Sensitive Skin Lotion  Nivea Body Creamy Conditioning Oil  Nivea Body Extra Enriched Teacher, adult education Moisturizing Lotion Nivea Crme  Nivea Skin Firming Lotion  NutraDerm 30 Skin Lotion  NutraDerm Skin Lotion  NutraDerm Therapeutic Skin Cream  NutraDerm Therapeutic Skin Lotion  ProShield Protective Hand Cream  Provon moisturizing lotion

## 2023-01-08 NOTE — Telephone Encounter (Signed)
Received secure chat from [place], [place] with pre-admit testing, that Mr Haizlip has requested a phone call from our office regarding questions prior to surgery.  I spoke with Mr Batdorf. He reports pain in low back, his groin, leg, toe. He inquired if this is related to his neck condition. We discussed that this is a separate issue that we can evaluate once he recovers from his neck surgery.   He inquired about traveling after his neck surgery. We discussed that he would need to wait to travel until he is 6 weeks out from surgery.   He also asked about what symptoms the surgery will help with. I discussed that the main goal of surgery for his condition (myelopathy) is to help prevent his condition from worsening, but it is possible that some of his symptoms may improve. I offered an in person versus telephone visit with Dr Myer Haff tomorrow, but he declined, as he stated that all of his questions were answered.

## 2023-01-09 ENCOUNTER — Ambulatory Visit (INDEPENDENT_AMBULATORY_CARE_PROVIDER_SITE_OTHER): Payer: Medicare HMO | Admitting: Neurosurgery

## 2023-01-09 ENCOUNTER — Telehealth: Payer: Medicare HMO | Admitting: Neurosurgery

## 2023-01-09 DIAGNOSIS — M4802 Spinal stenosis, cervical region: Secondary | ICD-10-CM

## 2023-01-09 DIAGNOSIS — G959 Disease of spinal cord, unspecified: Secondary | ICD-10-CM

## 2023-01-09 NOTE — Progress Notes (Addendum)
I spoke with Travis Bryant regarding some questions.  His questions were addressed.  He is having some low back pain and leg pain.  His surgery on Monday is unlikely to address those.  However, we will address those in due course.

## 2023-01-13 ENCOUNTER — Observation Stay
Admission: RE | Admit: 2023-01-13 | Discharge: 2023-01-14 | Disposition: A | Discharge status: Home Health | Payer: Medicare HMO | Source: Ambulatory Visit | Attending: Neurosurgery | Admitting: Neurosurgery

## 2023-01-13 ENCOUNTER — Ambulatory Visit: Payer: Medicare HMO

## 2023-01-13 ENCOUNTER — Other Ambulatory Visit: Payer: Self-pay

## 2023-01-13 ENCOUNTER — Encounter: Payer: Self-pay | Admitting: Neurosurgery

## 2023-01-13 ENCOUNTER — Encounter
Admission: RE | Disposition: A | Discharge status: Home Health | Payer: Self-pay | Source: Ambulatory Visit | Attending: Neurosurgery

## 2023-01-13 ENCOUNTER — Ambulatory Visit: Payer: Medicare HMO | Admitting: Urgent Care

## 2023-01-13 DIAGNOSIS — Z87891 Personal history of nicotine dependence: Secondary | ICD-10-CM | POA: Diagnosis not present

## 2023-01-13 DIAGNOSIS — I10 Essential (primary) hypertension: Secondary | ICD-10-CM | POA: Insufficient documentation

## 2023-01-13 DIAGNOSIS — Z01818 Encounter for other preprocedural examination: Secondary | ICD-10-CM

## 2023-01-13 DIAGNOSIS — Z21 Asymptomatic human immunodeficiency virus [HIV] infection status: Secondary | ICD-10-CM | POA: Insufficient documentation

## 2023-01-13 DIAGNOSIS — M5001 Cervical disc disorder with myelopathy,  high cervical region: Principal | ICD-10-CM | POA: Insufficient documentation

## 2023-01-13 DIAGNOSIS — Z79899 Other long term (current) drug therapy: Secondary | ICD-10-CM | POA: Diagnosis not present

## 2023-01-13 DIAGNOSIS — J45909 Unspecified asthma, uncomplicated: Secondary | ICD-10-CM | POA: Diagnosis not present

## 2023-01-13 DIAGNOSIS — M4802 Spinal stenosis, cervical region: Secondary | ICD-10-CM

## 2023-01-13 DIAGNOSIS — E785 Hyperlipidemia, unspecified: Secondary | ICD-10-CM

## 2023-01-13 DIAGNOSIS — G959 Disease of spinal cord, unspecified: Secondary | ICD-10-CM

## 2023-01-13 DIAGNOSIS — Z01812 Encounter for preprocedural laboratory examination: Secondary | ICD-10-CM

## 2023-01-13 HISTORY — PX: ANTERIOR CERVICAL DECOMP/DISCECTOMY FUSION: SHX1161

## 2023-01-13 LAB — ABO/RH: ABO/RH(D): O POS

## 2023-01-13 SURGERY — ANTERIOR CERVICAL DECOMPRESSION/DISCECTOMY FUSION 1 LEVEL
Anesthesia: General

## 2023-01-13 MED ORDER — PHENOL 1.4 % MT LIQD: 1.0000 | OROMUCOSAL | Status: DC | PRN

## 2023-01-13 MED ORDER — FAMOTIDINE 20 MG PO TABS: 20.0000 mg | ORAL_TABLET | Freq: Every day | ORAL | Status: DC | PRN

## 2023-01-13 MED ORDER — ACETAMINOPHEN 325 MG PO TABS: 650.0000 mg | ORAL_TABLET | ORAL | Status: DC | PRN

## 2023-01-13 MED ORDER — ENOXAPARIN SODIUM 40 MG/0.4ML IJ SOSY
40.0000 mg | PREFILLED_SYRINGE | INTRAMUSCULAR | Status: DC
  Filled 2023-01-13: qty 0.4

## 2023-01-13 MED ORDER — FENTANYL CITRATE (PF) 100 MCG/2ML IJ SOLN
INTRAMUSCULAR | Status: AC
  Filled 2023-01-13: qty 2

## 2023-01-13 MED ORDER — FENTANYL CITRATE (PF) 100 MCG/2ML IJ SOLN
INTRAMUSCULAR | Status: DC | PRN
  Administered 2023-01-13 (×2): 50 ug via INTRAVENOUS

## 2023-01-13 MED ORDER — OXYCODONE HCL 5 MG PO TABS
5.0000 mg | ORAL_TABLET | Freq: Once | ORAL | Status: AC | PRN
  Administered 2023-01-13: 5 mg via ORAL

## 2023-01-13 MED ORDER — SODIUM CHLORIDE 0.9 % IV SOLN: INTRAVENOUS | Status: DC

## 2023-01-13 MED ORDER — ACETAMINOPHEN 10 MG/ML IV SOLN
INTRAVENOUS | Status: DC | PRN
  Administered 2023-01-13: 1000 mg via INTRAVENOUS

## 2023-01-13 MED ORDER — CEFAZOLIN SODIUM-DEXTROSE 2-4 GM/100ML-% IV SOLN
INTRAVENOUS | Status: AC
  Filled 2023-01-13: qty 100

## 2023-01-13 MED ORDER — CHLORHEXIDINE GLUCONATE 0.12 % MT SOLN
OROMUCOSAL | Status: AC
  Filled 2023-01-13: qty 15

## 2023-01-13 MED ORDER — GABAPENTIN 300 MG PO CAPS
300.0000 mg | ORAL_CAPSULE | Freq: Three times a day (TID) | ORAL | Status: DC
  Administered 2023-01-13 – 2023-01-14 (×2): 300 mg via ORAL
  Filled 2023-01-13 (×2): qty 1

## 2023-01-13 MED ORDER — SENNA 8.6 MG PO TABS
1.0000 | ORAL_TABLET | Freq: Two times a day (BID) | ORAL | Status: DC
  Administered 2023-01-14: 8.6 mg via ORAL
  Filled 2023-01-13 (×2): qty 1

## 2023-01-13 MED ORDER — SODIUM CHLORIDE 0.9% FLUSH
3.0000 mL | Freq: Two times a day (BID) | INTRAVENOUS | Status: DC
  Administered 2023-01-14: 3 mL via INTRAVENOUS

## 2023-01-13 MED ORDER — 0.9 % SODIUM CHLORIDE (POUR BTL) OPTIME
TOPICAL | Status: DC | PRN
  Administered 2023-01-13: 500 mL

## 2023-01-13 MED ORDER — BUPIVACAINE HCL (PF) 0.5 % IJ SOLN
INTRAMUSCULAR | Status: AC
  Filled 2023-01-13: qty 30

## 2023-01-13 MED ORDER — CYCLOBENZAPRINE HCL 10 MG PO TABS
10.0000 mg | ORAL_TABLET | Freq: Three times a day (TID) | ORAL | Status: DC | PRN
  Filled 2023-01-13: qty 1

## 2023-01-13 MED ORDER — ONDANSETRON HCL 4 MG/2ML IJ SOLN: 4.0000 mg | Freq: Once | INTRAMUSCULAR | Status: DC | PRN

## 2023-01-13 MED ORDER — KETOROLAC TROMETHAMINE 15 MG/ML IJ SOLN
15.0000 mg | Freq: Four times a day (QID) | INTRAMUSCULAR | Status: DC
  Administered 2023-01-13 – 2023-01-14 (×3): 15 mg via INTRAVENOUS
  Filled 2023-01-13 (×2): qty 1

## 2023-01-13 MED ORDER — AMLODIPINE BESYLATE 5 MG PO TABS
5.0000 mg | ORAL_TABLET | Freq: Every day | ORAL | Status: DC
  Administered 2023-01-14: 5 mg via ORAL
  Filled 2023-01-13: qty 1

## 2023-01-13 MED ORDER — BUPIVACAINE-EPINEPHRINE (PF) 0.5% -1:200000 IJ SOLN
INTRAMUSCULAR | Status: DC | PRN
  Administered 2023-01-13: 5 mL via PERINEURAL

## 2023-01-13 MED ORDER — LIDOCAINE HCL (CARDIAC) PF 100 MG/5ML IV SOSY
PREFILLED_SYRINGE | INTRAVENOUS | Status: DC | PRN
  Administered 2023-01-13: 100 mg via INTRATRACHEAL

## 2023-01-13 MED ORDER — FENTANYL CITRATE (PF) 100 MCG/2ML IJ SOLN: 25.0000 ug | INTRAMUSCULAR | Status: DC | PRN

## 2023-01-13 MED ORDER — SURGIFLO WITH THROMBIN (HEMOSTATIC MATRIX KIT) OPTIME
TOPICAL | Status: DC | PRN
  Administered 2023-01-13: 1 via TOPICAL

## 2023-01-13 MED ORDER — FENTANYL CITRATE (PF) 100 MCG/2ML IJ SOLN
25.0000 ug | INTRAMUSCULAR | Status: DC | PRN
  Administered 2023-01-13 (×2): 50 ug via INTRAVENOUS

## 2023-01-13 MED ORDER — ACETAMINOPHEN 10 MG/ML IV SOLN: 1000.0000 mg | Freq: Once | INTRAVENOUS | Status: DC | PRN

## 2023-01-13 MED ORDER — CHLORHEXIDINE GLUCONATE 0.12 % MT SOLN
15.0000 mL | Freq: Once | OROMUCOSAL | Status: AC
  Administered 2023-01-13: 15 mL via OROMUCOSAL

## 2023-01-13 MED ORDER — MENTHOL 3 MG MT LOZG: 1.0000 | LOZENGE | OROMUCOSAL | Status: DC | PRN

## 2023-01-13 MED ORDER — REMIFENTANIL HCL 1 MG IV SOLR
INTRAVENOUS | Status: DC | PRN
  Administered 2023-01-13: .15 ug/kg/min via INTRAVENOUS

## 2023-01-13 MED ORDER — BUDESONIDE 0.5 MG/2ML IN SUSP
0.5000 mg | Freq: Two times a day (BID) | RESPIRATORY_TRACT | Status: DC
  Administered 2023-01-14: 0.5 mg via RESPIRATORY_TRACT
  Filled 2023-01-13 (×2): qty 2

## 2023-01-13 MED ORDER — MIDAZOLAM HCL 2 MG/2ML IJ SOLN
INTRAMUSCULAR | Status: AC
  Filled 2023-01-13: qty 2

## 2023-01-13 MED ORDER — OXYCODONE HCL 5 MG/5ML PO SOLN: 5.0000 mg | Freq: Once | ORAL | Status: DC | PRN

## 2023-01-13 MED ORDER — ACETAMINOPHEN 500 MG PO TABS
1000.0000 mg | ORAL_TABLET | Freq: Four times a day (QID) | ORAL | Status: AC
  Administered 2023-01-13 – 2023-01-14 (×4): 1000 mg via ORAL
  Filled 2023-01-13 (×4): qty 2

## 2023-01-13 MED ORDER — ONDANSETRON HCL 4 MG/2ML IJ SOLN: 4.0000 mg | Freq: Four times a day (QID) | INTRAMUSCULAR | Status: DC | PRN

## 2023-01-13 MED ORDER — REMIFENTANIL HCL 1 MG IV SOLR
INTRAVENOUS | Status: AC
  Filled 2023-01-13: qty 1000

## 2023-01-13 MED ORDER — OXYCODONE HCL 5 MG PO TABS: 5.0000 mg | ORAL_TABLET | ORAL | Status: DC | PRN

## 2023-01-13 MED ORDER — LORATADINE 10 MG PO TABS
10.0000 mg | ORAL_TABLET | Freq: Every day | ORAL | Status: DC
  Administered 2023-01-14: 10 mg via ORAL
  Filled 2023-01-13: qty 1

## 2023-01-13 MED ORDER — ATORVASTATIN CALCIUM 10 MG PO TABS
10.0000 mg | ORAL_TABLET | Freq: Every day | ORAL | Status: DC
  Administered 2023-01-14: 10 mg via ORAL
  Filled 2023-01-13: qty 1

## 2023-01-13 MED ORDER — PHENYLEPHRINE HCL-NACL 20-0.9 MG/250ML-% IV SOLN
INTRAVENOUS | Status: DC | PRN
  Administered 2023-01-13: 25 ug/min via INTRAVENOUS

## 2023-01-13 MED ORDER — ONDANSETRON HCL 4 MG/2ML IJ SOLN
INTRAMUSCULAR | Status: DC | PRN
  Administered 2023-01-13 (×2): 4 mg via INTRAVENOUS

## 2023-01-13 MED ORDER — METOPROLOL SUCCINATE ER 50 MG PO TB24
50.0000 mg | ORAL_TABLET | Freq: Every day | ORAL | Status: DC
  Administered 2023-01-14: 50 mg via ORAL
  Filled 2023-01-13: qty 1

## 2023-01-13 MED ORDER — SODIUM CHLORIDE 0.9 % IV SOLN
250.0000 mL | INTRAVENOUS | Status: DC
  Administered 2023-01-13: 250 mL via INTRAVENOUS

## 2023-01-13 MED ORDER — EMTRICITABINE-TENOFOVIR AF 200-25 MG PO TABS
1.0000 | ORAL_TABLET | Freq: Every day | ORAL | Status: DC
  Administered 2023-01-13 – 2023-01-14 (×2): 1 via ORAL
  Filled 2023-01-13 (×2): qty 1

## 2023-01-13 MED ORDER — SUCCINYLCHOLINE CHLORIDE 200 MG/10ML IV SOSY
PREFILLED_SYRINGE | INTRAVENOUS | Status: DC | PRN
  Administered 2023-01-13: 120 mg via INTRAVENOUS

## 2023-01-13 MED ORDER — MAGNESIUM CITRATE PO SOLN: 1.0000 | Freq: Once | ORAL | Status: DC | PRN

## 2023-01-13 MED ORDER — GLYCOPYRROLATE 0.2 MG/ML IJ SOLN
INTRAMUSCULAR | Status: DC | PRN
  Administered 2023-01-13: .2 mg via INTRAVENOUS

## 2023-01-13 MED ORDER — ORAL CARE MOUTH RINSE: 15.0000 mL | Freq: Once | OROMUCOSAL | Status: AC

## 2023-01-13 MED ORDER — ACETAMINOPHEN 10 MG/ML IV SOLN
INTRAVENOUS | Status: AC
  Filled 2023-01-13: qty 100

## 2023-01-13 MED ORDER — LACTATED RINGERS IV SOLN: INTRAVENOUS | Status: DC

## 2023-01-13 MED ORDER — BUDESONIDE 0.5 MG/2ML IN SUSP: 0.5000 mg | Freq: Two times a day (BID) | RESPIRATORY_TRACT | Status: DC

## 2023-01-13 MED ORDER — CEFAZOLIN IN SODIUM CHLORIDE 2-0.9 GM/100ML-% IV SOLN
2.0000 g | INTRAVENOUS | Status: AC
  Administered 2023-01-13: 2 g via INTRAVENOUS
  Filled 2023-01-13: qty 100

## 2023-01-13 MED ORDER — OXYCODONE HCL 5 MG PO TABS: 5.0000 mg | ORAL_TABLET | Freq: Once | ORAL | Status: DC | PRN

## 2023-01-13 MED ORDER — TAMSULOSIN HCL 0.4 MG PO CAPS
0.8000 mg | ORAL_CAPSULE | Freq: Every day | ORAL | Status: DC
  Administered 2023-01-14: 0.8 mg via ORAL
  Filled 2023-01-13: qty 2

## 2023-01-13 MED ORDER — SODIUM CHLORIDE 0.9% FLUSH: 3.0000 mL | INTRAVENOUS | Status: DC | PRN

## 2023-01-13 MED ORDER — PROPOFOL 10 MG/ML IV BOLUS
INTRAVENOUS | Status: DC | PRN
  Administered 2023-01-13: 30 mg via INTRAVENOUS
  Administered 2023-01-13: 170 mg via INTRAVENOUS

## 2023-01-13 MED ORDER — ACETAMINOPHEN 650 MG RE SUPP: 650.0000 mg | RECTAL | Status: DC | PRN

## 2023-01-13 MED ORDER — KETOROLAC TROMETHAMINE 15 MG/ML IJ SOLN
INTRAMUSCULAR | Status: AC
  Filled 2023-01-13: qty 1

## 2023-01-13 MED ORDER — PANTOPRAZOLE SODIUM 40 MG PO TBEC
80.0000 mg | DELAYED_RELEASE_TABLET | Freq: Every day | ORAL | Status: DC
  Administered 2023-01-14: 80 mg via ORAL
  Filled 2023-01-13: qty 2

## 2023-01-13 MED ORDER — PROPOFOL 500 MG/50ML IV EMUL
INTRAVENOUS | Status: DC | PRN
  Administered 2023-01-13: 165 ug/kg/min via INTRAVENOUS

## 2023-01-13 MED ORDER — DOCUSATE SODIUM 100 MG PO CAPS
100.0000 mg | ORAL_CAPSULE | Freq: Two times a day (BID) | ORAL | Status: DC
  Administered 2023-01-14: 100 mg via ORAL
  Filled 2023-01-13 (×2): qty 1

## 2023-01-13 MED ORDER — MORPHINE SULFATE (PF) 2 MG/ML IV SOLN: 1.0000 mg | INTRAVENOUS | Status: DC | PRN

## 2023-01-13 MED ORDER — LEVOCETIRIZINE DIHYDROCHLORIDE 5 MG PO TABS: 5.0000 mg | ORAL_TABLET | Freq: Every evening | ORAL | Status: DC

## 2023-01-13 MED ORDER — ROPINIROLE HCL 1 MG PO TABS
0.5000 mg | ORAL_TABLET | Freq: Every day | ORAL | Status: DC
  Administered 2023-01-13: 0.5 mg via ORAL
  Filled 2023-01-13: qty 1

## 2023-01-13 MED ORDER — OXYCODONE HCL 5 MG PO TABS: 10.0000 mg | ORAL_TABLET | ORAL | Status: DC | PRN

## 2023-01-13 MED ORDER — BISACODYL 10 MG RE SUPP: 10.0000 mg | Freq: Every day | RECTAL | Status: DC | PRN

## 2023-01-13 MED ORDER — ONDANSETRON HCL 4 MG PO TABS: 4.0000 mg | ORAL_TABLET | Freq: Four times a day (QID) | ORAL | Status: DC | PRN

## 2023-01-13 MED ORDER — OXYCODONE HCL 5 MG PO TABS
ORAL_TABLET | ORAL | Status: AC
  Filled 2023-01-13: qty 1

## 2023-01-13 MED ORDER — DEXAMETHASONE SODIUM PHOSPHATE 10 MG/ML IJ SOLN
INTRAMUSCULAR | Status: DC | PRN
  Administered 2023-01-13: 10 mg via INTRAVENOUS

## 2023-01-13 MED ORDER — POLYETHYLENE GLYCOL 3350 17 G PO PACK: 17.0000 g | PACK | Freq: Every day | ORAL | Status: DC | PRN

## 2023-01-13 MED ORDER — EPINEPHRINE PF 1 MG/ML IJ SOLN
INTRAMUSCULAR | Status: AC
  Filled 2023-01-13: qty 1

## 2023-01-13 MED ORDER — LEVALBUTEROL HCL 0.63 MG/3ML IN NEBU: 0.6300 mg | INHALATION_SOLUTION | RESPIRATORY_TRACT | Status: DC | PRN

## 2023-01-13 MED ORDER — FLUTICASONE PROPIONATE 50 MCG/ACT NA SUSP: 1.0000 | Freq: Every day | NASAL | Status: DC | PRN

## 2023-01-13 MED ORDER — IPRATROPIUM BROMIDE 0.03 % NA SOLN: 2.0000 | Freq: Two times a day (BID) | NASAL | Status: DC | PRN

## 2023-01-13 SURGICAL SUPPLY — 45 items
ADH SKN CLS APL DERMABOND .7 (GAUZE/BANDAGES/DRESSINGS) ×1
AGENT HMST KT MTR STRL THRMB (HEMOSTASIS) ×1
ALLOGRAFT BONE FIBER KORE 1CC (Bone Implant) IMPLANT
BASIN KIT SINGLE STR (MISCELLANEOUS) ×1 IMPLANT
BUR NEURO DRILL SOFT 3.0X3.8M (BURR) ×1 IMPLANT
DERMABOND ADVANCED .7 DNX12 (GAUZE/BANDAGES/DRESSINGS) ×1 IMPLANT
DRAPE C ARM PK CFD 31 SPINE (DRAPES) ×1 IMPLANT
DRAPE LAPAROTOMY 77X122 PED (DRAPES) ×1 IMPLANT
DRAPE MICROSCOPE SPINE 48X150 (DRAPES) ×1 IMPLANT
DRSG OPSITE POSTOP 4X6 (GAUZE/BANDAGES/DRESSINGS) ×2 IMPLANT
ELECT REM PT RETURN 9FT ADLT (ELECTROSURGICAL) ×1
ELECTRODE REM PT RTRN 9FT ADLT (ELECTROSURGICAL) ×1 IMPLANT
FEE INTRAOP CADWELL SUPPLY NCS (MISCELLANEOUS) IMPLANT
FEE INTRAOP MONITOR IMPULS NCS (MISCELLANEOUS) IMPLANT
GLOVE BIOGEL PI IND STRL 6.5 (GLOVE) ×1 IMPLANT
GLOVE SURG SYN 6.5 ES PF (GLOVE) ×2 IMPLANT
GLOVE SURG SYN 6.5 PF PI (GLOVE) ×1 IMPLANT
GLOVE SURG SYN 8.5 E (GLOVE) ×3 IMPLANT
GLOVE SURG SYN 8.5 PF PI (GLOVE) ×3 IMPLANT
GOWN SRG LRG LVL 4 IMPRV REINF (GOWNS) ×1 IMPLANT
GOWN SRG XL LVL 3 NONREINFORCE (GOWNS) ×1 IMPLANT
GOWN STRL NON-REIN TWL XL LVL3 (GOWNS) ×1
GOWN STRL REIN LRG LVL4 (GOWNS) ×1
INTRAOP CADWELL SUPPLY FEE NCS (MISCELLANEOUS) ×1
INTRAOP DISP SUPPLY FEE NCS (MISCELLANEOUS) ×1
INTRAOP MONITOR FEE IMPULS NCS (MISCELLANEOUS) ×1
KIT TURNOVER KIT A (KITS) ×1 IMPLANT
MANIFOLD NEPTUNE II (INSTRUMENTS) ×1 IMPLANT
NDL SAFETY ECLIP 18X1.5 (MISCELLANEOUS) ×1 IMPLANT
NS IRRIG 1000ML POUR BTL (IV SOLUTION) ×1 IMPLANT
PACK LAMINECTOMY ARMC (PACKS) ×1 IMPLANT
PAD ARMBOARD 7.5X6 YLW CONV (MISCELLANEOUS) ×2 IMPLANT
PIN CASPAR 14 (PIN) ×1 IMPLANT
PIN CASPAR 14MM (PIN) ×1
PLATE ACP 1.6 18 (Plate) IMPLANT
SCREW ACP 3.5X17 S/D VARIA (Screw) IMPLANT
SPACER HEDRON C 12X14X8 7D (Spacer) IMPLANT
STAPLER SKIN PROX 35W (STAPLE) IMPLANT
SURGIFLO W/THROMBIN 8M KIT (HEMOSTASIS) ×1 IMPLANT
SUT DVC VLOC 90 3-0 CV23 UNDY (SUTURE) IMPLANT
SUT VIC AB 3-0 SH 8-18 (SUTURE) ×1 IMPLANT
SYR 20ML LL LF (SYRINGE) ×1 IMPLANT
TAPE CLOTH 3X10 WHT NS LF (GAUZE/BANDAGES/DRESSINGS) ×3 IMPLANT
TRAP FLUID SMOKE EVACUATOR (MISCELLANEOUS) ×1 IMPLANT
WATER STERILE IRR 1000ML POUR (IV SOLUTION) ×2 IMPLANT

## 2023-01-13 NOTE — Anesthesia Preprocedure Evaluation (Addendum)
Anesthesia Evaluation  Patient identified by MRN, date of birth, ID band Patient awake    Reviewed: Allergy & Precautions, NPO status , Patient's Chart, lab work & pertinent test results  History of Anesthesia Complications Negative for: history of anesthetic complications  Airway Mallampati: II  TM Distance: >3 FB Neck ROM: Full    Dental no notable dental hx. (+) Teeth Intact   Pulmonary asthma , neg sleep apnea, neg COPD, Patient abstained from smoking.Not current smoker, former smoker Takes daily inhaler, rescue inhaler use once every few months   Pulmonary exam normal breath sounds clear to auscultation       Cardiovascular Exercise Tolerance: Good METShypertension, + CAD  (-) Past MI (-) dysrhythmias  Rhythm:Regular Rate:Normal - Systolic murmurs Cardiology note from January 2024: "Travis Bryant  is a  62 y.o.  African-American male with hypertension, bronchial asthma, remote tobacco use, chronic dyspnea on exertion and chronic stable angina, family history of premature CAD with MI in father at age 33 Y. 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  RCA runs posteriorly between aortic root and left atrium in normal fashion and LAD runs anteriorly between aortic root and pulmonary artery. Due to malignant course of the LAD, exertional chest pain and dyspnea, he was recommended CABG of the LAD with reimplantation, however he preferred to do conservative management and continues to exercise with mild dyspnea but no angina or dizziness or syncope.   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  This was diagnosed in 2020, but has remained stable without any syncope or angina is part of routine exercise which she continues to walk on a  regular basis.  Also he has had an exercise stress test in 2020 in which there was no inducible EKG changes or arrhythmias.  Hence we will continue conservative management.  Continue moderate dose beta-blocker therapy with metoprolol succinate 50 mg daily.   2. Primary hypertension With combination of amlodipine and beta-blocker therapy with metoprolol succinate, blood pressure is under excellent control.  I reviewed his labs, patient has had labs done by his PCP however I am not been able to get his labs, patient will upload them into MyChart.  He has normal lipids, normal renal function and CBC at least 1.5 years ago.   Patient's main issue presently is severe neck pain due to disc prolapse and also lumbar degenerative disease and he has been having some tingling and numbness and weakness in his right arm and has also been walking with a limp.  Advised him that he should proceed with surgical intervention as recommended by his neurosurgeon and that he would not do well from cardiac standpoint."  Due to the language in the rest of the note, I believe it is a typo/mistake that said the patient would NOT do well from cardiac standpoint. Given that yearly followup and no medication changes were recommended, I believe cardiologist meant to write that he WOULD do well. Additionally, patient states himself that at the visit, the cardiologist said he should be "good to go" for the surgery. I reached out to cardiologist via Epic chat but he has not confirmed as of this writing    Neuro/Psych  Headaches  negative psych ROS   GI/Hepatic ,GERD  Medicated and Controlled,,(+)     (-) substance abuse  Endo/Other  neg diabetes    Renal/GU negative Renal ROS     Musculoskeletal   Abdominal   Peds  Hematology   Anesthesia Other Findings Past Medical History: No date: Angina at rest No date: Asthma No date: Benign enlargement of prostate No date: Bladder disease No date: BPH with urinary  obstruction No date: Cervical myelopathy (HCC) No date: Environmental allergies No date: Erectile dysfunction No date: GERD (gastroesophageal reflux disease) No date: Hematuria 1966: History of closed head injury     Comment:  Involving the left brain No date: History of headache No date: HIV exposure No date: HTN (hypertension) 01/25/2013: Memory change No date: Mild hyperlipidemia 01/14/2014: Restless legs syndrome (RLS) No date: Seasonal and perennial allergic rhinitis No date: Tuberculosis  Reproductive/Obstetrics                             Anesthesia Physical Anesthesia Plan  ASA: 2  Anesthesia Plan: General   Post-op Pain Management: Ofirmev IV (intra-op)*   Induction: Intravenous  PONV Risk Score and Plan: 3 and Ondansetron, Midazolam, Propofol infusion and TIVA  Airway Management Planned: Oral ETT and Video Laryngoscope Planned  Additional Equipment: None  Intra-op Plan:   Post-operative Plan: Extubation in OR  Informed Consent: I have reviewed the patients History and Physical, chart, labs and discussed the procedure including the risks, benefits and alternatives for the proposed anesthesia with the patient or authorized representative who has indicated his/her understanding and acceptance.     Dental advisory given  Plan Discussed with: CRNA and Surgeon  Anesthesia Plan Comments: (Discussed risks of anesthesia with patient, including PONV, sore throat, lip/dental/eye damage. Rare risks discussed as well, such as cardiorespiratory and neurological sequelae, and allergic reactions. Discussed the role of CRNA in patient's perioperative care. Patient understands.)       Anesthesia Quick Evaluation

## 2023-01-13 NOTE — Discharge Instructions (Signed)
Your surgeon has performed an operation on your cervical spine (neck) to relieve pressure on the spinal cord and/or nerves. This involved making an incision in the front of your neck and removing one or more of the discs that support your spine. Next, a small piece of bone, a titanium plate, and screws were used to fuse two or more of the vertebrae (bones) together.  The following are instructions to help in your recovery once you have been discharged from the hospital. Even if you feel well, it is important that you follow these activity guidelines. If you do not let your neck heal properly from the surgery, you can increase the chance of return of your symptoms and other complications.  * Do not take anti-inflammatory medications for 3 months after surgery (naproxen [Aleve], ibuprofen [Advil, Motrin], etc.). These medications can prevent your bones from healing properly.  Celebrex, if prescribed, is ok to take.  Activity    No bending, lifting, or twisting ("BLT"). Avoid lifting objects heavier than 10 pounds (gallon milk jug).  Where possible, avoid household activities that involve lifting, bending, reaching, pushing, or pulling such as laundry, vacuuming, grocery shopping, and childcare. Try to arrange for help from friends and family for these activities while your back heals.  Increase physical activity slowly as tolerated.  Taking short walks is encouraged, but avoid strenuous exercise. Do not jog, run, bicycle, lift weights, or participate in any other exercises unless specifically allowed by your doctor.  Talk to your doctor before resuming sexual activity.  You should not drive until cleared by your doctor.  Until released by your doctor, you should not return to work or school.  You should rest at home and let your body heal.   You may shower three days after your surgery.  After showering, lightly dab your incision dry. Do not take a tub bath or go swimming until approved by your  doctor at your follow-up appointment.  If your doctor ordered a cervical collar (neck brace) for you, you should wear it whenever you are out of bed. You may remove it when lying down or sleeping, but you should wear it at all other times. Not all neck surgeries require a cervical collar.  If you smoke, we strongly recommend that you quit.  Smoking has been proven to interfere with normal bone healing and will dramatically reduce the success rate of your surgery. Please contact [facility] (800-QUIT-NOW) and use the resources at www.[facility].com for assistance in stopping smoking.  Surgical Incision   If you have a dressing on your incision, you may remove it two days after your surgery. Keep your incision area clean and dry.  If you have staples or stitches on your incision, you should have a follow up scheduled for removal. If you do not have staples or stitches, you will have steri-strips (small pieces of surgical tape) or Dermabond glue. The steri-strips/glue should begin to peel away within about a week (it is fine if the steri-strips fall off before then). If the strips are still in place one week after your surgery, you may gently remove them.  Diet           You may return to your usual diet. However, you may experience discomfort when swallowing in the first month after your surgery. This is normal. You may find that softer foods are more comfortable for you to swallow. Be sure to stay hydrated.  When to Contact Us  You may experience pain in your   neck and/or pain between your shoulder blades. This is normal and should improve in the next few weeks with the help of pain medication, muscle relaxers, and rest. Some patients report that a warm compress on the back of the neck or between the shoulder blades helps.  However, should you experience any of the following, contact us immediately: New numbness or weakness Pain that is progressively getting worse, and is not relieved by your pain  medication, muscle relaxers, rest, and warm compresses Bleeding, redness, swelling, pain, or drainage from surgical incision Chills or flu-like symptoms Fever greater than 101.0 F (38.3 C) Inability to eat, drink fluids, or take medications Problems with bowel or bladder functions Difficulty breathing or shortness of breath Warmth, tenderness, or swelling in your calf Contact Information During office hours (Monday-Friday 9 am to 5 pm), please call your physician at [phone] and ask for Kendelyn Jean After hours and weekends, please call [phone] and speak with the neurosurgeon on call For a life-threatening emergency, call 911  

## 2023-01-13 NOTE — Anesthesia Procedure Notes (Addendum)
Procedure Name: Intubation Date/Time: 01/13/2023 10:04 AM  Performed by: Mohammed Kindle, CRNAPre-anesthesia Checklist: Patient identified, Emergency Drugs available, Suction available and Patient being monitored Patient Re-evaluated:Patient Re-evaluated prior to induction Oxygen Delivery Method: Circle system utilized Preoxygenation: Pre-oxygenation with 100% oxygen Induction Type: IV induction Ventilation: Mask ventilation without difficulty Laryngoscope Size: McGraph Grade View: Grade I Tube type: Oral Tube size: 7.0 mm Number of attempts: 1 Airway Equipment and Method: Stylet and Oral airway Placement Confirmation: ETT inserted through vocal cords under direct vision, positive ETCO2, breath sounds checked- equal and bilateral and CO2 detector Secured at: 21 cm Tube secured with: Tape Dental Injury: Teeth and Oropharynx as per pre-operative assessment
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
## 2023-01-13 NOTE — Op Note (Signed)
Indications: [name] is a 62 y.o. male with G95.9 Cervical myelopathy, M48.02 Cervical stenosis of spinal canal   Findings: stenosis, successful decompression  Preoperative Diagnosis: G95.9 Cervical myelopathy, M48.02 Cervical stenosis of spinal canal  Postoperative Diagnosis: same   EBL: 25 ml IVF: 500 ml Drains: none Disposition: Extubated and Stable to PACU Complications: none  No foley catheter was placed.   Preoperative Note:    Risks of surgery discussed include: infection, bleeding, stroke, coma, death, paralysis, CSF leak, nerve/spinal cord injury, numbness, tingling, weakness, complex regional pain syndrome, recurrent stenosis and/or disc herniation, vascular injury, development of instability, neck/back pain, need for further surgery, persistent symptoms, development of deformity, and the risks of anesthesia. The patient understood these risks and agreed to proceed.  Operative Note:   Procedure:  1) Anterior cervical diskectomy and fusion at C3-4 2) Anterior cervical instrumentation at C3-4 3) Insertion of biomechanical device at C3-4   Procedure: After obtaining informed consent, the patient taken to the operating room, placed in supine position, general anesthesia induced.  The patient had a small shoulder roll placed behind their shoulders.  The patient received preop antibiotics and IV Decadron.  The patient had a neck incision outlined, was prepped and draped in usual sterile fashion. The incision was injected with local anesthetic.   An incision was opened, dissection taken down medial to the carotid artery and jugular vein, lateral to the trachea and esophagus.  The prevertebral fascia was identified, and a localizing x-ray demonstrated the correct level.  The longus colli were dissected laterally, and self-retaining retractors placed to open the operative field. The microscope was then brought into the field.  With this complete, distractor pins were placed in  the vertebral bodies of C3 and C4. The distractor was placed, and the annulus at C3/4 was opened using a bovie.  Curettes and pituitary rongeurs used to remove the majority of disk, then the drill was used to remove the posterior osteophyte, expose the posterior longitudinal ligament, and begin the foraminotomies. The nerve hook was used to elevate the posterior longitudinal ligament, which was then removed with Kerrison rongeurs to complete decompression of the spinal cord. The Kerrison rongeurs were then used to complete the foraminotomies bilaterally to decompress the nerve roots. The nerve hook could be passed out each foramen, ensuring decompression of the nerve roots. Meticulous hemostasis was obtained. A biomechanical device (Globus Hedron C 8 mm height x 14 mm width by 12 mm depth) was placed at C3/4. The device had been filled with demineralized bone matrix for aid in arthrodesis.  Please note that the procedure included removal of the disc, removal of the posterior osteophytes, and removal of the posterior longitudinal ligament to ensure decompression of the spinal cord.  Additionally, foraminotomies were performed on both sides of the spinal canal to decompress the nerve roots.  The caspar distractor was removed, and bone wax used for hemostasis. A separate, 18 mm Nuvasive ACP plate was chosen.  Two screws placed in each vertebral body, respectively making sure the screws were behind the locking mechanism.  Final AP and lateral radiographs were taken.   Please note that the plate is not inclusive to the biomechanical device.  The anchoring mechanism of the plate is completely separate from the biomechanical device.  With everything in good position, the wound was irrigated copiously and meticulous hemostasis obtained.  Wound was closed in 2 layers using interrupted inverted 3-0 Vicryl sutures.  The wound was dressed with dermabond, the head of  bed at 30 degrees, taken to recovery room in stable  condition.  No new postop neurological deficits were identified.  Sponge and pattie counts were correct at the end of the procedure.   I performed the entire procedure with the assistance of Travis Charity PA as an Designer, television/film set. An assistant was required for this procedure due to the complexity.  The assistant provided assistance in tissue manipulation and suction, and was required for the successful and safe performance of the procedure. I performed the critical portions of the procedure.   Venetia Night MD

## 2023-01-13 NOTE — H&P (Signed)
Please see H and P in the progress note from today.

## 2023-01-13 NOTE — Transfer of Care (Signed)
Immediate Anesthesia Transfer of Care Note  Patient: Travis Bryant  Procedure(s) Performed: C3-4 ANTERIOR CERVICAL DISCECTOMY AND FUSION (HEDRON)  Patient Location: PACU  Anesthesia Type:General  Level of Consciousness: drowsy  Airway & Oxygen Therapy: Patient Spontanous Breathing and Patient connected to face mask oxygen  Post-op Assessment: Report given to RN and Post -op Vital signs reviewed and stable  Post vital signs: Reviewed and stable  Last Vitals:  Vitals Value Taken Time  BP 118/76 01/13/23 1157  Temp 35.8 C 01/13/23 1157  Pulse 66 01/13/23 1159  Resp 13 01/13/23 1159  SpO2 100 % 01/13/23 1159  Vitals shown include unvalidated device data.  Last Pain:  Vitals:   01/13/23 0856  TempSrc: Oral         Complications: No notable events documented.

## 2023-01-14 ENCOUNTER — Encounter: Payer: Medicare HMO | Admitting: Orthopedic Surgery

## 2023-01-14 ENCOUNTER — Encounter: Payer: Self-pay | Admitting: Neurosurgery

## 2023-01-14 DIAGNOSIS — M5001 Cervical disc disorder with myelopathy,  high cervical region: Secondary | ICD-10-CM | POA: Diagnosis not present

## 2023-01-14 MED ORDER — CYCLOBENZAPRINE HCL 10 MG PO TABS: 10.0000 mg | ORAL_TABLET | Freq: Three times a day (TID) | ORAL | 0 refills | Status: AC | PRN

## 2023-01-14 MED ORDER — SENNA 8.6 MG PO TABS: 1.0000 | ORAL_TABLET | Freq: Two times a day (BID) | ORAL | 0 refills | Status: AC

## 2023-01-14 MED ORDER — OXYCODONE HCL 5 MG PO TABS: 5.0000 mg | ORAL_TABLET | ORAL | 0 refills | Status: AC | PRN

## 2023-01-14 NOTE — Anesthesia Postprocedure Evaluation (Signed)
Anesthesia Post Note  Patient: Travis Bryant  Procedure(s) Performed: C3-4 ANTERIOR CERVICAL DISCECTOMY AND FUSION (HEDRON)  Patient location during evaluation: PACU Anesthesia Type: General Level of consciousness: awake and alert Pain management: pain level controlled Vital Signs Assessment: post-procedure vital signs reviewed and stable Respiratory status: spontaneous breathing, nonlabored ventilation, respiratory function stable and patient connected to nasal cannula oxygen Cardiovascular status: blood pressure returned to baseline and stable Postop Assessment: no apparent nausea or vomiting Anesthetic complications: no   No notable events documented.   Last Vitals:  Vitals:   01/13/23 2100 01/14/23 0725  BP: 130/74   Pulse: 70   Resp: 20   Temp: 36.7 C   SpO2: 98% 97%    Last Pain:  Vitals:   01/13/23 2215  TempSrc:   PainSc: 0-No pain                 Corinda Gubler

## 2023-01-14 NOTE — Progress Notes (Signed)
   Neurosurgery Progress Note  History: Travis Bryant is s/p C3-4 ACDF for myelopathy   POD1: reports improvement in his arm pain this morning  Physical Exam: Vitals:   01/13/23 2100 01/14/23 0725  BP: 130/74   Pulse: 70   Resp: 20   Temp: 98 F (36.7 C)   SpO2: 98% 97%    AA Ox3 CNI  Strength:5/5 throughout BUE except 4 in bilateral IO and HG  Data:  Other tests/results: none  Assessment/Plan:  Travis Bryant is a 62 y.o presenting with cervical myelopathy s/p C3-4 ACDF with improvement of arm symptoms on POD1  - mobilize - pain control - DVT prophylaxis - PTOT; plans for [facility] with [facility] at d/c.   Man[place] Charity PA-C Department of Neurosurgery
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
## 2023-01-14 NOTE — Discharge Summary (Signed)
Discharge Summary  Patient ID: Travis Bryant MRN: [id number] DOB/AGE: [birth year] 62 y.o.  Admit date: 01/13/2023 Discharge date: 01/14/2023  Admission Diagnoses: G95.9 Cervical myelopathy, M48.02 Cervical stenosis of spinal canal   Discharge Diagnoses:  Principal Problem:   Cervical myelopathy (HCC) Active Problems:   Myelopathy (HCC)   Cervical spinal stenosis   Discharged Condition: good  Hospital Course:  Windom Balser is a 62 year old presenting with cervical myelopathy status post C3-4 ACDF.  His intraoperative course was uncomplicated and he was admitted overnight for monitoring and pain control.  His pain was well-controlled and he was swallowing well on postop day 1.  He was seen by therapy and deemed appropriate for discharge home with home health with [facility].  He was discharged on postop day 1 with prescriptions for pain medication, muscle relaxer, and stool softener.  Consults:  none  Significant Diagnostic Studies: none   Treatments: surgery: As above.  Please see separately dictated operative report for further details  Discharge Exam: Blood pressure (!) 141/86, pulse 73, temperature 98.5 F (36.9 C), resp. rate 18, height 5\' 10"  (1.778 m), weight 75.8 kg, SpO2 100 %. AA Ox3 CNI   Strength:5/5 throughout BUE except 4 in bilateral IO and HG  Disposition: Discharge disposition: 01-Home or Self Care       Discharge Instructions     Incentive spirometry RT   Complete by: As directed       Allergies as of 01/14/2023   No Known Allergies      Medication List     STOP taking these medications    ibuprofen 800 MG tablet Commonly known as: ADVIL   naproxen sodium 550 MG tablet Commonly known as: Anaprox DS       TAKE these medications    acetaminophen 325 MG tablet Commonly known as: TYLENOL Take 650 mg by mouth every 6 (six) hours as needed (for pain).   amLODipine 5 MG tablet Commonly known as: NORVASC TAKE 1 TABLET(5 MG) BY MOUTH DAILY    Arnuity Ellipta 200 MCG/ACT Aepb Generic drug: Fluticasone Furoate Inhale 1 puff into the lungs daily.   atorvastatin 10 MG tablet Commonly known as: LIPITOR TAKE 1 TABLET(10 MG) BY MOUTH DAILY   azelastine 0.1 % nasal spray Commonly known as: ASTELIN Place 2 sprays into both nostrils 2 (two) times daily. What changed:  when to take this reasons to take this   cyclobenzaprine 10 MG tablet Commonly known as: FLEXERIL Take 1 tablet (10 mg total) by mouth 3 (three) times daily as needed for muscle spasms. What changed:  medication strength how much to take   Descovy 200-25 MG tablet Generic drug: emtricitabine-tenofovir AF Take 1 tablet by mouth daily.   famotidine 20 MG tablet Commonly known as: PEPCID Take 1 tablet (20 mg total) by mouth daily. What changed:  when to take this reasons to take this   fluticasone 220 MCG/ACT inhaler Commonly known as: FLOVENT HFA Inhale 2 puffs into the lungs 2 (two) times daily.   fluticasone 50 MCG/ACT nasal spray Commonly known as: FLONASE Place 1-2 sprays into both nostrils daily as needed.   gabapentin 300 MG capsule Commonly known as: NEURONTIN Take 300 mg by mouth 3 (three) times daily.   ipratropium 0.03 % nasal spray Commonly known as: ATROVENT Place 2 sprays into both nostrils 2 (two) times daily. What changed:  when to take this reasons to take this   levalbuterol 45 MCG/ACT inhaler Commonly known as: Xopenex HFA Inhale 2 puffs into  the lungs every 4 (four) hours as needed.   levocetirizine 5 MG tablet Commonly known as: XYZAL TAKE 1 TABLET(5 MG) BY MOUTH EVERY EVENING   metoprolol succinate 50 MG 24 hr tablet Commonly known as: TOPROL-XL TAKE 1 TABLET(50 MG) BY MOUTH DAILY WITH OR IMMEDIATELY FOLLOWING A MEAL   omeprazole 40 MG capsule Commonly known as: PRILOSEC Take 1 capsule (40 mg total) by mouth daily. What changed:  when to take this reasons to take this   oxyCODONE 5 MG immediate release  tablet Commonly known as: Oxy IR/ROXICODONE Take 1 tablet (5 mg total) by mouth every 4 (four) hours as needed for up to 5 days for moderate pain or severe pain ((score 4 to 6)).   rOPINIRole 0.5 MG tablet Commonly known as: REQUIP Take 0.5 mg by mouth at bedtime.   senna 8.6 MG Tabs tablet Commonly known as: SENOKOT Take 1 tablet (8.6 mg total) by mouth 2 (two) times daily.   tadalafil 5 MG tablet Commonly known as: CIALIS Take 5 mg by mouth daily as needed.   tamsulosin 0.4 MG Caps capsule Commonly known as: FLOMAX Take 0.8 mg by mouth daily.        Follow-up Information     Drake Leach, PA-C Follow up on 01/28/2023.   Specialty: Neurosurgery Contact information: [address] [place] [address] [phone]                 Signed: Susanne Borders 01/14/2023, 11:07 AM

## 2023-01-14 NOTE — Progress Notes (Signed)
OT Cancellation Note  Patient Details Name: Travis Bryant MRN: [id number] DOB: May 16, 1961   Cancelled Treatment:    Reason Eval/Treat Not Completed: Other (comment). Pt with RN reviewing discharge instructions upon attempt. Left handout. Spoke with PT who reported pt did very well and is near baseline. Will re-attempt OT evaluation at later time if pt is available.   Arman Filter., MPH, MS, OTR/L ascom [phone] 01/14/23, 10:29 AM

## 2023-01-14 NOTE — TOC Transition Note (Signed)
Transition of Care [facility]) - CM/SW Discharge Note   Patient Details  Name: Travis Bryant MRN: [id number] Date of Birth: November 24, 1960  [facility] of [facility] CM/SW Contact:  Garret Reddish, RN Phone Number: 01/14/2023, 11:25 AM   Clinical Narrative:  Chart reviewed.  Noted that patient has orders for discharge today.  Patient has been pre-arranged with [facility].  Patient is agreeable to using [facility] for Home Health services.  I have informed Amy with [name] that patient will be a discharge home for today.    PT reports that patient has no DME needs.    I have informed staff nurse of the above information.      Final next level of care: Home w Home Health Services Barriers to Discharge: No Barriers Identified   Patient Goals and CMS Choice CMS Medicare.gov Compare Post Acute Care list provided to:: Patient Choice offered to / list presented to : Patient  Discharge Placement                      Patient and family notified of of transfer: 01/14/23  Discharge Plan and Services Additional resources added to the After Visit Summary for                  DME Arranged:  (No DME recommendations)         HH Arranged: PT, OT HH Agency: [facility] Date [facility] Agency Contacted: 01/14/23 Time HH Agency Contacted: 1100 Representative spoke with at [facility] Agency: Amy  Social Determinants of Health (SDOH) Interventions SDOH Screenings   Tobacco Use: Medium Risk (01/14/2023)     Readmission Risk Interventions     No data to display

## 2023-01-14 NOTE — Plan of Care (Signed)

## 2023-01-17 ENCOUNTER — Telehealth: Payer: Self-pay

## 2023-01-17 NOTE — Telephone Encounter (Signed)
  Media Information   Document Information  AMB HH/NH/Hospice  TELEPHONE ADVICE RECORD  01/17/2023 16:42  Attached To:  Travis Bryant  Source Information  Default, Provider, MD

## 2023-01-17 NOTE — Telephone Encounter (Signed)
Called [facility] and informed them to use the protocol for cervical fusions.

## 2023-01-20 ENCOUNTER — Telehealth: Payer: Self-pay | Admitting: Neurosurgery

## 2023-01-20 NOTE — Telephone Encounter (Signed)
[facility] notified

## 2023-01-20 NOTE — Telephone Encounter (Signed)
C3-4 ACDF on 01/13/23  Travis Bryant from [place] Patient was evaluated for PT on 01/16/23, it's different from the protocol. She did not get the protocol until 01/17/23. The frequency is different from the protocol per patient request. 1 x 1 2 x 2

## 2023-01-21 ENCOUNTER — Telehealth: Payer: Self-pay | Admitting: Neurosurgery

## 2023-01-21 DIAGNOSIS — Z981 Arthrodesis status: Secondary | ICD-10-CM

## 2023-01-21 DIAGNOSIS — G959 Disease of spinal cord, unspecified: Secondary | ICD-10-CM

## 2023-01-21 MED ORDER — OXYCODONE HCL 5 MG PO TABS
5.0000 mg | ORAL_TABLET | ORAL | 0 refills | Status: DC | PRN
Start: 2023-01-21 — End: 2023-01-30

## 2023-01-21 NOTE — Telephone Encounter (Signed)
Pt called in today asking for refill of oxycodone 5mg  has one left.  [facility] on [address]  Swelling around the incision according to his sister/ caretaker and didn't know if he could come in for his first post op sooner rather than later and also possibly order a soft collar.   CB: [phone]

## 2023-01-21 NOTE — Telephone Encounter (Signed)
ACDF C3-C4 for cervical myelopathy on 01/13/23.   PMP reviewed and is appropriate.   Oxycodone refill okay and sent to pharmacy. Please let him know.

## 2023-01-21 NOTE — Telephone Encounter (Signed)
Patient aware of the medication refill and he will send the picture.

## 2023-01-22 ENCOUNTER — Telehealth: Payer: Self-pay

## 2023-01-22 NOTE — Telephone Encounter (Addendum)
Dr Myer Haff notified me of this call. I called RA White back and informed her that it is OK to proceed with OT.      Media Information   Document Information  AMB HH/NH/Hospice  TELEPHONE ADVICE RECORD  01/22/2023 17:06  Attached To:  Travis Bryant  Source Information  Default, Provider, MD

## 2023-01-23 ENCOUNTER — Telehealth: Payer: Self-pay | Admitting: Neurosurgery

## 2023-01-23 NOTE — Telephone Encounter (Signed)
Verbal order to start OT next week, during the week of 01/27/23 through 01/31/23.

## 2023-01-23 NOTE — Telephone Encounter (Signed)
Wilkie Aye notified and she said that she did see the message from the RA White with your response after she called [place].

## 2023-01-23 NOTE — Telephone Encounter (Signed)
Please let Wilkie Aye know that I already spoke with someone yesterday at the end of the day about starting OT (her name was RA White). (See telephone call from yesterday). Thanks

## 2023-01-27 NOTE — Progress Notes (Unsigned)
   REFERRING PHYSICIAN:  [name], [address] [place],  [place] [address]  DOS: ACDF C3-C4 for myelopathy on 01/14/23  HISTORY OF PRESENT ILLNESS: Travis Bryant is 2 weeks status post ACDF C3-C4. Given flexeril and oxycodone on discharge from the hospital.   He is having some posterior neck discomfort and numbness in both arms. Having some swallowing issues- needs to chew up chicken well. Doing well with other foods.   He stopped his neurontin- was taking 300mg  tid. Would like to restart it.   He is doing HHPT/OT.    PHYSICAL EXAMINATION:  NEUROLOGICAL:  General: In no acute distress.   Awake, alert, oriented to person, place, and time.  Pupils equal round and reactive to light.  Facial tone is symmetric.    Strength: Side Biceps Triceps Deltoid Interossei Grip Wrist Ext. Wrist Flex.  R 5 5 5 5 5 5 5   L 5 5 5 5 5 5 5    Incision c/d/i  Imaging:  Nothing new to review.   Assessment / Plan: WYNSTON KALAFUT is doing well s/p above surgery. Treatment options reviewed with patient and following plan made:   - We discussed activity escalation and I have advised the patient to lift up to 10 pounds until 6 weeks after surgery (until follow up with Dr. Myer Haff).   - Reviewed wound care.  - Continue current medications including prn oxycodone and flexeril.  - Discussed restarting neurontin. He will start with 1 q hs and work up to tid. Given instructions.  - Continue with HHPT/OT.  - Follow up as scheduled in 4 weeks and prn.   Advised to contact the office if any questions or concerns arise.   Drake Leach PA-C Dept of Neurosurgery

## 2023-01-28 ENCOUNTER — Ambulatory Visit (INDEPENDENT_AMBULATORY_CARE_PROVIDER_SITE_OTHER): Payer: Medicare HMO | Admitting: Orthopedic Surgery

## 2023-01-28 ENCOUNTER — Encounter: Payer: Self-pay | Admitting: Orthopedic Surgery

## 2023-01-28 VITALS — BP 134/69 | HR 94 | Temp 97.7°F

## 2023-01-28 DIAGNOSIS — Z09 Encounter for follow-up examination after completed treatment for conditions other than malignant neoplasm: Secondary | ICD-10-CM

## 2023-01-28 DIAGNOSIS — G959 Disease of spinal cord, unspecified: Secondary | ICD-10-CM

## 2023-01-28 DIAGNOSIS — Z981 Arthrodesis status: Secondary | ICD-10-CM

## 2023-01-28 NOTE — Patient Instructions (Signed)
It was nice to see you today.   I am glad that you are feeling better!   Okay to get incision wet in the shower, do not submerge in pool or hot tub.   Call if any concerns about the incision such as redness, drainage, or fever/chills.   You can lift up to 10 pounds until your follow up with Dr.  Myer Haff in 4 weeks.   Continue on oxycodone as needed for pain. Remember this can make you sleepy and/or constipated.   Continue on cyclobenzaprine as needed for muscle spasms. This can make you sleepy.   Okay to restart gabapentin as follows:  Take 300mg  at night for 3 days then,  Take 300mg  one at lunch and one at night for 3 days, then Take 300mg  one in the morning, one at lunch, and one at night.   Most common side effects of gabapentin are feeling sleepy, dizzy, or drunk. Let us know if you have any concerns.   We will see you back in 4 weeks for your 6 weeks postop visit. You will need to come 1 hour early for xrays.   Please call with any questions or concerns. Message or call if you need any refills prior to your follow up.   Drake Leach PA-C [phone]

## 2023-01-30 ENCOUNTER — Other Ambulatory Visit: Payer: Self-pay | Admitting: Orthopedic Surgery

## 2023-01-30 DIAGNOSIS — G959 Disease of spinal cord, unspecified: Secondary | ICD-10-CM

## 2023-01-30 DIAGNOSIS — Z981 Arthrodesis status: Secondary | ICD-10-CM

## 2023-01-30 MED ORDER — OXYCODONE HCL 5 MG PO TABS
5.00 mg | ORAL_TABLET | ORAL | 0 refills | Status: DC | PRN
Start: 2023-01-30 — End: 2023-02-17

## 2023-01-30 NOTE — Telephone Encounter (Signed)
Patient notified of refill.

## 2023-01-30 NOTE — Telephone Encounter (Signed)
From: [name] To: Office of Smiley Houseman, [place] Sent: 01/30/2023 9:17 AM EDT Subject: Medication Renewal Request  Refills have been requested for the following medications:   oxyCODONE (ROXICODONE) 5 MG immediate release tablet Drake Leach M]  Preferred pharmacy: [id number] - [place], [place] - [address] AT [facility] [address] Delivery method: Daryll Drown

## 2023-01-30 NOTE — Telephone Encounter (Signed)
DOS: ACDF C3-C4 for myelopathy on 01/14/23   PMP reviewed and refill of oxycodone sent to pharmacy. Please let him know.

## 2023-02-11 ENCOUNTER — Encounter: Payer: Medicare HMO | Admitting: Neurosurgery

## 2023-02-17 ENCOUNTER — Other Ambulatory Visit: Payer: Self-pay | Admitting: Orthopedic Surgery

## 2023-02-17 DIAGNOSIS — Z981 Arthrodesis status: Secondary | ICD-10-CM

## 2023-02-17 DIAGNOSIS — G959 Disease of spinal cord, unspecified: Secondary | ICD-10-CM

## 2023-02-17 MED ORDER — OXYCODONE HCL 5 MG PO TABS
5.0000 mg | ORAL_TABLET | Freq: Four times a day (QID) | ORAL | 0 refills | Status: DC | PRN
Start: 2023-02-17 — End: 2023-04-22

## 2023-02-17 NOTE — Telephone Encounter (Signed)
DOS: ACDF C3-C4 for myelopathy on 01/14/23   PMP reviewed and is appropriate.   Refill of oxycodone sent to the pharmacy. Directions changed to 1 every 6 hours as needed for severe pain.   Please let him know.

## 2023-02-17 NOTE — Telephone Encounter (Signed)
Patient aware of med refill and dosage change

## 2023-02-17 NOTE — Telephone Encounter (Signed)
From: [name] To: Office of Smiley Houseman, [place] Sent: 02/13/2023 9:04 AM EDT Subject: Medication Renewal Request  Refills have been requested for the following medications:   oxyCODONE (ROXICODONE) 5 MG immediate release tablet Travis Bryant M]  Preferred pharmacy: [id number] - [place], [place] - [address] AT [facility] OF [address] Delivery method: Daryll Drown

## 2023-02-24 ENCOUNTER — Other Ambulatory Visit: Payer: Self-pay

## 2023-02-24 DIAGNOSIS — M4802 Spinal stenosis, cervical region: Secondary | ICD-10-CM

## 2023-02-24 DIAGNOSIS — G959 Disease of spinal cord, unspecified: Secondary | ICD-10-CM

## 2023-02-25 ENCOUNTER — Ambulatory Visit
Admission: RE | Admit: 2023-02-25 | Discharge: 2023-02-25 | Disposition: A | Discharge status: Home / Self Care | Payer: Medicare HMO | Source: Ambulatory Visit | Attending: Neurosurgery | Admitting: Neurosurgery

## 2023-02-25 ENCOUNTER — Encounter: Payer: Self-pay | Admitting: Neurosurgery

## 2023-02-25 ENCOUNTER — Ambulatory Visit (INDEPENDENT_AMBULATORY_CARE_PROVIDER_SITE_OTHER): Payer: Medicare HMO | Admitting: Neurosurgery

## 2023-02-25 VITALS — BP 128/78 | Temp 97.8°F | Ht 70.0 in | Wt 167.0 lb

## 2023-02-25 DIAGNOSIS — G959 Disease of spinal cord, unspecified: Secondary | ICD-10-CM

## 2023-02-25 DIAGNOSIS — M4802 Spinal stenosis, cervical region: Secondary | ICD-10-CM

## 2023-02-25 DIAGNOSIS — M5416 Radiculopathy, lumbar region: Secondary | ICD-10-CM

## 2023-02-25 DIAGNOSIS — Z09 Encounter for follow-up examination after completed treatment for conditions other than malignant neoplasm: Secondary | ICD-10-CM

## 2023-02-25 MED ORDER — CELECOXIB 200 MG PO CAPS: 200.0000 mg | ORAL_CAPSULE | Freq: Every day | ORAL | 2 refills | Status: AC

## 2023-02-25 NOTE — Progress Notes (Signed)
   REFERRING PHYSICIAN:  No referring provider defined for this encounter.  DOS: ACDF C3-C4 for myelopathy on 01/14/23  HISTORY OF PRESENT ILLNESS: [name] is status post ACDF C3-C4.   He is still having some pain in between his shoulder blades.  Otherwise, he is doing well.  He did report that he is having pain and tingling down his legs particularly when he walks.  PHYSICAL EXAMINATION:  NEUROLOGICAL:  General: In no acute distress.   Awake, alert, oriented to person, place, and time.  Pupils equal round and reactive to light.  Facial tone is symmetric.    Strength: Side Biceps Triceps Deltoid Interossei Grip Wrist Ext. Wrist Flex.  R 5 5 5 5 5 5 5   L 5 5 5 5 5 5 5    Incision c/d/i  Imaging:  No complications noted  Assessment / Plan: [name] is doing well s/p above surgery.  I have given him some exercises for his neck and reviewed his activity limitations.  I think he is doing well from his neck surgery.  He describes some symptoms of lumbar radiculopathy.  I would like to start him on some physical therapy.  If he is not better at his next clinic appointment, we will proceed with MRI scan of the lumbar spine to drive further workup and management.    Venetia Night MD Dept of Neurosurgery

## 2023-03-04 ENCOUNTER — Telehealth: Payer: Self-pay | Admitting: Neurosurgery

## 2023-03-04 NOTE — Telephone Encounter (Signed)
I spoke with Travis Bryant. He reports he didn't sleep well last night b/c of pain.   He reports pain in his right arm and hand. He states he is having pain in his lower back, shooting to his left leg. The stinging/ pins and needles in his leg has been constant for the past month.  He is also having pain in his abdomen and groin, particularly when urinating, and issues with constipation. His last bowel movement was yesterday. His last dose of oxycodone was about a week ago.    He reports that the pain is not too bad at the time of our call, and he hasn't taken anything since 8am today.   In regards to medications:  He hasn't been taking tylenol Cyclobenzaprine 10mg  - takes "if the pain is really bad" Gabapentin 300mg  TID - he isn't 100% consistent with his timing of taking this Celebrex - takes 200mg  daily Oxycodone - only takes for severe pain  He is waiting to start PT   I suggested that he contact his PCP for the painful urination and constipation issues. I offered to talk to Dr Myer Haff about increasing his gabapentin dose, but he declined at this time. I did explain that he needs to consistently take gabapentin in order for it to work. He states he takes it consistently, but isn't consistent with the timing each day. He will contact his PCP about the urination and constipation issues.  I discussed the above with Dr Myer Haff.

## 2023-03-04 NOTE — Telephone Encounter (Signed)
Pt called in stating he had 8/10 pain last night shooting pain (pins and needles) in left arm and right leg. Also states it takes some effort to use the restroom. Pain with urination and bowel movements. Has taken the senokot which helps but is still taking a lot of effort and there is still pain.

## 2023-03-11 ENCOUNTER — Encounter: Payer: Self-pay | Admitting: Podiatry

## 2023-03-11 ENCOUNTER — Ambulatory Visit: Payer: Medicare HMO | Admitting: Podiatry

## 2023-03-11 DIAGNOSIS — B351 Tinea unguium: Secondary | ICD-10-CM

## 2023-03-11 DIAGNOSIS — M79676 Pain in unspecified toe(s): Secondary | ICD-10-CM

## 2023-03-11 NOTE — Progress Notes (Signed)
This patient presents to the office with chief complaint of long thick painful nails.  Patient says the nails are painful walking and wearing shoes.  This patient is unable to self treat.  This patient is unable to trim his nails since she is unable to reach his nails.  he presents to the office for preventative foot care services.  General Appearance  Alert, conversant and in no acute stress.  Vascular  Dorsalis pedis and posterior tibial  pulses are palpable  bilaterally.  Capillary return is within normal limits  bilaterally. Temperature is within normal limits  bilaterally.  Neurologic  Senn-Weinstein monofilament wire test within normal limits  bilaterally. Muscle power within normal limits bilaterally.  Nails Thick disfigured discolored nails with subungual debris  from hallux to fifth toes bilaterally. No evidence of bacterial infection or drainage bilaterally.  Orthopedic  No limitations of motion  feet .  No crepitus or effusions noted.  No bony pathology or digital deformities noted.  Skin  normotropic skin with no porokeratosis noted bilaterally.  No signs of infections or ulcers noted.     Onychomycosis  Nails  B/L.  Pain in right toes  Pain in left toes  Debridement of nails both feet followed trimming the nails with dremel tool.    RTC 10 weeks    Gardiner Barefoot DPM

## 2023-03-20 ENCOUNTER — Encounter: Payer: Medicare HMO | Admitting: Orthopedic Surgery

## 2023-03-20 ENCOUNTER — Telehealth: Payer: Self-pay | Admitting: Neurosurgery

## 2023-03-20 NOTE — Telephone Encounter (Signed)
Pts dental office called the answering service regarding an upcoming dental cleaning inquiring about antibiotics. Receptionist relayed that someone had already returned their call and answered this question.
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
## 2023-04-03 ENCOUNTER — Encounter: Payer: Medicare HMO | Admitting: Orthopedic Surgery

## 2023-04-08 ENCOUNTER — Other Ambulatory Visit: Payer: Self-pay | Admitting: Orthopedic Surgery

## 2023-04-08 DIAGNOSIS — G959 Disease of spinal cord, unspecified: Secondary | ICD-10-CM

## 2023-04-08 DIAGNOSIS — Z981 Arthrodesis status: Secondary | ICD-10-CM

## 2023-04-08 NOTE — Progress Notes (Deleted)
   REFERRING PHYSICIAN:  [name], Np [address] [place],  [place] [address]  DOS: ACDF C3-C4 for myelopathy on 01/14/23  HISTORY OF PRESENT ILLNESS: Travis Bryant was doing well at his last visit with Dr. Myer Bryant on 02/26/23. He was given some cervical exercises.   He was having some back and leg pain- he was sent to PT for this.    Get lumbar MRI if no better?***        PHYSICAL EXAMINATION:  NEUROLOGICAL:  General: In no acute distress.   Awake, alert, oriented to person, place, and time.  Pupils equal round and reactive to light.  Facial tone is symmetric.    Cranial Nerves: Pupils equal round and reactive to light.  Facial tone is symmetric.    *** ROM of lumbar spine *** pain *** posterior lumbar tenderness.   Cervical incision well healed***  No abnormal lesions on exposed skin.   Strength: Side Biceps Triceps Deltoid Interossei Grip Wrist Ext. Wrist Flex.  R 5 5 5 5 5 5 5   L 5 5 5 5 5 5 5    Side Iliopsoas Quads Hamstring PF DF EHL  R 5 5 5 5 5 5   L 5 5 5 5 5 5    Reflexes are ***2+ and symmetric at the biceps, brachioradialis, patella and achilles.   Hoffman's is absent.  Clonus is not present.   Bilateral upper and lower extremity sensation is intact to light touch.     Gait is normal.     Imaging:  Cervical xrays dated ***:  No complications noted. ***  Radiology report for above xrays not yet available.    Assessment / Plan: Travis Bryant is doing well s/p above surgery. Treatment options reviewed with patient and following plan made:   - We discussed activity escalation and I have advised the patient to lift up to 10 pounds until 6 weeks after surgery (until follow up with Dr. Myer Bryant).   - Reviewed wound care.  - Continue current medications including prn oxycodone and flexeril.  - Discussed restarting neurontin. He will start with 1 q hs and work up to tid. Given instructions.  - Continue with HHPT/OT.  - Follow up as scheduled in 4  weeks and prn.   Advised to contact the office if any questions or concerns arise.   Travis Leach PA-C Dept of Neurosurgery

## 2023-04-09 ENCOUNTER — Encounter: Payer: Medicare HMO | Admitting: Orthopedic Surgery

## 2023-04-10 ENCOUNTER — Encounter (HOSPITAL_COMMUNITY): Payer: Self-pay

## 2023-04-10 ENCOUNTER — Ambulatory Visit (HOSPITAL_COMMUNITY)
Admission: EM | Admit: 2023-04-10 | Discharge: 2023-04-10 | Disposition: A | Discharge status: Home / Self Care | Payer: Medicare HMO | Attending: Physician Assistant | Admitting: Physician Assistant

## 2023-04-10 DIAGNOSIS — U071 COVID-19: Secondary | ICD-10-CM | POA: Diagnosis not present

## 2023-04-10 DIAGNOSIS — Z20822 Contact with and (suspected) exposure to covid-19: Secondary | ICD-10-CM | POA: Diagnosis present

## 2023-04-10 DIAGNOSIS — R0981 Nasal congestion: Secondary | ICD-10-CM | POA: Diagnosis present

## 2023-04-10 DIAGNOSIS — R051 Acute cough: Secondary | ICD-10-CM

## 2023-04-10 DIAGNOSIS — J069 Acute upper respiratory infection, unspecified: Secondary | ICD-10-CM | POA: Diagnosis not present

## 2023-04-10 MED ORDER — PROMETHAZINE-DM 6.25-15 MG/5ML PO SYRP: 5.0000 mL | ORAL_SOLUTION | Freq: Two times a day (BID) | ORAL | 0 refills | Status: DC | PRN
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
## 2023-04-10 NOTE — ED Triage Notes (Signed)
Patient here today with c/o chest and head congestion X 6 days. He just got back from [place], [place] 2 days ago and sister that went with him was diagnosed with Covid today. He has been taking Coricidin with some improvement.

## 2023-04-10 NOTE — Discharge Instructions (Signed)
I believe you have a virus.  We will contact you if you are positive for COVID.  Please monitor your MyChart for these results.  Use Tylenol/ibuprofen for fever and pain.  Use Promethazine DM for cough.  This will make you sleepy so you should not drive or drink alcohol taking it.  Ensure that you rest and drink plenty of fluid.  I recommend you obtain a pulse oximeter from the pharmacy.  These are generally $10-$20.  Monitor your oxygen saturation at home and if this drops below 93% you should be reevaluated and if below 90% you need to be seen immediately in the emergency room.  If your symptoms are not improving within a week please return for reevaluation.  If you have any chest pain, shortness of breath, nausea/vomiting interfering with oral intake, weakness, fever not responding to medication you need to be seen immediately.  

## 2023-04-11 LAB — SARS CORONAVIRUS 2 (TAT 6-24 HRS): SARS Coronavirus 2: POSITIVE — AB

## 2023-04-18 NOTE — Progress Notes (Addendum)
REFERRING PHYSICIAN:  [name], Np [address] [place],  [place] [address]  DOS: ACDF C3-C4 for myelopathy on 01/14/23  HISTORY OF PRESENT ILLNESS: KATHY MARDER was doing well at his last visit with Dr. Myer Haff on 02/26/23. He was given some cervical exercises.   He was having some back and leg pain- he was sent to PT for this.   He has been going to PT for his back at [facility]- did 4 visits. He has seen some relief in his back pain. He has minimal LBP with constant left leg pain to his foot. He has weakness, numbness, and tingling in left leg. No right leg pain. Left leg is his primary complaint.   He has no significant neck or arm pain. He still has to take his time with eating, but this has gotten better. He notes he can't sing as well as he could preop.   Some relief with neurontin.   No bowel or bladder issues.    PHYSICAL EXAMINATION:  NEUROLOGICAL:  General: In no acute distress.   Awake, alert, oriented to person, place, and time.  Pupils equal round and reactive to light.  Facial tone is symmetric.    Cranial Nerves: Pupils equal round and reactive to light.  Facial tone is symmetric.    No posterior lumbar tenderness.   Cervical incision well healed.   No abnormal lesions on exposed skin.   Strength: Side Biceps Triceps Deltoid Interossei Grip Wrist Ext. Wrist Flex.  R 5 5 5 5 5 5 5   L 5 5 5 5 5 5 5    Side Iliopsoas Quads Hamstring PF DF EHL  R 5 5 5 5 5 5   L 5 5 5 5 5 5    Reflexes are 2+ and symmetric at the biceps, brachioradialis, patella and achilles.   Hoffman's is absent.  Clonus is not present.   Bilateral upper and lower extremity sensation is intact to light touch.     Gait is normal.     Imaging:  Cervical xrays dated 04/22/23:  No complications noted.   Radiology report for above xrays not yet available.    Assessment / Plan: ATHEL ELBERS is doing well s/p above surgery.    He has seen improvement in LBP with PT, but still  has constant left posterior leg pain to his foot. He has numbness, tingling, and weakness in his left leg. No right leg pain.   Treatment options reviewed with patient and following plan made:   - Increase neurontin- he was doing 300mg  tid. Will do 300mg  bid and 600mg  at night. Reviewed dosing and side effects.  - Continue with PT for lumbar spine at [facility].  - Recommend lumbar MRI to evaluate left lumbar radiculopathy. He wants to give PT more time.  - Discussed referral to laryngeal specialist ([facility])f for his voice. He wants to hold off for now.   - Phone visit scheduled with me in 4 weeks. He will also need postop f/u with Dr. Myer Haff scheduled in 4-5 months at his phone visit.   Advised to contact the office if any questions or concerns arise.   I spent a total of 15 minutes in face-to-face and non-face-to-face activities related to this patient's care today including review of outside records, review of imaging, review of symptoms, physical exam, discussion of differential diagnosis, discussion of treatment options, and documentation of his lower back and leg pain.   Drake Leach PA-C Dept of Neurosurgery

## 2023-04-22 ENCOUNTER — Ambulatory Visit
Admission: RE | Admit: 2023-04-22 | Discharge: 2023-04-22 | Disposition: A | Discharge status: Home / Self Care | Payer: Medicare HMO | Attending: Orthopedic Surgery | Admitting: Orthopedic Surgery

## 2023-04-22 ENCOUNTER — Ambulatory Visit
Admission: RE | Admit: 2023-04-22 | Discharge: 2023-04-22 | Disposition: A | Discharge status: Home / Self Care | Payer: Medicare HMO | Source: Ambulatory Visit | Attending: Orthopedic Surgery | Admitting: Orthopedic Surgery

## 2023-04-22 ENCOUNTER — Ambulatory Visit (INDEPENDENT_AMBULATORY_CARE_PROVIDER_SITE_OTHER): Payer: Medicare HMO | Admitting: Orthopedic Surgery

## 2023-04-22 ENCOUNTER — Encounter: Payer: Self-pay | Admitting: Orthopedic Surgery

## 2023-04-22 VITALS — BP 133/72 | Ht 70.0 in | Wt 162.0 lb

## 2023-04-22 DIAGNOSIS — Z09 Encounter for follow-up examination after completed treatment for conditions other than malignant neoplasm: Secondary | ICD-10-CM | POA: Diagnosis not present

## 2023-04-22 DIAGNOSIS — M5416 Radiculopathy, lumbar region: Secondary | ICD-10-CM | POA: Diagnosis not present

## 2023-04-22 DIAGNOSIS — Z981 Arthrodesis status: Secondary | ICD-10-CM | POA: Insufficient documentation

## 2023-04-22 DIAGNOSIS — G959 Disease of spinal cord, unspecified: Secondary | ICD-10-CM | POA: Insufficient documentation

## 2023-04-22 NOTE — Patient Instructions (Signed)
It was nice to see you today.   You can take the gabapentin 300mg  in the morning, 300mg  at lunch, and 600mg  (2 pills) at night. Call if any issues.   Continue with PT for your back.   We have a phone follow up in 4 weeks. If no improvement, we can discuss getting MRI of your lower back.   Please call with any questions or concerns.   Drake Leach PA-C [phone]

## 2023-05-06 ENCOUNTER — Telehealth: Payer: Self-pay | Admitting: Orthopedic Surgery

## 2023-05-06 NOTE — Telephone Encounter (Signed)
Does he want to keep taking neurontin 300mg  tid or does he want to try doing neurontin 300mg  bid and 600mg  at night?

## 2023-05-06 NOTE — Telephone Encounter (Signed)
LMOM for patient to return call.

## 2023-05-06 NOTE — Telephone Encounter (Signed)
At his last visit you increased his Neurontin, he never did start the new dosage. He is down to 2 pills only. Can you send in a new rx to [facility] on [address]. He will be leaving for [place] in the next few days and he is still having discomfort.

## 2023-05-07 NOTE — Telephone Encounter (Signed)
Patient notified and voiced understanding. He will call and let us know how he is taking medication when he is close to running out.

## 2023-05-07 NOTE — Telephone Encounter (Signed)
I spoke to patient and he states he thought the RX was sent to the pharmacy yesterday so he picked it up. The RX states he is to take 1 pill TID. I told him I would let you know and call him back. I told him I thought it would be ok to take the extra 1 pill but he will run out of the RX sooner. Do you want him to call when he is running out of this prescription then send in the new script?

## 2023-05-07 NOTE — Telephone Encounter (Signed)
Okay to do 300mg  in am and at lunch with 600mg  at night if he wants to increase it.   If not, he can stay on 300mg  tid.   If he does increase it, let me know when he runs out.

## 2023-05-14 ENCOUNTER — Telehealth: Payer: Self-pay | Admitting: Neurosurgery

## 2023-05-14 NOTE — Telephone Encounter (Signed)
Patient called to ask if Dr.Yarbrough would be willing to fill out a health form for him this week as he is getting ready to travel to [place] on 05/19/2023. He states the form asks questions about his ability to do certain things and whether or not the patient is healthy enough to perform certain activities. He was unsure if Dr. Myer Haff would fill this out since he had surgery back back in June. Please advise.

## 2023-05-14 NOTE — Telephone Encounter (Signed)
Recommend he have PCP fill this out.

## 2023-05-20 ENCOUNTER — Ambulatory Visit: Payer: Medicare HMO | Admitting: Podiatry

## 2023-05-21 ENCOUNTER — Telehealth: Payer: Medicare HMO | Admitting: Orthopedic Surgery

## 2023-06-03 ENCOUNTER — Ambulatory Visit: Payer: Medicare HMO | Admitting: Allergy & Immunology

## 2023-06-08 NOTE — Progress Notes (Unsigned)
REFERRING PHYSICIAN:  [name], [address] [place],  [place] [address]  DOS: ACDF C3-C4 for myelopathy on 01/14/23  HISTORY OF PRESENT ILLNESS: Travis Bryant was was doing well s/p above cervical surgery. Discussed referral to laryngeal specialist Irene Pap) for his voice. He declined.   His primary complaint at his last visit was constant LBP with constant right posterior leg pain to his foot. He has numbness, tingling, and weakness in his right leg. No left leg pain.   He was started on neurontin at his last visit and was to continue with PT. We discussed lumbar MRI and he declined.   He is here for follow up.        He was having some back and leg pain- he was sent to PT for this.   He has been going to PT for his back at [facility]- did 4 visits. He has seen some relief in his back pain. He has minimal LBP with constant left leg pain to his foot. He has weakness, numbness, and tingling in left leg. No right leg pain. Left leg is his primary complaint.   He has no significant neck or arm pain. He still has to take his time with eating, but this has gotten better. He notes he can't sing as well as he could preop.   Some relief with neurontin.   No bowel or bladder issues.    PHYSICAL EXAMINATION:  NEUROLOGICAL:  General: In no acute distress.   Awake, alert, oriented to person, place, and time.  Pupils equal round and reactive to light.  Facial tone is symmetric.    Cranial Nerves: Pupils equal round and reactive to light.  Facial tone is symmetric.    No posterior lumbar tenderness.   Cervical incision well healed.   No abnormal lesions on exposed skin.   Strength: Side Biceps Triceps Deltoid Interossei Grip Wrist Ext. Wrist Flex.  R 5 5 5 5 5 5 5   L 5 5 5 5 5 5 5    Side Iliopsoas Quads Hamstring PF DF EHL  R 5 5 5 5 5 5   L 5 5 5 5 5 5    Reflexes are 2+ and symmetric at the biceps, brachioradialis, patella and achilles.   Hoffman's is absent.   Clonus is not present.   Bilateral upper and lower extremity sensation is intact to light touch.     Gait is normal.     Imaging:  None  Assessment / Plan: Travis Bryant is doing well s/p above surgery.    He has seen improvement in LBP with PT, but still has constant right posterior leg pain to his foot. He has numbness, tingling, and weakness in his right leg. No left leg pain.   Treatment options reviewed with patient and following plan made:   - Increase neurontin- he was doing 300mg  tid. Will do 300mg  bid and 600mg  at night. Reviewed dosing and side effects.  - Continue with PT for lumbar spine at [facility].  - Recommend lumbar MRI to evaluate right lumbar radiculopathy. He wants to give PT more time.  - Discussed referral to laryngeal specialist ([facility])f for his voice. He wants to hold off for now.   - Phone visit scheduled with me in 4 weeks. He will also need postop f/u with Dr. Myer Haff scheduled in 4-5 months at his phone visit.   Advised to contact the office if any questions or concerns arise.   I spent a  total of 15 minutes in face-to-face and non-face-to-face activities related to this patient's care today including review of outside records, review of imaging, review of symptoms, physical exam, discussion of differential diagnosis, discussion of treatment options, and documentation of his lower back and leg pain.   Drake Leach PA-C Dept of Neurosurgery

## 2023-06-09 ENCOUNTER — Encounter: Payer: Self-pay | Admitting: Podiatry

## 2023-06-09 ENCOUNTER — Ambulatory Visit: Payer: Medicare HMO | Admitting: Podiatry

## 2023-06-09 DIAGNOSIS — B351 Tinea unguium: Secondary | ICD-10-CM | POA: Diagnosis not present

## 2023-06-09 DIAGNOSIS — M79676 Pain in unspecified toe(s): Secondary | ICD-10-CM | POA: Diagnosis not present

## 2023-06-09 NOTE — Progress Notes (Signed)
This patient presents to the office with chief complaint of long thick painful nails.  Patient says the nails are painful walking and wearing shoes.  This patient is unable to self treat.  This patient is unable to trim his nails since she is unable to reach his nails.  he presents to the office for preventative foot care services.  General Appearance  Alert, conversant and in no acute stress.  Vascular  Dorsalis pedis and posterior tibial  pulses are palpable  bilaterally.  Capillary return is within normal limits  bilaterally. Temperature is within normal limits  bilaterally.  Neurologic  Senn-Weinstein monofilament wire test within normal limits  bilaterally. Muscle power within normal limits bilaterally.  Nails Thick disfigured discolored nails with subungual debris  from hallux to fifth toes bilaterally. No evidence of bacterial infection or drainage bilaterally.  Orthopedic  No limitations of motion  feet .  No crepitus or effusions noted.  No bony pathology or digital deformities noted.  Skin  normotropic skin with no porokeratosis noted bilaterally.  No signs of infections or ulcers noted.     Onychomycosis  Nails  B/L.  Pain in right toes  Pain in left toes  Debridement of nails both feet followed trimming the nails with dremel tool.    RTC 10 weeks    Gardiner Barefoot DPM

## 2023-06-10 ENCOUNTER — Ambulatory Visit: Payer: Medicare HMO | Admitting: Orthopedic Surgery

## 2023-06-10 ENCOUNTER — Encounter: Payer: Self-pay | Admitting: Orthopedic Surgery

## 2023-06-10 VITALS — BP 110/68 | Ht 70.0 in | Wt 162.0 lb

## 2023-06-10 DIAGNOSIS — G959 Disease of spinal cord, unspecified: Secondary | ICD-10-CM | POA: Diagnosis not present

## 2023-06-10 DIAGNOSIS — M5416 Radiculopathy, lumbar region: Secondary | ICD-10-CM

## 2023-06-10 DIAGNOSIS — Z981 Arthrodesis status: Secondary | ICD-10-CM | POA: Diagnosis not present

## 2023-06-10 DIAGNOSIS — M5442 Lumbago with sciatica, left side: Secondary | ICD-10-CM | POA: Diagnosis not present

## 2023-06-10 NOTE — Patient Instructions (Signed)
It was so nice to see you today. Thank you so much for coming in.   I want to get an MRI of your lower back to look into things further. We will get this approved through your insurance and [facility] will call you to schedule the appointment.   After you have the MRI, it takes 10-14 days for me to get the results back. Once I have them, we will call you to schedule a follow up phone visit with me to review them.   Okay to restart PT for your lower back.   I made you a follow up with Dr. Myer Haff for your neck. You will xrays prior to this visit.   Please do not hesitate to call if you have any questions or concerns. You can also message me in MyChart.   Drake Leach PA-C [phone]     The physicians and staff at [facility] at [facility] are committed to providing excellent care. You may receive a survey asking for feedback about your experience at our office. We value you your feedback and appreciate you taking the time to to fill it out. The [facility] leadership team is also available to discuss your experience in person, feel free to contact us [phone].

## 2023-06-11 ENCOUNTER — Encounter: Payer: Self-pay | Admitting: Family

## 2023-06-11 ENCOUNTER — Ambulatory Visit (INDEPENDENT_AMBULATORY_CARE_PROVIDER_SITE_OTHER): Payer: Medicare HMO | Admitting: Family

## 2023-06-11 ENCOUNTER — Other Ambulatory Visit: Payer: Self-pay

## 2023-06-11 VITALS — BP 102/60 | HR 92 | Temp 97.4°F | Resp 16 | Wt 155.3 lb

## 2023-06-11 DIAGNOSIS — K219 Gastro-esophageal reflux disease without esophagitis: Secondary | ICD-10-CM | POA: Diagnosis not present

## 2023-06-11 DIAGNOSIS — J4520 Mild intermittent asthma, uncomplicated: Secondary | ICD-10-CM | POA: Diagnosis not present

## 2023-06-11 DIAGNOSIS — J3089 Other allergic rhinitis: Secondary | ICD-10-CM

## 2023-06-11 NOTE — Patient Instructions (Addendum)
1. Asthma - Daily controller medication(s): Arnuity 200 mcg 1 puff once aday EVERY DAY to help prevent cough wheeze. Demonstrated proper use   - Prior to physical activity: Xopenex 2 puffs 10-15 minutes before physical activity.   - Rescue medications: Xopenex 4 puffs every 4-6 hours as needed    - Asthma control goals:  * Full participation in all desired activities (may need albuterol before activity) * Albuterol use two time or less a week on average (not counting use with activity) * Cough interfering with sleep two time or less a month * Oral steroids no more than once a year * No hospitalizations  2.  Perennial and seasonal allergic rhinitis (grasses, dust mite) - Continue Flonase 1-2 sprays in each nostril once a day EVERY DAY for the best effect.  - Continue Xyzal (levocetirizine) once a day as needed for a runny nose.  3. Reflux - Continue dietary and lifestyle modifications  - Continue famotidine 20 mg twice a day as needed for control of reflux. - May use omeprazole 40mg  daily to help with the reflux symptoms (this can work with the Pepcid to help your reflux symptoms).   4. Schedule a follow up appointment in 3-4 months or soonre if needed

## 2023-06-11 NOTE — Progress Notes (Signed)
[address] [place] [address] Dept: [phone]  FOLLOW UP NOTE  Patient ID: Travis Bryant, male    DOB: 07-Nov-1960  Age: 62 y.o. MRN: [id number] Date of Office Visit: 06/11/2023  Assessment  Chief Complaint: Follow-up (SOB with exertion. )  HPI Travis Bryant is a 62 year old male who presents today for follow-up of mild intermittent asthma without complication, allergic rhinitis, gastroesophageal reflux disease, food intolerance, and chronic back pain.  He was last seen on November 02, 2022 by Dr. Dellis Anes.  He reports since his last office visit on January 13, 2023 he had a C3-C4 anterior cervical disectomy and fusion.  He reports some days he feels great and other days he is having pain.  He also just got back from [place]  He was there from October 7 through October 24.  Asthma: He reports that he has been using leftover Flovent 2 puffs twice a day except for the past 4 days.  He feels like his breathing is better when he is on it.  He does have the Arnuity inhaler that his insurance preferred. He was uncertain how to use this inhaler. He also has Xopenex to use as needed.  He reports a little bit of shortness of breath with exertion or if he is having back pain.  He reports that this is occurring maybe 2-3 times a week.  He denies use cough, wheezing, tightness in chest, and nocturnal awakenings due to breathing problems.  Since his last office visit he has not required any systemic steroids or made any trips to the emergency room or urgent care due to breathing problems.  Perennial and seasonal allergic rhinitis: He reports he had a little bit of rhinorrhea, nasal congestion, and postnasal drip while he was in [place].He wonders if the spicy foods caused these symptoms.  He has not been treated for any sinus infections since we last saw him.  He has Flonase nasal spray to use as needed and azelastine nasal spray as needed.  He does continue to take Xyzal 5 mg once a  day.  Reflux is reported as not having to deal with it too much.  He has famotidine that he takes as needed.   Drug Allergies:  No Known Allergies  Review of Systems: Negative except as per HPI   Physical Exam: BP 102/60   Pulse 92   Temp (!) 97.4 F (36.3 C) (Temporal)   Resp 16   Wt 155 lb 4.8 oz (70.4 kg)   SpO2 97%   BMI 22.28 kg/m    Physical Exam Constitutional:      Appearance: Normal appearance.  HENT:     Head: Normocephalic and atraumatic.     Comments: Pharynx normal. Eyes normal. Ears normal. Nose normal    Right Ear: Tympanic membrane, ear canal and external ear normal.     Left Ear: Tympanic membrane, ear canal and external ear normal.     Nose: Nose normal.     Mouth/Throat:     Mouth: Mucous membranes are moist.     Pharynx: Oropharynx is clear.  Eyes:     Conjunctiva/sclera: Conjunctivae normal.  Cardiovascular:     Rate and Rhythm: Regular rhythm.     Heart sounds: Normal heart sounds.  Pulmonary:     Effort: Pulmonary effort is normal.     Breath sounds: Normal breath sounds.     Comments: Lungs clear to auscultation Musculoskeletal:     Cervical back: Neck supple.  Skin:    General: Skin is warm.  Neurological:     Mental Status: He is alert and oriented to person, place, and time.  Psychiatric:        Mood and Affect: Mood normal.        Behavior: Behavior normal.        Thought Content: Thought content normal.        Judgment: Judgment normal.     Diagnostics: FVC 4.00 L (104%), FEV1 3.20 L (108%), FEV1/FVC 0.80.  Spirometry indicates normal spirometry.  Assessment and Plan: 1. Mild intermittent asthma without complication   2. Other allergic rhinitis   3. Gastroesophageal reflux disease, unspecified whether esophagitis present     No orders of the defined types were placed in this encounter.   Patient Instructions  1. Asthma - Daily controller medication(s): Arnuity 200 mcg 1 puff once aday EVERY DAY to help prevent cough  wheeze. Demonstrated proper use   - Prior to physical activity: Xopenex 2 puffs 10-15 minutes before physical activity.   - Rescue medications: Xopenex 4 puffs every 4-6 hours as needed    - Asthma control goals:  * Full participation in all desired activities (may need albuterol before activity) * Albuterol use two time or less a week on average (not counting use with activity) * Cough interfering with sleep two time or less a month * Oral steroids no more than once a year * No hospitalizations  2.  Perennial and seasonal allergic rhinitis (grasses, dust mite) - Continue Flonase 1-2 sprays in each nostril once a day EVERY DAY for the best effect.  - Continue Xyzal (levocetirizine) once a day as needed for a runny nose.  3. Reflux - Continue dietary and lifestyle modifications  - Continue famotidine 20 mg twice a day as needed for control of reflux. - May use omeprazole 40mg  daily to help with the reflux symptoms (this can work with the Pepcid to help your reflux symptoms).   4. Schedule a follow up appointment in 3-4 months or soonre if needed       Return in about 3 months (around 09/11/2023), or if symptoms worsen or fail to improve.    Thank you for the opportunity to care for this patient.  Please do not hesitate to contact me with questions.  Nehemiah Settle, FNP [facility]

## 2023-06-18 DIAGNOSIS — M4802 Spinal stenosis, cervical region: Secondary | ICD-10-CM

## 2023-06-18 DIAGNOSIS — G959 Disease of spinal cord, unspecified: Secondary | ICD-10-CM

## 2023-06-18 DIAGNOSIS — M5416 Radiculopathy, lumbar region: Secondary | ICD-10-CM

## 2023-06-29 ENCOUNTER — Ambulatory Visit
Admission: RE | Admit: 2023-06-29 | Discharge: 2023-06-29 | Disposition: A | Discharge status: Home / Self Care | Payer: Medicare HMO | Source: Ambulatory Visit | Attending: Orthopedic Surgery | Admitting: Orthopedic Surgery

## 2023-06-29 DIAGNOSIS — M5442 Lumbago with sciatica, left side: Secondary | ICD-10-CM

## 2023-06-29 DIAGNOSIS — M5416 Radiculopathy, lumbar region: Secondary | ICD-10-CM

## 2023-07-15 ENCOUNTER — Telehealth: Payer: Self-pay | Admitting: Neurosurgery

## 2023-07-15 DIAGNOSIS — M5416 Radiculopathy, lumbar region: Secondary | ICD-10-CM

## 2023-07-15 DIAGNOSIS — M5442 Lumbago with sciatica, left side: Secondary | ICD-10-CM

## 2023-07-15 NOTE — Telephone Encounter (Signed)
  Media Information   Document Information  Left message for patient to call the office to get additional information.

## 2023-07-15 NOTE — Telephone Encounter (Signed)
Gabapentin 300mg  3x aday [facility] on [address]  He is completely out of medication. Disregard the part of the message that mentions the boil.

## 2023-07-15 NOTE — Telephone Encounter (Signed)
Gabapentin was prescribed by his PCP. Please direct him to call her office. Thank you.

## 2023-07-15 NOTE — Telephone Encounter (Signed)
His PCP was prescribing neurontin, but I increased the dose so I can refill.   Please confirm, is he taking 300mg  in am and at lunch with 600mg  at night?   Let me know and I can send in a refill.

## 2023-07-15 NOTE — Telephone Encounter (Signed)
Spoke to patient and he has confirmed Gabapentin 300mg  in am and at lunch with 600mg  at night.

## 2023-07-16 MED ORDER — GABAPENTIN 300 MG PO CAPS
ORAL_CAPSULE | ORAL | 1 refills | Status: AC
Start: 2023-07-16 — End: ?

## 2023-07-16 NOTE — Addendum Note (Signed)
Addended byDrake Leach on: 07/16/2023 08:24 AM   Modules accepted: Orders

## 2023-07-16 NOTE — Telephone Encounter (Signed)
Patient notified

## 2023-07-16 NOTE — Telephone Encounter (Signed)
Neurontin sent to pharmacy. Please let him know.

## 2023-07-17 ENCOUNTER — Ambulatory Visit: Payer: Medicare HMO | Admitting: Podiatry

## 2023-07-17 ENCOUNTER — Encounter: Payer: Self-pay | Admitting: Podiatry

## 2023-07-17 DIAGNOSIS — M79676 Pain in unspecified toe(s): Secondary | ICD-10-CM | POA: Diagnosis not present

## 2023-07-17 DIAGNOSIS — L989 Disorder of the skin and subcutaneous tissue, unspecified: Secondary | ICD-10-CM

## 2023-07-17 DIAGNOSIS — B351 Tinea unguium: Secondary | ICD-10-CM

## 2023-07-17 MED ORDER — MUPIROCIN 2 % EX OINT: 1.0000 | TOPICAL_OINTMENT | Freq: Two times a day (BID) | CUTANEOUS | 2 refills | Status: AC

## 2023-07-17 NOTE — Progress Notes (Signed)
Subjective: Chief Complaint  Patient presents with   Foot Ulcer    Rm#11 Spot on left foot since having neck surgery/nail trim   61 year old male presents outside for concerns of a sore on the top of his left foot.  States that started off as a blister.  This is getting better if he was ever checked.  No pain.  No swelling or redness or any drainage.  Also he is asking for the nail to be trimmed today as are thickened elongated he cannot trim them himself.  Objective: AAO x3, NAD DP/PT pulses palpable bilaterally, CRT less than 3 seconds Notably hypertrophic, dystrophic with fungal debris present.  There is no edema, erythema or signs of infection noted today. Dorsal aspect the left foot is what appears to be a scab likely forming.  He states that this started off as a blister.  There is no edema, erythema, infection.  No drainage or pus. No pain with calf compression, swelling, warmth, erythema    Assessment: Skin lesion left foot, onychomycosis  Plan: -All treatment options discussed with the patient including all alternatives, risks, complications.  -Prescribing Pearson ointment to apply to the area of the wound.  Looks like it starting to scab over.  Since this time off as a blister and is progressing this.  Will continue to monitor this but there is no improvement next appointment recommend biopsy. -As a courtesy debride the nails without any complications or bleeding.  Vivi Barrack DPM

## 2023-07-17 NOTE — Patient Instructions (Signed)
  Choose a moisturizer from  the list below:  For normal skin: Moisturize feet once daily; do not apply between toes A.  CeraVe Daily Moisturizing Lotion B.  Lubriderm Advanced Therapy Lotion or Lubriderm Intense Skin Repair Lotion C.  Aquaphor Intensive Repair Lotion D.  Gold Bond Ultimate Diabetic Foot Lotion E.  Eucerin Intensive Repair Moisturizing Lotion  For extremely dry, cracked feet: moisturize feet once daily; do not apply between toes A. CeraVe Healing Ointment B. Eucerin Aquaphor Repairing Ointment (may be labeled Aquaphor Healing Ointment) C. Vaseline Petroleum Healing Jelly   If you have problems reaching your feet: apply to feet once daily; do not apply between toes A.  Eucerin Aquaphor Ointment Body Spray  B.  Vaseline Intensive Care Spray Moisturizer (Unscented,  Cocoa Radiant Spray or Aloe Smooth Spray)

## 2023-07-21 ENCOUNTER — Other Ambulatory Visit: Payer: Self-pay

## 2023-07-21 DIAGNOSIS — G959 Disease of spinal cord, unspecified: Secondary | ICD-10-CM

## 2023-07-22 ENCOUNTER — Ambulatory Visit
Admission: RE | Admit: 2023-07-22 | Discharge: 2023-07-22 | Disposition: A | Discharge status: Home / Self Care | Payer: Medicare HMO | Source: Ambulatory Visit | Attending: Neurosurgery | Admitting: Neurosurgery

## 2023-07-22 ENCOUNTER — Encounter: Payer: Self-pay | Admitting: Neurosurgery

## 2023-07-22 ENCOUNTER — Ambulatory Visit
Admission: RE | Admit: 2023-07-22 | Discharge: 2023-07-22 | Disposition: A | Discharge status: Home / Self Care | Payer: Medicare HMO | Attending: Neurosurgery | Admitting: Neurosurgery

## 2023-07-22 ENCOUNTER — Ambulatory Visit: Payer: Medicare HMO | Admitting: Neurosurgery

## 2023-07-22 VITALS — BP 128/78 | Ht 70.0 in | Wt 155.0 lb

## 2023-07-22 DIAGNOSIS — G959 Disease of spinal cord, unspecified: Secondary | ICD-10-CM

## 2023-07-22 DIAGNOSIS — M5416 Radiculopathy, lumbar region: Secondary | ICD-10-CM

## 2023-07-22 DIAGNOSIS — M48062 Spinal stenosis, lumbar region with neurogenic claudication: Secondary | ICD-10-CM | POA: Diagnosis not present

## 2023-07-22 NOTE — Progress Notes (Signed)
   REFERRING PHYSICIAN:  [name], [address] [place],  [place] [address]  DOS: ACDF C3-C4 for myelopathy on 01/14/23  HISTORY OF PRESENT ILLNESS: Travis Bryant is status post ACDF C3-C4.  He is doing well from that.  He is having some pain on the left leg.  This is worse when he stands or walks.  PHYSICAL EXAMINATION:  NEUROLOGICAL:  General: In no acute distress.   Awake, alert, oriented to person, place, and time.  Pupils equal round and reactive to light.  Facial tone is symmetric.    Strength: Side Biceps Triceps Deltoid Interossei Grip Wrist Ext. Wrist Flex.  R 5 5 5 5 5 5 5   L 5 5 5 5 5 5 5    Incision c/d/i  Imaging:  No complications noted  MRI L spine 06/29/2023 IMPRESSION: 1. Lumbar spondylosis with multilevel disc disease and facet disease. 2. Moderate to moderately severe multifactorial spinal and bilateral lateral recess stenosis and bilateral foraminal encroachment at L4-5. 3. Right-sided extraforaminal disc osteophyte complex at L3-4 with potential irritation of the extraforaminal right L3 nerve root.     Electronically Signed   By: Rudie Meyer M.D.   On: 07/19/2023 10:51  Assessment / Plan: Travis Bryant is doing well s/p above surgery.  I have given him some exercises for his neck and reviewed his activity limitations.  I think he is doing well from his neck surgery.  He describes some symptoms of lumbar radiculopathy and neurogenic claudication.  He would like to continue physical therapy.  I will see him back in 2 months.  I spent a total of 15 minutes in this patient's care today. This time was spent reviewing pertinent records including imaging studies, obtaining and confirming history, performing a directed evaluation, formulating and discussing my recommendations, and documenting the visit within the medical record.   Venetia Night MD Dept of Neurosurgery

## 2023-07-27 NOTE — Progress Notes (Unsigned)
Telephone Visit- Progress Note: Referring Physician:  [name], NP [address] [place],  [place] [address]  Primary Physician:  [name], NP  This visit was performed via telephone.  Patient location: home Provider location: office  I spent a total of *** minutes non-face-to-face activities for this visit on the date of this encounter including review of current clinical condition and response to treatment.    Patient has given verbal consent to this telephone visits and we reviewed the limitations of a telephone visit. Patient wishes to proceed.    Chief Complaint:  review lumbar MRI scan  History of Present Illness: Travis Bryant is a 62 y.o. male has a history of HTN, migraines, asthma, GERD, BPH, and hyperlipidemia.    Last seen by Dr. Myer Haff on 07/22/23 for follow up of ACDF C3-C4 for myelopathy on 01/14/23.   He has intermittent LBP with intermittent left lateral leg pain to his foot. He has numbness and tingling. He feels some giving way of left leg and this is affecting his balance. No right leg issues. Left leg is his primary complaint.    He continues on neurontin.    Exam: No exam done as this was a telephone encounter.     Imaging: Lumbar MRI scan dated 06/29/23:  FINDINGS: Segmentation: There are five lumbar type vertebral bodies. The last full intervertebral disc space is labeled L5-S1.   Alignment:  Normal overall alignment of the lumbar vertebral bodies.   Vertebrae:  Normal marrow signal.  No bone lesions or fractures.   Conus medullaris and cauda equina: Conus extends to the L1-2 level. Conus and cauda equina appear normal.   Paraspinal and other soft tissues: No significant paraspinal or retroperitoneal findings. The bladder demonstrates moderate trabeculation.   Disc levels:   T12-L1: No significant findings.   L1-2: No significant findings.   L2-3: Mild facet disease and slight annular bulge with mild lateral recess  encroachment but no spinal or foraminal stenosis.   L3-4: Mild to moderate facet disease and mild bulging annulus. Mild lateral recess encroachment but no spinal or foraminal stenosis. Right-sided extraforaminal disc osteophyte complex with potential irritation of the extraforaminal right L3 nerve root.   L4-5: Advanced facet disease and slight degenerative anterolisthesis of L4. There is a bulging uncovered disc and osteophytic ridging. This in combination with the facet disease, ligamentum flavum thickening and short pedicles contributes to moderate to moderately severe spinal and bilateral lateral recess stenosis and bilateral foraminal encroachment.   L5-S1: Advanced facet disease but no disc protrusions, spinal foraminal stenosis.   IMPRESSION: 1. Lumbar spondylosis with multilevel disc disease and facet disease. 2. Moderate to moderately severe multifactorial spinal and bilateral lateral recess stenosis and bilateral foraminal encroachment at L4-5. 3. Right-sided extraforaminal disc osteophyte complex at L3-4 with potential irritation of the extraforaminal right L3 nerve root.     Electronically Signed   By: Rudie Meyer M.D.   On: 07/19/2023 10:51  I have personally reviewed the images and agree with the above interpretation.  Assessment and Plan: [name] is a pleasant 62 y.o. male with ***  Treatment options discussed with patient and following plan made:   - Order for physical therapy for *** spine ***. Patient to call to schedule appointment. *** - Continue current medications including ***. Reviewed dosing and side effects.  - Prescription for ***. Reviewed dosing and side effects. Take with food.  - Prescription for *** to take prn muscle spasms. Reviewed dosing and side  effects. Discussed this can cause drowsiness.  - MRI of *** to further evaluate *** radiculopathy. No improvement time or medications (***).  - Referral to [facility]R at [facility] to discuss possible ***  injections.  - Will schedule phone visit to review MRI results once I get them back.   Drake Leach PA-C Neurosurgery

## 2023-07-28 ENCOUNTER — Telehealth: Payer: Medicare HMO | Admitting: Orthopedic Surgery

## 2023-07-29 NOTE — Progress Notes (Unsigned)
Telephone Visit- Progress Note: Referring Physician:  [name], NP [address] [place],  [place] [address]  Primary Physician:  [name], NP  This visit was performed via telephone.  Patient location: home Provider location: office  I spent a total of *** minutes non-face-to-face activities for this visit on the date of this encounter including review of current clinical condition and response to treatment.    Patient has given verbal consent to this telephone visits and we reviewed the limitations of a telephone visit. Patient wishes to proceed.    Chief Complaint:  review lumbar MRI scan  History of Present Illness: Travis Bryant is a 62 y.o. male has a history of HTN, migraines, asthma, GERD, BPH, and hyperlipidemia.    Last seen by Dr. Myer Haff on 07/22/23 for follow up of ACDF C3-C4 for myelopathy on 01/14/23.   He has intermittent LBP with intermittent left lateral leg pain to his foot. He has numbness and tingling. He feels some giving way of left leg and this is affecting his balance. No right leg issues. Left leg is his primary complaint.    He continues on neurontin.    Exam: No exam done as this was a telephone encounter.     Imaging: Lumbar MRI scan dated 06/29/23:  FINDINGS: Segmentation: There are five lumbar type vertebral bodies. The last full intervertebral disc space is labeled L5-S1.   Alignment:  Normal overall alignment of the lumbar vertebral bodies.   Vertebrae:  Normal marrow signal.  No bone lesions or fractures.   Conus medullaris and cauda equina: Conus extends to the L1-2 level. Conus and cauda equina appear normal.   Paraspinal and other soft tissues: No significant paraspinal or retroperitoneal findings. The bladder demonstrates moderate trabeculation.   Disc levels:   T12-L1: No significant findings.   L1-2: No significant findings.   L2-3: Mild facet disease and slight annular bulge with mild lateral recess  encroachment but no spinal or foraminal stenosis.   L3-4: Mild to moderate facet disease and mild bulging annulus. Mild lateral recess encroachment but no spinal or foraminal stenosis. Right-sided extraforaminal disc osteophyte complex with potential irritation of the extraforaminal right L3 nerve root.   L4-5: Advanced facet disease and slight degenerative anterolisthesis of L4. There is a bulging uncovered disc and osteophytic ridging. This in combination with the facet disease, ligamentum flavum thickening and short pedicles contributes to moderate to moderately severe spinal and bilateral lateral recess stenosis and bilateral foraminal encroachment.   L5-S1: Advanced facet disease but no disc protrusions, spinal foraminal stenosis.   IMPRESSION: 1. Lumbar spondylosis with multilevel disc disease and facet disease. 2. Moderate to moderately severe multifactorial spinal and bilateral lateral recess stenosis and bilateral foraminal encroachment at L4-5. 3. Right-sided extraforaminal disc osteophyte complex at L3-4 with potential irritation of the extraforaminal right L3 nerve root.     Electronically Signed   By: Rudie Meyer M.D.   On: 07/19/2023 10:51  I have personally reviewed the images and agree with the above interpretation.  Assessment and Plan: [name] is a pleasant 62 y.o. male with ***  Treatment options discussed with patient and following plan made:   - Order for physical therapy for *** spine ***. Patient to call to schedule appointment. *** - Continue current medications including ***. Reviewed dosing and side effects.  - Prescription for ***. Reviewed dosing and side effects. Take with food.  - Prescription for *** to take prn muscle spasms. Reviewed dosing and side  effects. Discussed this can cause drowsiness.  - MRI of *** to further evaluate *** radiculopathy. No improvement time or medications (***).  - Referral to [facility]R at [facility] to discuss possible ***  injections.  - Will schedule phone visit to review MRI results once I get them back.   Drake Leach PA-C Neurosurgery

## 2023-07-30 ENCOUNTER — Encounter: Payer: Self-pay | Admitting: Orthopedic Surgery

## 2023-07-30 ENCOUNTER — Ambulatory Visit: Payer: Medicare HMO | Admitting: Orthopedic Surgery

## 2023-07-30 DIAGNOSIS — M47816 Spondylosis without myelopathy or radiculopathy, lumbar region: Secondary | ICD-10-CM

## 2023-07-30 DIAGNOSIS — M4316 Spondylolisthesis, lumbar region: Secondary | ICD-10-CM | POA: Diagnosis not present

## 2023-07-30 DIAGNOSIS — M4722 Other spondylosis with radiculopathy, cervical region: Secondary | ICD-10-CM

## 2023-07-30 DIAGNOSIS — M48061 Spinal stenosis, lumbar region without neurogenic claudication: Secondary | ICD-10-CM | POA: Diagnosis not present

## 2023-07-30 DIAGNOSIS — M5416 Radiculopathy, lumbar region: Secondary | ICD-10-CM

## 2023-08-12 ENCOUNTER — Telehealth: Payer: Medicare HMO | Admitting: Orthopedic Surgery

## 2023-09-07 ENCOUNTER — Encounter (HOSPITAL_COMMUNITY): Payer: Self-pay

## 2023-09-07 ENCOUNTER — Ambulatory Visit (HOSPITAL_COMMUNITY)
Admission: EM | Admit: 2023-09-07 | Discharge: 2023-09-07 | Disposition: A | Discharge status: Home / Self Care | Payer: Medicare HMO | Attending: Physician Assistant | Admitting: Physician Assistant

## 2023-09-07 DIAGNOSIS — J069 Acute upper respiratory infection, unspecified: Secondary | ICD-10-CM

## 2023-09-07 LAB — POCT INFLUENZA A/B
Influenza A, POC: NEGATIVE
Influenza B, POC: NEGATIVE

## 2023-09-07 MED ORDER — PROMETHAZINE-DM 6.25-15 MG/5ML PO SYRP: 5.0000 mL | ORAL_SOLUTION | Freq: Four times a day (QID) | ORAL | 0 refills | Status: AC | PRN

## 2023-09-07 NOTE — Discharge Instructions (Addendum)
Can take cough medicine as prescribed Continue with inhaler as needed for wheezing and/or shortness of breath Recommend Mucinex and Flonase for congestion and postnasal drip Follow up with your primary care physician

## 2023-09-07 NOTE — ED Triage Notes (Signed)
Pt states that he has a cough and fatigue. X3-4 days  Pt states that he has some back pain and body aches. X5-6 months  Pt states that he had a procedure in July 2024 that is causing the back pain and body aches.  Pt states that she also has some abdominal pain since the procedure.
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
## 2023-09-08 LAB — SARS CORONAVIRUS 2 (TAT 6-24 HRS): SARS Coronavirus 2: NEGATIVE

## 2023-09-11 ENCOUNTER — Ambulatory Visit: Payer: Self-pay | Admitting: Cardiology

## 2023-09-22 ENCOUNTER — Other Ambulatory Visit: Payer: Self-pay

## 2023-09-22 DIAGNOSIS — G959 Disease of spinal cord, unspecified: Secondary | ICD-10-CM

## 2023-09-23 ENCOUNTER — Encounter: Payer: Self-pay | Admitting: Neurosurgery

## 2023-09-23 ENCOUNTER — Ambulatory Visit
Admission: RE | Admit: 2023-09-23 | Discharge: 2023-09-23 | Disposition: A | Discharge status: Home / Self Care | Payer: Medicare HMO | Source: Ambulatory Visit | Attending: Neurosurgery | Admitting: Neurosurgery

## 2023-09-23 ENCOUNTER — Ambulatory Visit (INDEPENDENT_AMBULATORY_CARE_PROVIDER_SITE_OTHER): Payer: Medicare HMO | Admitting: Neurosurgery

## 2023-09-23 ENCOUNTER — Ambulatory Visit
Admission: RE | Admit: 2023-09-23 | Discharge: 2023-09-23 | Disposition: A | Discharge status: Home / Self Care | Payer: Medicare HMO | Attending: Neurosurgery | Admitting: Neurosurgery

## 2023-09-23 VITALS — BP 114/78 | Ht 70.0 in | Wt 160.0 lb

## 2023-09-23 DIAGNOSIS — G959 Disease of spinal cord, unspecified: Secondary | ICD-10-CM | POA: Diagnosis present

## 2023-09-23 DIAGNOSIS — M4316 Spondylolisthesis, lumbar region: Secondary | ICD-10-CM

## 2023-09-23 DIAGNOSIS — M5416 Radiculopathy, lumbar region: Secondary | ICD-10-CM

## 2023-09-23 DIAGNOSIS — M48062 Spinal stenosis, lumbar region with neurogenic claudication: Secondary | ICD-10-CM

## 2023-09-23 NOTE — Progress Notes (Signed)
   REFERRING PHYSICIAN:  [name], [address] [place],  [place] [address]  DOS: ACDF C3-C4 for myelopathy on 01/14/23  HISTORY OF PRESENT ILLNESS: Travis Bryant is status post ACDF C3-C4.  He is doing well from that.  He is having some pain on the left leg.  This is worse when he stands or walks.  He has tried physical therapy more than once.  He continues to have pain worse on his left leg.  PHYSICAL EXAMINATION:  NEUROLOGICAL:  General: In no acute distress.   Awake, alert, oriented to person, place, and time.  Pupils equal round and reactive to light.  Facial tone is symmetric.    Strength: Side Biceps Triceps Deltoid Interossei Grip Wrist Ext. Wrist Flex.  R 5 5 5 5 5 5 5   L 5 5 5 5 5 5 5    Incision c/d/i  Imaging:  No complications noted  MRI L spine 06/29/2023 IMPRESSION: 1. Lumbar spondylosis with multilevel disc disease and facet disease. 2. Moderate to moderately severe multifactorial spinal and bilateral lateral recess stenosis and bilateral foraminal encroachment at L4-5. 3. Right-sided extraforaminal disc osteophyte complex at L3-4 with potential irritation of the extraforaminal right L3 nerve root.     Electronically Signed   By: Rudie Meyer M.D.   On: 07/19/2023 10:51  Assessment / Plan: Travis Bryant is doing well s/p above surgery.  I have given him some exercises for his neck and reviewed his activity limitations.  I think he is doing well from his neck surgery.  He describes some symptoms of lumbar radiculopathy and neurogenic claudication.  He has anterolisthesis /spondylolisthesis at L4-5.  He is very hesitant to consider any surgical interventions.  I will refer him to consider injections.  I will be happy to see him back in the future.  If he gets to the point of considering surgery, he will need flexion-extension x-rays of the lumbar spine to assess the stability of his anterolisthesis.    I spent a total of 15 minutes in this  patient's care today. This time was spent reviewing pertinent records including imaging studies, obtaining and confirming history, performing a directed evaluation, formulating and discussing my recommendations, and documenting the visit within the medical record.   Venetia Night MD Dept of Neurosurgery

## 2023-10-01 ENCOUNTER — Ambulatory Visit: Payer: Medicare HMO | Admitting: Cardiology
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
## 2023-10-10 ENCOUNTER — Encounter: Payer: Self-pay | Admitting: Nurse Practitioner

## 2023-10-10 ENCOUNTER — Ambulatory Visit: Payer: Medicare HMO | Attending: Nurse Practitioner | Admitting: Nurse Practitioner

## 2023-10-10 VITALS — BP 134/70 | HR 73 | Ht 70.0 in | Wt 157.0 lb

## 2023-10-10 DIAGNOSIS — E785 Hyperlipidemia, unspecified: Secondary | ICD-10-CM

## 2023-10-10 DIAGNOSIS — Z87891 Personal history of nicotine dependence: Secondary | ICD-10-CM

## 2023-10-10 DIAGNOSIS — Z8249 Family history of ischemic heart disease and other diseases of the circulatory system: Secondary | ICD-10-CM

## 2023-10-10 DIAGNOSIS — R079 Chest pain, unspecified: Secondary | ICD-10-CM

## 2023-10-10 DIAGNOSIS — Q245 Malformation of coronary vessels: Secondary | ICD-10-CM

## 2023-10-10 DIAGNOSIS — I10 Essential (primary) hypertension: Secondary | ICD-10-CM

## 2023-10-10 NOTE — Patient Instructions (Addendum)
 Medication Instructions:  Your physician recommends that you continue on your current medications as directed. Please refer to the Current Medication list given to you today.  *If you need a refill on your cardiac medications before your next appointment, please call your pharmacy*  Lab Work: None ordered today. If you have labs (blood work) drawn today and your tests are completely normal, you will receive your results only by: MyChart Message (if you have MyChart) OR A paper copy in the mail If you have any lab test that is abnormal or we need to change your treatment, we will call you to review the results.  Testing/Procedures: None ordered today.  Follow-Up: At [facility], you and your health needs are our priority.  As part of our continuing mission to provide you with exceptional heart care, we have created designated Provider Care Teams.  These Care Teams include your primary Cardiologist (physician) and Advanced Practice Providers (APPs -  Physician Assistants and Nurse Practitioners) who all work together to provide you with the care you need, when you need it.   Your next appointment:   6 month(s)  The format for your next appointment:   In Person  Provider:   Robin Searing, NP {  Other Instructions How to Prepare for Your Cardiac PET/CT Stress Test:  1. Please do not take these medications before your test:  ~Medications that may interfere with the cardiac pharmacological stress agent (ex. nitrates - including erectile dysfunction medications, isosorbide mononitrate, tamulosin or beta-blockers) the day of the exam. (Erectile dysfunction medication should be held for at least 72 hrs prior to test) ~Theophylline containing medications for 12 hours. ~Dipyridamole 48 hours prior to the test. ~Your remaining medications may be taken with water.  2. Nothing to eat or drink, except water, 3 hours prior to arrival time.   ~ NO caffeine/decaffeinated products, or chocolate  12 hours prior to arrival.  3. NO perfume, cologne or lotion on chest or abdomen area.  4. Total time is 1 to 2 hours; you may want to bring reading material for the waiting time.  Please report to Radiology at the [facility] Main Entrance 30 minutes early for your test. [address] [place], [place] [address]  In preparation for your appointment, medication and supplies will be purchased.  Appointment availability is limited, so if you need to cancel or reschedule, please call the Radiology Department at [phone] [facility]) OR [phone] [facility])  24 hours in advance to avoid a cancellation fee of $100.00  What to Expect After you Arrive:  Once you arrive and check in for your appointment, you will be taken to a preparation room within the Radiology Department.  A technologist or Nurse will obtain your medical history, verify that you are correctly prepped for the exam, and explain the procedure.  Afterwards,  an IV will be started in your arm and electrodes will be placed on your skin for EKG monitoring during the stress portion of the exam. Then you will be escorted to the PET/CT scanner.  There, staff will get you positioned on the scanner and obtain a blood pressure and EKG.  During the exam, you will continue to be connected to the EKG and blood pressure machines.  A small, safe amount of a radioactive tracer will be injected in your IV to obtain a series of pictures of your heart along with an injection of a stress agent.    After your Exam:  It is recommended that  you eat a meal and drink a caffeinated beverage to counter act any effects of the stress agent.  Drink plenty of fluids for the remainder of the day and urinate frequently for the first couple of hours after the exam.  Your doctor will inform you of your test results within 7-10 business days.  For more information and frequently asked questions, please visit our website :  [email or web address]  For questions about your test or how to prepare for your test, please call: Cardiac Imaging Nurse Navigators Office: [phone]

## 2023-10-16 ENCOUNTER — Ambulatory Visit: Payer: Medicare HMO | Admitting: Podiatry

## 2023-10-16 ENCOUNTER — Encounter: Payer: Self-pay | Admitting: Podiatry

## 2023-10-16 DIAGNOSIS — B351 Tinea unguium: Secondary | ICD-10-CM | POA: Diagnosis not present

## 2023-10-16 DIAGNOSIS — M79676 Pain in unspecified toe(s): Secondary | ICD-10-CM

## 2023-10-16 NOTE — Progress Notes (Signed)
 Subjective: Chief Complaint  Patient presents with   RFC    RM#51 RFC    63 year old male presents outside for the above the concerns. The spot on the top of the foot has been doing great. He gets tingling, stinging on the left foot. He gets sharp pain going down the left leg.  He does follow with neurosurgery.  Objective: AAO x3, NAD DP/PT pulses palpable bilaterally, CRT less than 3 seconds Toenails are hypertrophic, dystrophic with fungal debris present.  Tenderness nails 1-5 bilaterally.  There is no edema, erythema or signs of infection noted today. Lesion on the dorsal left foot is resolved.  There is no skin breakdown or ulcerations identified today. No pain with calf compression, swelling, warmth, erythema  Assessment: Symptomatic onychomycosis  Plan: -All treatment options discussed with the patient including all alternatives, risks, complications.  -Sharply debrided nails x 10 without any complications or bleeding. -Lesion on the dorsal close resolved.  Monitor any recurrence. -Recommend follow-up with neurosurgery for the left leg pain  Return in about 3 months (around 01/16/2024).  Vivi Barrack DPM

## 2023-11-01 ENCOUNTER — Other Ambulatory Visit: Payer: Self-pay | Admitting: Allergy & Immunology

## 2023-11-04 ENCOUNTER — Other Ambulatory Visit (HOSPITAL_COMMUNITY): Payer: Self-pay

## 2023-11-04 NOTE — Progress Notes (Signed)
 NEW REFERRAL TO CPP CLINIC    HPI: Travis Bryant is a 63 y.o. male who presents to the [facility] pharmacy clinic to discuss and initiate PrEP.  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   Mild hyperlipidemia 09/10/2021   Mild intermittent asthma without complication 05/28/2019   Angina pectoris (HCC) 09/09/2018   Family history of early CAD 09/09/2018   Asthma with acute exacerbation 04/21/2017   Hypertensive disorder 01/02/2017   Benign essential HTN 12/16/2016   Dyspnea 12/16/2016   Memory impairment 11/06/2016   Disorder of bladder 08/22/2016   Injury due to exposure to external cause 08/22/2016   History of hematuria 08/22/2016   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/2016   Increased frequency of urination 08/22/2016   History of head injury 07/30/2016   Acute sinusitis 12/27/2015   Mild persistent asthma 12/27/2015   Seasonal and perennial allergic rhinitis 12/27/2015   Gastroesophageal reflux disease 12/27/2015   Cough, persistent 12/27/2015   Seasonal and perennial allergic rhinitis 12/27/2015   Tendinitis of shoulder 08/29/2015   Benign prostatic hyperplasia without urinary obstruction 06/21/2015   History of tuberculosis 06/21/2015   Asthma 06/19/2015   Environmental allergies 06/19/2015   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  Previous Medications   ACETAMINOPHEN (TYLENOL) 325 MG TABLET    Take 650 mg by mouth every 6 (six) hours as needed (for pain).     AMLODIPINE (NORVASC) 5 MG TABLET    TAKE 1 TABLET(5 MG) BY MOUTH DAILY   ATORVASTATIN (LIPITOR) 10 MG TABLET    TAKE 1 TABLET(10 MG) BY MOUTH DAILY   AZELASTINE (ASTELIN) 0.1 % NASAL SPRAY    Place 2 sprays into both nostrils 2 (two) times daily.   CYCLOBENZAPRINE (FLEXERIL) 10 MG TABLET    Take 1 tablet (10 mg total) by mouth 3 (three) times daily as needed for muscle spasms.   DESCOVY 200-25 MG TABLET    Take 1 tablet by mouth daily.   FAMOTIDINE (PEPCID) 20 MG TABLET    Take 1 tablet (20 mg total) by mouth daily.   FLUTICASONE (FLONASE) 50 MCG/ACT NASAL SPRAY    Place 1-2 sprays into both nostrils daily as needed.   FLUTICASONE (FLOVENT HFA) 220 MCG/ACT INHALER    Inhale 2 puffs into the lungs 2 (two) times daily.   FLUTICASONE FUROATE (ARNUITY ELLIPTA) 200 MCG/ACT AEPB    Inhale 1 puff into the lungs daily.   GABAPENTIN (NEURONTIN) 300 MG CAPSULE    300mg  po in the morning and at lunch and 600mg  po q hs   IPRATROPIUM (ATROVENT) 0.03 % NASAL SPRAY    Place 2 sprays into both nostrils 2 (two) times daily.   LEVALBUTEROL (XOPENEX HFA) 45 MCG/ACT INHALER    Inhale 2 puffs into the lungs every 4 (four) hours as needed.   LEVOCETIRIZINE (XYZAL) 5 MG TABLET    TAKE 1 TABLET(5 MG) BY MOUTH EVERY EVENING   METOPROLOL SUCCINATE (  TOPROL-XL) 50 MG 24 HR TABLET    TAKE 1 TABLET(50 MG) BY MOUTH DAILY WITH OR IMMEDIATELY FOLLOWING A MEAL   MUPIROCIN OINTMENT (BACTROBAN) 2 %    Apply 1 Application topically 2 (two) times daily.   OMEPRAZOLE (PRILOSEC) 40 MG CAPSULE    Take 1 capsule (40 mg total) by mouth daily.   PROMETHAZINE-DEXTROMETHORPHAN (PROMETHAZINE-DM) 6.25-15 MG/5ML SYRUP    Take 5 mLs by mouth 4 (four) times daily as needed for cough.   ROPINIROLE (REQUIP) 0.5 MG TABLET    Take 0.5 mg by mouth at bedtime.   SENNA (SENOKOT) 8.6 MG TABS TABLET    Take 1 tablet (8.6 mg total) by mouth 2 (two) times daily.   TADALAFIL (CIALIS) 5 MG TABLET    Take 5 mg by mouth daily as needed.    TAMSULOSIN  (FLOMAX) 0.4 MG CAPS CAPSULE    Take 0.8 mg by mouth daily.  Modified Medications   No medications on file  Discontinued Medications   No medications on file       11/05/2023    2:56 PM  CHL HIV PREP FLOWSHEET RESULTS  Insurance Status Insured  Gender at birth Male  Gender identity cis-Male  Risk for HIV Hx of STI;Condomless vaginal or anal intercourse  Sex Partners Both men and women  # sex partners past 3-6 mos 1  Sex activity preferences Insertive  Condom use No  Treated for STI? No  HIV symptoms? None  PrEP Eligibility Yes    Labs:  SCr: Lab Results  Component Value Date   CREATININE 1.11 11/05/2023   CREATININE 1.21 01/08/2023   CREATININE 1.29 (H) 05/28/2019   CREATININE 1.24 04/29/2019   CREATININE 1.30 (H) 04/08/2019   HIV No results found for: "HIV" Hepatitis B Lab Results  Component Value Date   HEPBSAB NON-REACTIVE 11/05/2023   HEPBSAG NON-REACTIVE 11/05/2023   HEPBCAB NON-REACTIVE 11/05/2023   Hepatitis C Lab Results  Component Value Date   HEPCAB NON-REACTIVE 11/05/2023   Hepatitis A Lab Results  Component Value Date   HAV REACTIVE (A) 11/05/2023   RPR and STI Lab Results  Component Value Date   LABRPR NON-REACTIVE 11/05/2023    STI Results GC CT  11/05/2023  3:03 PM Negative  Negative     Assessment: Travis Bryant is here today to discuss initiating Apretude injections for HIV PrEP. He has been prescribed Descovy for his PrEP regimen for the last 3-4 years but has had intermittent abdominal pain lately and believes the Descovy is the cause. He stopped Descovy ~2 weeks ago.  Last HIV testing was negative on 09/25/23. Labs also from that visit showed a SCr of 1.26, nonreactive RPR, and negative for gonorrhea and chlamydia. He also currently takes doxyPEP managed by the health department.   He has both men and women partners and has had 1 partner in the last 3 months (male). He only engages in insertive anal intercourse. No receptive anal or oral.  He was treated for gonorrhea ~10  years ago and has never been prescribed PEP. He does not use IV drugs or stimulants. He states that his current partner is not HIV positive but is married to a women. He is unsure if he has other male or male partners besides himself and his wife.  Counseled that Apretude is one intramuscular injection in the gluteal muscle for each visit. Explained that the second injection is 30 days after the initial injection then every 2 months thereafter. Explained that showing up to  injection appointments is very important and warned that if appointments are missed, protection will be minimal and the risk of acquiring HIV becomes much higher. Counseled on possible side effects associated with the injections such as injection site pain, which is usually mild to moderate in nature, injection site nodules, and injection site reactions. Advised that he can take Motrin or Tylenol for injection site pain if needed. Answered all questions. He would like to move forward with initiating the injections. His insurance covers Apretude with a $0 charge at [facility].   Will check labs today and have him come back next week to begin. All questions and concerns answered.  Plan: - HIV RNA, Hepatitis serologies, CMP, RPR, and urine cytology today - Start Apretude next week on 11/12/23  Travis Bryant, PharmD, BCIDP, AAHIVP, CPP Clinical Pharmacist Practitioner - Infectious Diseases Clinical Pharmacist Lead - Specialty Pharmacy [facility] 11/10/2023, 9:02 AM

## 2023-11-05 ENCOUNTER — Ambulatory Visit: Admitting: Pharmacist

## 2023-11-05 ENCOUNTER — Other Ambulatory Visit: Payer: Self-pay | Admitting: Student

## 2023-11-05 ENCOUNTER — Other Ambulatory Visit: Payer: Self-pay

## 2023-11-05 ENCOUNTER — Other Ambulatory Visit (HOSPITAL_COMMUNITY)
Admission: RE | Admit: 2023-11-05 | Discharge: 2023-11-05 | Disposition: A | Discharge status: Home / Self Care | Source: Ambulatory Visit | Attending: Infectious Disease | Admitting: Infectious Disease

## 2023-11-05 DIAGNOSIS — Z114 Encounter for screening for human immunodeficiency virus [HIV]: Secondary | ICD-10-CM | POA: Diagnosis not present

## 2023-11-05 DIAGNOSIS — Z113 Encounter for screening for infections with a predominantly sexual mode of transmission: Secondary | ICD-10-CM | POA: Insufficient documentation

## 2023-11-05 DIAGNOSIS — K4090 Unilateral inguinal hernia, without obstruction or gangrene, not specified as recurrent: Secondary | ICD-10-CM

## 2023-11-05 DIAGNOSIS — Z7189 Other specified counseling: Secondary | ICD-10-CM | POA: Diagnosis not present

## 2023-11-05 DIAGNOSIS — N400 Enlarged prostate without lower urinary tract symptoms: Secondary | ICD-10-CM

## 2023-11-05 DIAGNOSIS — Z79899 Other long term (current) drug therapy: Secondary | ICD-10-CM

## 2023-11-05 DIAGNOSIS — Z2981 Encounter for HIV pre-exposure prophylaxis: Secondary | ICD-10-CM | POA: Diagnosis not present

## 2023-11-06 ENCOUNTER — Other Ambulatory Visit (HOSPITAL_COMMUNITY): Payer: Self-pay

## 2023-11-06 ENCOUNTER — Other Ambulatory Visit: Payer: Self-pay

## 2023-11-06 ENCOUNTER — Other Ambulatory Visit: Payer: Self-pay | Admitting: Pharmacist

## 2023-11-06 DIAGNOSIS — Z7189 Other specified counseling: Secondary | ICD-10-CM

## 2023-11-06 LAB — URINE CYTOLOGY ANCILLARY ONLY
Chlamydia: NEGATIVE
Comment: NEGATIVE
Comment: NORMAL
Neisseria Gonorrhea: NEGATIVE

## 2023-11-06 MED ORDER — APRETUDE 600 MG/3ML IM SUER
600.0000 mg | INTRAMUSCULAR | 1 refills | Status: DC
  Filled 2023-11-06: qty 3, 30d supply, fill #0
  Filled 2023-12-08: qty 3, 30d supply, fill #1

## 2023-11-06 NOTE — Progress Notes (Signed)
 Specialty Pharmacy Initial Fill Coordination Note  Travis Bryant is a 63 y.o. male contacted today regarding initial fill of specialty medication(s) Cabotegravir (Apretude)   Patient requested Courier to Provider Office   Delivery date: 11/10/23   Verified address: [facility] [address] [place] [address]   Medication will be filled on 11/07/23.   Patient is aware of $0 copayment.

## 2023-11-07 ENCOUNTER — Other Ambulatory Visit: Payer: Self-pay

## 2023-11-07 LAB — HEPATITIS B CORE ANTIBODY, TOTAL: Hep B Core Total Ab: NONREACTIVE

## 2023-11-07 LAB — RPR: RPR Ser Ql: NONREACTIVE

## 2023-11-07 LAB — HEPATITIS B SURFACE ANTIGEN: Hepatitis B Surface Ag: NONREACTIVE

## 2023-11-07 LAB — COMPREHENSIVE METABOLIC PANEL WITH GFR
AG Ratio: 1.5 (calc) (ref 1.0–2.5)
ALT: 12 U/L (ref 9–46)
AST: 16 U/L (ref 10–35)
Albumin: 4.2 g/dL (ref 3.6–5.1)
Alkaline phosphatase (APISO): 52 U/L (ref 35–144)
BUN: 17 mg/dL (ref 7–25)
CO2: 25 mmol/L (ref 20–32)
Calcium: 9.1 mg/dL (ref 8.6–10.3)
Chloride: 108 mmol/L (ref 98–110)
Creat: 1.11 mg/dL (ref 0.70–1.35)
Globulin: 2.8 g/dL (ref 1.9–3.7)
Glucose, Bld: 98 mg/dL (ref 65–99)
Potassium: 4 mmol/L (ref 3.5–5.3)
Sodium: 141 mmol/L (ref 135–146)
Total Bilirubin: 0.9 mg/dL (ref 0.2–1.2)
Total Protein: 7 g/dL (ref 6.1–8.1)
eGFR: 75 mL/min/{1.73_m2} (ref >=60)

## 2023-11-07 LAB — HEPATITIS C ANTIBODY: Hepatitis C Ab: NONREACTIVE

## 2023-11-07 LAB — HEPATITIS B SURFACE ANTIBODY,QUALITATIVE: Hep B S Ab: NONREACTIVE

## 2023-11-07 LAB — HEPATITIS A ANTIBODY, TOTAL: Hepatitis A AB,Total: REACTIVE — AB

## 2023-11-07 LAB — HIV-1 RNA QUANT-NO REFLEX-BLD
HIV 1 RNA Quant: NOT DETECTED {copies}/mL
HIV-1 RNA Quant, Log: NOT DETECTED {Log_copies}/mL

## 2023-11-10 ENCOUNTER — Telehealth: Payer: Self-pay

## 2023-11-10 NOTE — Telephone Encounter (Signed)
 [facility] Patient Advocate Encounter  Patient's medications APRETUDE have been couriered to [facility] from [facility] and will be administered at the patients appointment on 11/12/23.  Kae Heller, CPhT Specialty Pharmacy Patient [facility] for Infectious Disease Phone: [phone] Fax:  [phone]

## 2023-11-11 ENCOUNTER — Ambulatory Visit
Admission: RE | Admit: 2023-11-11 | Discharge: 2023-11-11 | Disposition: A | Discharge status: Home / Self Care | Source: Ambulatory Visit | Attending: Student

## 2023-11-11 DIAGNOSIS — N400 Enlarged prostate without lower urinary tract symptoms: Secondary | ICD-10-CM

## 2023-11-11 DIAGNOSIS — K4090 Unilateral inguinal hernia, without obstruction or gangrene, not specified as recurrent: Secondary | ICD-10-CM

## 2023-11-11 MED ORDER — IOPAMIDOL (ISOVUE-300) INJECTION 61%
100.0000 mL | Freq: Once | INTRAVENOUS | Status: AC | PRN
  Administered 2023-11-11: 100 mL via INTRAVENOUS

## 2023-11-11 NOTE — Progress Notes (Unsigned)
 HPI: Travis Bryant is a 63 y.o. male who presents to the [facility] pharmacy clinic for Apretude administration and HIV PrEP follow up.  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   Mild hyperlipidemia 09/10/2021   Mild intermittent asthma without complication 05/28/2019   Angina pectoris (HCC) 09/09/2018   Family history of early CAD 09/09/2018   Asthma with acute exacerbation 04/21/2017   Hypertensive disorder 01/02/2017   Benign essential HTN 12/16/2016   Dyspnea 12/16/2016   Memory impairment 11/06/2016   Disorder of bladder 08/22/2016   Injury due to exposure to external cause 08/22/2016   History of hematuria 08/22/2016   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/2016   Increased frequency of urination 08/22/2016   History of head injury 07/30/2016   Acute sinusitis 12/27/2015   Mild persistent asthma 12/27/2015   Seasonal and perennial allergic rhinitis 12/27/2015   Gastroesophageal reflux disease 12/27/2015   Cough, persistent 12/27/2015   Seasonal and perennial allergic rhinitis 12/27/2015   Tendinitis of shoulder 08/29/2015   Benign prostatic hyperplasia without urinary obstruction 06/21/2015   History of tuberculosis 06/21/2015   Asthma 06/19/2015   Environmental allergies 06/19/2015   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    AMLODIPINE (NORVASC) 5 MG TABLET    TAKE 1 TABLET(5 MG) BY MOUTH DAILY   ATORVASTATIN (LIPITOR) 10  MG TABLET    TAKE 1 TABLET(10 MG) BY MOUTH DAILY   AZELASTINE (ASTELIN) 0.1 % NASAL SPRAY    Place 2 sprays into both nostrils 2 (two) times daily.   CABOTEGRAVIR ER (APRETUDE) 600 MG/3ML INJECTION    Inject 3 mLs (600 mg total) into the muscle every 30 (thirty) days.   CYCLOBENZAPRINE (FLEXERIL) 10 MG TABLET    Take 1 tablet (10 mg total) by mouth 3 (three) times daily as needed for muscle spasms.   DESCOVY 200-25 MG TABLET    Take 1 tablet by mouth daily.   FAMOTIDINE (PEPCID) 20 MG TABLET    Take 1 tablet (20 mg total) by mouth daily.   FLUTICASONE (FLONASE) 50 MCG/ACT NASAL SPRAY    Place 1-2 sprays into both nostrils daily as needed.   FLUTICASONE (FLOVENT HFA) 220 MCG/ACT INHALER    Inhale 2 puffs into the lungs 2 (two) times daily.   FLUTICASONE FUROATE (ARNUITY ELLIPTA) 200 MCG/ACT AEPB    Inhale 1 puff into the lungs daily.   GABAPENTIN (NEURONTIN) 300 MG CAPSULE    300mg  po in the morning and at lunch and 600mg  po q hs   IPRATROPIUM (ATROVENT) 0.03 % NASAL SPRAY    Place 2 sprays into both nostrils 2 (two) times daily.   LEVALBUTEROL (XOPENEX HFA) 45 MCG/ACT INHALER    Inhale 2 puffs into the lungs every 4 (four) hours as needed.   LEVOCETIRIZINE (XYZAL) 5 MG TABLET    TAKE 1 TABLET(5 MG) BY MOUTH EVERY EVENING  METOPROLOL SUCCINATE (TOPROL-XL) 50 MG 24 HR TABLET    TAKE 1 TABLET(50 MG) BY MOUTH DAILY WITH OR IMMEDIATELY FOLLOWING A MEAL   MUPIROCIN OINTMENT (BACTROBAN) 2 %    Apply 1 Application topically 2 (two) times daily.   OMEPRAZOLE (PRILOSEC) 40 MG CAPSULE    Take 1 capsule (40 mg total) by mouth daily.   PROMETHAZINE-DEXTROMETHORPHAN (PROMETHAZINE-DM) 6.25-15 MG/5ML SYRUP    Take 5 mLs by mouth 4 (four) times daily as needed for cough.   ROPINIROLE (REQUIP) 0.5 MG TABLET    Take 0.5 mg by mouth at bedtime.   SENNA (SENOKOT) 8.6 MG TABS TABLET    Take 1 tablet (8.6 mg total) by mouth 2 (two) times daily.   TADALAFIL (CIALIS) 5 MG TABLET    Take 5 mg by mouth daily as needed.     TAMSULOSIN (FLOMAX) 0.4 MG CAPS CAPSULE    Take 0.8 mg by mouth daily.  Modified Medications   No medications on file  Discontinued Medications   No medications on file    Allergies: No Known Allergies  Labs: Lab Results  Component Value Date   HIV1RNAQUANT NOT DETECTED 11/05/2023    RPR and STI Lab Results  Component Value Date   LABRPR NON-REACTIVE 11/05/2023    STI Results GC CT  11/05/2023  3:03 PM Negative  Negative     Hepatitis B Lab Results  Component Value Date   HEPBSAB NON-REACTIVE 11/05/2023   HEPBSAG NON-REACTIVE 11/05/2023   HEPBCAB NON-REACTIVE 11/05/2023   Hepatitis C Lab Results  Component Value Date   HEPCAB NON-REACTIVE 11/05/2023   Hepatitis A Lab Results  Component Value Date   HAV REACTIVE (A) 11/05/2023   Lipids: Lab Results  Component Value Date   CHOL 121 09/27/2019   TRIG 57 09/27/2019   HDL 52 09/27/2019   LDLCALC 56 09/27/2019    Current PrEP Regimen: Descovy (off x 3 weeks)  TARGET DATE: Today - the 2nd  Assessment: Theresa presents today for their first initiation injection of Apretude and to follow up for HIV PrEP. I saw him last week to discuss starting Apretude. Checked all baseline labs and none were concerning. He has documented immunity to Hepatitis A but not Hepatitis B. Offered the Hep B vaccine today and he agrees to receive. Hepatitis C antibody was negative and STI testing for gonorrhea, chlamydia, and syphilis were all negative. He was also concerned that his SCr was trending up but results from last week showed SCr within normal limits at 1.11. No concerns today.  Per [employer] guidelines, a rapid HIV test should be drawn prior to Apretude administration. Due to state shortage of rapid HIV tests, this is temporarily unable to be done. Per decision from [facility] physicians, we will proceed with Apretude administration at this time without a negative rapid HIV test beforehand. HIV RNA was collected today and is in  process.  Administered cabotegravir 600mg /86mL in left upper outer quadrant of the gluteal muscle. Monitored patient for 10 minutes after injection. Injection was tolerated well without issue. Counseled to call with any issues that may arise. Will make follow up appointments for second initiation injection in 30 days and then maintenance injections every 2 months thereafter.   Plan: - First Apretude injection administered - Second initiation injection scheduled for 12/10/23 - Call with any issues or questions  Ade Stmarie L. Burnette Valenti, PharmD, BCIDP, AAHIVP, CPP Clinical Pharmacist Practitioner - Infectious Diseases Clinical Pharmacist Lead - Specialty Pharmacy [facility] for Infectious  Disease 11/11/2023, 4:23 PM

## 2023-11-12 ENCOUNTER — Ambulatory Visit: Admitting: Pharmacist

## 2023-11-12 ENCOUNTER — Other Ambulatory Visit: Payer: Self-pay

## 2023-11-12 DIAGNOSIS — Z2981 Encounter for HIV pre-exposure prophylaxis: Secondary | ICD-10-CM | POA: Diagnosis not present

## 2023-11-12 DIAGNOSIS — Z23 Encounter for immunization: Secondary | ICD-10-CM | POA: Diagnosis not present

## 2023-11-12 MED ORDER — CABOTEGRAVIR ER 600 MG/3ML IM SUER
600.0000 mg | Freq: Once | INTRAMUSCULAR | Status: AC
  Administered 2023-11-12: 600 mg via INTRAMUSCULAR

## 2023-11-26 ENCOUNTER — Other Ambulatory Visit

## 2023-12-01 ENCOUNTER — Encounter (HOSPITAL_COMMUNITY): Payer: Self-pay

## 2023-12-03 ENCOUNTER — Encounter (HOSPITAL_COMMUNITY)

## 2023-12-03 ENCOUNTER — Encounter (HOSPITAL_COMMUNITY): Payer: Self-pay

## 2023-12-08 ENCOUNTER — Other Ambulatory Visit (HOSPITAL_COMMUNITY): Payer: Self-pay

## 2023-12-08 ENCOUNTER — Other Ambulatory Visit: Payer: Self-pay

## 2023-12-08 NOTE — Progress Notes (Signed)
 Specialty Pharmacy Refill Coordination Note  Travis Bryant is a 62 y.o. male assessed today regarding refills of clinic administered specialty medication(s) Cabotegravir  (Apretude )   Clinic requested Courier to Provider Office   Delivery date: 12/10/23   Verified address: [address] [place] [address]   Medication will be filled on 12/09/23.

## 2023-12-09 NOTE — Progress Notes (Unsigned)
 HPI: Travis Bryant is a 63 y.o. male who presents to the [facility] pharmacy clinic for Apretude  administration and HIV PrEP follow up.  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   Mild hyperlipidemia 09/10/2021   Mild intermittent asthma without complication 05/28/2019   Angina pectoris (HCC) 09/09/2018   Family history of early CAD 09/09/2018   Asthma with acute exacerbation 04/21/2017   Hypertensive disorder 01/02/2017   Benign essential HTN 12/16/2016   Dyspnea 12/16/2016   Memory impairment 11/06/2016   Disorder of bladder 08/22/2016   Injury due to exposure to external cause 08/22/2016   History of hematuria 08/22/2016   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/2016   Increased frequency of urination 08/22/2016   History of head injury 07/30/2016   Acute sinusitis 12/27/2015   Mild persistent asthma 12/27/2015   Seasonal and perennial allergic rhinitis 12/27/2015   Gastroesophageal reflux disease 12/27/2015   Cough, persistent 12/27/2015   Seasonal and perennial allergic rhinitis 12/27/2015   Tendinitis of shoulder 08/29/2015   Benign prostatic hyperplasia without urinary obstruction 06/21/2015   History of tuberculosis 06/21/2015   Asthma 06/19/2015   Environmental allergies 06/19/2015   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    AMLODIPINE  (NORVASC ) 5 MG TABLET    TAKE 1 TABLET(5 MG) BY MOUTH DAILY   ATORVASTATIN  (LIPITOR) 10  MG TABLET    TAKE 1 TABLET(10 MG) BY MOUTH DAILY   AZELASTINE  (ASTELIN ) 0.1 % NASAL SPRAY    Place 2 sprays into both nostrils 2 (two) times daily.   CABOTEGRAVIR  ER (APRETUDE ) 600 MG/3ML INJECTION    Inject 3 mLs (600 mg total) into the muscle every 30 (thirty) days.   CYCLOBENZAPRINE  (FLEXERIL ) 10 MG TABLET    Take 1 tablet (10 mg total) by mouth 3 (three) times daily as needed for muscle spasms.   DESCOVY  200-25 MG TABLET    Take 1 tablet by mouth daily.   FAMOTIDINE  (PEPCID ) 20 MG TABLET    Take 1 tablet (20 mg total) by mouth daily.   FLUTICASONE  (FLONASE ) 50 MCG/ACT NASAL SPRAY    Place 1-2 sprays into both nostrils daily as needed.   FLUTICASONE  (FLOVENT  HFA) 220 MCG/ACT INHALER    Inhale 2 puffs into the lungs 2 (two) times daily.   FLUTICASONE  FUROATE (ARNUITY ELLIPTA ) 200 MCG/ACT AEPB    Inhale 1 puff into the lungs daily.   GABAPENTIN  (NEURONTIN ) 300 MG CAPSULE    300mg  po in the morning and at lunch and 600mg  po q hs   IPRATROPIUM (ATROVENT ) 0.03 % NASAL SPRAY    Place 2 sprays into both nostrils 2 (two) times daily.   LEVALBUTEROL  (XOPENEX  HFA) 45 MCG/ACT INHALER    Inhale 2 puffs into the lungs every 4 (four) hours as needed.   LEVOCETIRIZINE (XYZAL ) 5 MG TABLET    TAKE 1 TABLET(5 MG) BY MOUTH EVERY EVENING  METOPROLOL  SUCCINATE (TOPROL -XL) 50 MG 24 HR TABLET    TAKE 1 TABLET(50 MG) BY MOUTH DAILY WITH OR IMMEDIATELY FOLLOWING A MEAL   MUPIROCIN  OINTMENT (BACTROBAN ) 2 %    Apply 1 Application topically 2 (two) times daily.   OMEPRAZOLE  (PRILOSEC) 40 MG CAPSULE    Take 1 capsule (40 mg total) by mouth daily.   PROMETHAZINE -DEXTROMETHORPHAN (PROMETHAZINE -DM) 6.25-15 MG/5ML SYRUP    Take 5 mLs by mouth 4 (four) times daily as needed for cough.   ROPINIROLE  (REQUIP ) 0.5 MG TABLET    Take 0.5 mg by mouth at bedtime.   SENNA (SENOKOT) 8.6 MG TABS TABLET    Take 1 tablet (8.6 mg total) by mouth 2 (two) times daily.   TADALAFIL (CIALIS) 5 MG TABLET    Take 5 mg by mouth daily as needed.     TAMSULOSIN  (FLOMAX ) 0.4 MG CAPS CAPSULE    Take 0.8 mg by mouth daily.  Modified Medications   No medications on file  Discontinued Medications   No medications on file    Allergies: No Known Allergies  Labs: Lab Results  Component Value Date   HIV1RNAQUANT NOT DETECTED 11/05/2023    RPR and STI Lab Results  Component Value Date   LABRPR NON-REACTIVE 11/05/2023    STI Results GC CT  11/05/2023  3:03 PM Negative  Negative     Hepatitis B Lab Results  Component Value Date   HEPBSAB NON-REACTIVE 11/05/2023   HEPBSAG NON-REACTIVE 11/05/2023   HEPBCAB NON-REACTIVE 11/05/2023   Hepatitis C Lab Results  Component Value Date   HEPCAB NON-REACTIVE 11/05/2023   Hepatitis A Lab Results  Component Value Date   HAV REACTIVE (A) 11/05/2023   Lipids: Lab Results  Component Value Date   CHOL 121 09/27/2019   TRIG 57 09/27/2019   HDL 52 09/27/2019   LDLCALC 56 09/27/2019    TARGET DATE: 2nd  Assessment: Travis Bryant presents today for his second Apretude  initiation injection and to follow up for HIV PrEP. No issues with past injections. Denies any symptoms of acute HIV. Last STI screening was on 11/05/2023 and was negative. No known exposures to any STIs since last visit. [name] agrees to STI testing today.    Patient reports symptoms of abdominal pain and pain with an erection. Reports that this pain gets worse with ejaculation. Explained that these symptoms do not seem to correlate with an STI but that we will check regardless. Patient has reported these symptoms to his urologist. Encouraged him to discuss further with his urologist and primary care provider.   Per [employer] guidelines, a rapid HIV test should be drawn prior to Apretude  administration. Due to state shortage of rapid HIV tests, this is temporarily unable to be done. Per decision from [facility] physicians, we will proceed with Apretude  administration at this time without a negative rapid HIV test beforehand. HIV RNA  was collected today and is in process.  Administered cabotegravir  600mg /77mL in left upper outer quadrant of the gluteal muscle. Will see [name] back in 2 months for injection, labs, and HIV PrEP follow up.  Immunizations: he is eligible for his second hepatitis B vaccination, the shingles vaccine and pneumonia vaccine. Agrees to receive his second hepatitis B vaccine today.   Plan: - Apretude  injection administered - Hepatitis B vaccine administered to left deltoid - HIV RNA today - RPR and urine cytology - Next injection, labs, and PrEP follow up appointment scheduled for 02/10/2024 - Call with any issues or questions  Barbra Boone, PharmD PGY1 Acute Care Pharmacy Resident  12/10/2023 2:57 PM

## 2023-12-10 ENCOUNTER — Ambulatory Visit (INDEPENDENT_AMBULATORY_CARE_PROVIDER_SITE_OTHER): Admitting: Pharmacist

## 2023-12-10 ENCOUNTER — Other Ambulatory Visit (HOSPITAL_COMMUNITY): Payer: Self-pay

## 2023-12-10 ENCOUNTER — Ambulatory Visit
Admission: RE | Admit: 2023-12-10 | Discharge: 2023-12-10 | Disposition: A | Discharge status: Home / Self Care | Source: Ambulatory Visit | Attending: Physical Medicine & Rehabilitation | Admitting: Physical Medicine & Rehabilitation

## 2023-12-10 ENCOUNTER — Other Ambulatory Visit: Payer: Self-pay

## 2023-12-10 ENCOUNTER — Other Ambulatory Visit: Payer: Self-pay | Admitting: Physical Medicine & Rehabilitation

## 2023-12-10 ENCOUNTER — Telehealth: Payer: Self-pay

## 2023-12-10 DIAGNOSIS — Z2981 Encounter for HIV pre-exposure prophylaxis: Secondary | ICD-10-CM | POA: Diagnosis not present

## 2023-12-10 DIAGNOSIS — M25551 Pain in right hip: Secondary | ICD-10-CM

## 2023-12-10 DIAGNOSIS — Z113 Encounter for screening for infections with a predominantly sexual mode of transmission: Secondary | ICD-10-CM

## 2023-12-10 DIAGNOSIS — Z23 Encounter for immunization: Secondary | ICD-10-CM | POA: Diagnosis not present

## 2023-12-10 MED ORDER — CABOTEGRAVIR ER 600 MG/3ML IM SUER
600.0000 mg | Freq: Once | INTRAMUSCULAR | Status: AC
  Administered 2023-12-10: 600 mg via INTRAMUSCULAR

## 2023-12-10 NOTE — Telephone Encounter (Signed)
 [facility] Patient Advocate Encounter  Patient's medications APRETUDE  have been couriered to [facility] from [facility] and will be administered at the patients appointment on 12/10/23.  Verline Glow, CPhT Specialty Pharmacy Patient [facility] for [facility] Phone: [phone] Fax:  [phone]

## 2023-12-11 LAB — C. TRACHOMATIS/N. GONORRHOEAE RNA
C. trachomatis RNA, TMA: NOT DETECTED
N. gonorrhoeae RNA, TMA: NOT DETECTED

## 2023-12-12 ENCOUNTER — Encounter (HOSPITAL_COMMUNITY): Payer: Self-pay

## 2023-12-12 LAB — HIV-1 RNA QUANT-NO REFLEX-BLD
HIV 1 RNA Quant: NOT DETECTED {copies}/mL
HIV-1 RNA Quant, Log: NOT DETECTED {Log_copies}/mL

## 2023-12-12 LAB — RPR: RPR Ser Ql: NONREACTIVE

## 2023-12-16 ENCOUNTER — Encounter (HOSPITAL_COMMUNITY)

## 2024-01-16 ENCOUNTER — Ambulatory Visit: Admitting: Podiatry

## 2024-01-16 DIAGNOSIS — B351 Tinea unguium: Secondary | ICD-10-CM

## 2024-01-16 DIAGNOSIS — M79676 Pain in unspecified toe(s): Secondary | ICD-10-CM

## 2024-01-16 MED ORDER — CICLOPIROX 8 % EX SOLN
Freq: Every day | CUTANEOUS | 2 refills | Status: AC
Start: 2024-01-16 — End: ?

## 2024-01-16 NOTE — Patient Instructions (Signed)
 Ciclopirox Topical Solution What is this medication? CICLOPIROX (sye kloe PEER ox) treats fungal infections of the nails. It belongs to a group of medications called antifungals. It will not treat infections caused by bacteria or viruses. This medicine may be used for other purposes; ask your health care provider or pharmacist if you have questions. COMMON BRAND NAME(S): Ciclodan Nail Solution, CNL8, Penlac What should I tell my care team before I take this medication? They need to know if you have any of these conditions: Diabetes (high blood sugar) Immune system problems Organ transplant Receiving steroid inhalers, cream, or lotion Seizures Tingling of the fingers or toes or other nerve disorder An unusual or allergic reaction to ciclopirox, other medications, foods, dyes, or preservatives Pregnant or trying to get pregnant Breast-feeding How should I use this medication? This medication is for external use only. Do not take by mouth. Wash your hands before and after use. If you are treating your hands, only wash your hands before use. Do not get it in your eyes. If you do, rinse your eyes with plenty of cool tap water. Use it as directed on the prescription label at the same time every day. Do not use it more often than directed. Use the medication for the full course as directed by your care team, even if you think you are better. Do not stop using it unless your care team tells you to stop it early. Apply a thin film of the medication to the affected area. Talk to your care team about the use of this medication in children. While it may be prescribed for children as young as 12 years for selected conditions, precautions do apply. Overdosage: If you think you have taken too much of this medicine contact a poison control center or emergency room at once. NOTE: This medicine is only for you. Do not share this medicine with others. What if I miss a dose? If you miss a dose, use it as soon as  you can. If it is almost time for your next dose, use only that dose. Do not use double or extra doses. What may interact with this medication? Interactions are not expected. Do not use any other skin products without telling your care team. This list may not describe all possible interactions. Give your health care provider a list of all the medicines, herbs, non-prescription drugs, or dietary supplements you use. Also tell them if you smoke, drink alcohol, or use illegal drugs. Some items may interact with your medicine. What should I watch for while using this medication? Visit your care team for regular checks on your progress. It may be some time before you see the benefit from this medication. Do not use nail polish or other nail cosmetic products on the treated nails. Removal of the unattached, infected nail by your care team is needed with use of this medication. If you have diabetes or numbness in your fingers or toes, talk to your care team about proper nail care. What side effects may I notice from receiving this medication? Side effects that you should report to your care team as soon as possible: Allergic reactions--skin rash, itching, hives, swelling of the face, lips, tongue, or throat Burning, itching, crusting, or peeling of treated skin Side effects that usually do not require medical attention (report to your care team if they continue or are bothersome): Change in nail shape, thickness, or color Mild skin irritation, redness, or dryness This list may not describe all possible side  effects. Call your doctor for medical advice about side effects. You may report side effects to FDA at 1-800-FDA-1088. Where should I keep my medication? Keep out of the reach of children and pets. Store at room temperature between 20 and 25 degrees C (68 and 77 degrees F). This medication is flammable. Avoid exposure to heat, fire, flame, and smoking. Get rid of medications that are no longer needed  or have expired: Take the medication to a medication take-back program. Check with your pharmacy or law enforcement to find a location. If you cannot return the medication, check the label or package insert to see if the medication should be thrown out in the garbage or flushed down the toilet. If you are not sure, ask your care team. If it is safe to put in the trash, take the medication out of the container. Mix the medication with cat litter, dirt, coffee grounds, or other unwanted substance. Seal the mixture in a bag or container. Put it in the trash. NOTE: This sheet is a summary. It may not cover all possible information. If you have questions about this medicine, talk to your doctor, pharmacist, or health care provider.  2024 Elsevier/Gold Standard (2021-11-26 00:00:00)

## 2024-01-17 NOTE — Progress Notes (Signed)
 Subjective: Chief Complaint  Patient presents with   Nail Problem    RM 14 Patient is here for routine foot care and nail trimming.(Patient is currently taking Meloxicam ).     63 year old male presents outside for the above the concerns.  States his nails are thickened elongated he did not get himself may cause discomfort.   Bilaterally.   He gets tingling, stinging on the left foot. He gets sharp pain going down the left leg.  He does follow with neurosurgery.  Objective: AAO x3, NAD DP/PT pulses palpable bilaterally, CRT less than 3 seconds Toenails are hypertrophic, dystrophic with fungal debris present.  Tenderness nails 1-5 bilaterally.  There is no edema, erythema or signs of infection noted today. No open lesions identified bilaterally today. No pain with calf compression, swelling, warmth, erythema  Assessment: Symptomatic onychomycosis  Plan: -All treatment options discussed with the patient including all alternatives, risks, complications.  -Sharply debrided nails x 10 without any complications or bleeding. -Prescribed Penlac  -Daily foot inspection  Return in about 9 weeks (around 03/19/2024) for nail trim.  Charity Conch DPM

## 2024-01-27 ENCOUNTER — Other Ambulatory Visit: Payer: Self-pay

## 2024-01-29 ENCOUNTER — Other Ambulatory Visit: Payer: Self-pay | Admitting: Pharmacist

## 2024-01-29 ENCOUNTER — Other Ambulatory Visit: Payer: Self-pay

## 2024-01-29 ENCOUNTER — Other Ambulatory Visit (HOSPITAL_COMMUNITY): Payer: Self-pay

## 2024-01-29 DIAGNOSIS — Z2981 Encounter for HIV pre-exposure prophylaxis: Secondary | ICD-10-CM

## 2024-01-29 MED ORDER — APRETUDE 600 MG/3ML IM SUER
600.0000 mg | INTRAMUSCULAR | 5 refills | Status: AC
  Filled 2024-01-29: qty 3, 60d supply, fill #0
  Filled 2024-03-26: qty 3, 60d supply, fill #1
  Filled 2024-06-09: qty 3, 60d supply, fill #2
  Filled 2024-07-21: qty 3, 60d supply, fill #3

## 2024-01-29 NOTE — Progress Notes (Signed)
 Specialty Pharmacy Refill Coordination Note  Travis Bryant is a 63 y.o. male assessed today regarding refills of clinic administered specialty medication(s) Cabotegravir  (Apretude )   Clinic requested Courier to Provider Office   Delivery date: 02/04/24   Verified address: [address] [place] [address]   Medication will be filled on 02/03/24.

## 2024-02-03 ENCOUNTER — Other Ambulatory Visit: Payer: Self-pay

## 2024-02-04 ENCOUNTER — Telehealth: Payer: Self-pay

## 2024-02-04 NOTE — Telephone Encounter (Signed)
 [facility] Patient Advocate Encounter  Patient's medications APRETUDE  have been couriered to [facility] from [facility] and will be administered at the patients appointment on 02/10/24.  Charmaine Sharps, CPhT Specialty Pharmacy Patient [facility] for Infectious Disease Phone: [phone] Fax:  [phone]

## 2024-02-09 NOTE — Progress Notes (Unsigned)
 HPI: Travis Bryant is a 63 y.o. male who presents to the [facility] pharmacy clinic for Apretude  administration and HIV PrEP follow up.  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   Mild hyperlipidemia 09/10/2021   Mild intermittent asthma without complication 05/28/2019   Angina pectoris (HCC) 09/09/2018   Family history of early CAD 09/09/2018   Asthma with acute exacerbation 04/21/2017   Hypertensive disorder 01/02/2017   Benign essential HTN 12/16/2016   Dyspnea 12/16/2016   Memory impairment 11/06/2016   Disorder of bladder 08/22/2016   Injury due to exposure to external cause 08/22/2016   History of hematuria 08/22/2016   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/2016   Increased frequency of urination 08/22/2016   History of head injury 07/30/2016   Acute sinusitis 12/27/2015   Mild persistent asthma 12/27/2015   Seasonal and perennial allergic rhinitis 12/27/2015   Gastroesophageal reflux disease 12/27/2015   Cough, persistent 12/27/2015   Seasonal and perennial allergic rhinitis 12/27/2015   Tendinitis of shoulder 08/29/2015   Benign prostatic hyperplasia without urinary obstruction 06/21/2015   History of tuberculosis 06/21/2015   Asthma 06/19/2015   Environmental allergies 06/19/2015   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    AMLODIPINE  (NORVASC ) 5 MG TABLET    TAKE 1 TABLET(5 MG) BY MOUTH DAILY   ATORVASTATIN  (LIPITOR) 10  MG TABLET    TAKE 1 TABLET(10 MG) BY MOUTH DAILY   AZELASTINE  (ASTELIN ) 0.1 % NASAL SPRAY    Place 2 sprays into both nostrils 2 (two) times daily.   CABOTEGRAVIR  ER (APRETUDE ) 600 MG/3ML INJECTION    Inject 3 mLs (600 mg total) into the muscle every 2 (two) months.   CICLOPIROX  (PENLAC ) 8 % SOLUTION    Apply topically at bedtime. Apply over nail and surrounding skin. Apply daily over previous coat. After seven (7) days, may remove with alcohol and continue cycle.   CYCLOBENZAPRINE  (FLEXERIL ) 10 MG TABLET    Take 1 tablet (10 mg total) by mouth 3 (three) times daily as needed for muscle spasms.   DESCOVY  200-25 MG TABLET    Take 1 tablet by mouth daily.   FAMOTIDINE  (PEPCID ) 20 MG TABLET    Take 1 tablet (20 mg total) by mouth daily.   FLUTICASONE  (FLONASE ) 50 MCG/ACT NASAL SPRAY    Place 1-2 sprays into both nostrils daily as needed.   FLUTICASONE  (FLOVENT  HFA) 220 MCG/ACT INHALER    Inhale 2 puffs into the lungs 2 (two) times daily.   FLUTICASONE  FUROATE (ARNUITY ELLIPTA ) 200 MCG/ACT AEPB    Inhale 1 puff into the lungs daily.   GABAPENTIN  (NEURONTIN ) 300 MG CAPSULE    300mg  po in the morning and at lunch and 600mg  po q hs   IPRATROPIUM (ATROVENT ) 0.03 % NASAL SPRAY    Place 2 sprays into both nostrils 2 (two) times daily.   LEVALBUTEROL  (XOPENEX  HFA) 45 MCG/ACT  INHALER    Inhale 2 puffs into the lungs every 4 (four) hours as needed.   LEVOCETIRIZINE (XYZAL ) 5 MG TABLET    TAKE 1 TABLET(5 MG) BY MOUTH EVERY EVENING   METOPROLOL  SUCCINATE (TOPROL -XL) 50 MG 24 HR TABLET    TAKE 1 TABLET(50 MG) BY MOUTH DAILY WITH OR IMMEDIATELY FOLLOWING A MEAL   MUPIROCIN  OINTMENT (BACTROBAN ) 2 %    Apply 1 Application topically 2 (two) times daily.   OMEPRAZOLE  (PRILOSEC) 40 MG CAPSULE    Take 1 capsule (40 mg total) by mouth daily.   PROMETHAZINE -DEXTROMETHORPHAN (PROMETHAZINE -DM) 6.25-15 MG/5ML SYRUP    Take 5 mLs by mouth 4 (four) times daily as needed for cough.   ROPINIROLE  (REQUIP ) 0.5 MG TABLET    Take 0.5  mg by mouth at bedtime.   SENNA (SENOKOT) 8.6 MG TABS TABLET    Take 1 tablet (8.6 mg total) by mouth 2 (two) times daily.   TADALAFIL (CIALIS) 5 MG TABLET    Take 5 mg by mouth daily as needed.    TAMSULOSIN  (FLOMAX ) 0.4 MG CAPS CAPSULE    Take 0.8 mg by mouth daily.  Modified Medications   No medications on file  Discontinued Medications   No medications on file    Allergies: No Known Allergies  Labs: Lab Results  Component Value Date   HIV1RNAQUANT NOT DETECTED 12/10/2023   HIV1RNAQUANT NOT DETECTED 11/05/2023    RPR and STI Lab Results  Component Value Date   LABRPR NON-REACTIVE 12/10/2023   LABRPR NON-REACTIVE 11/05/2023    STI Results GC CT  11/05/2023  3:03 PM Negative  Negative     Hepatitis B Lab Results  Component Value Date   HEPBSAB NON-REACTIVE 11/05/2023   HEPBSAG NON-REACTIVE 11/05/2023   HEPBCAB NON-REACTIVE 11/05/2023   Hepatitis C Lab Results  Component Value Date   HEPCAB NON-REACTIVE 11/05/2023   Hepatitis A Lab Results  Component Value Date   HAV REACTIVE (A) 11/05/2023   Lipids: Lab Results  Component Value Date   CHOL 121 09/27/2019   TRIG 57 09/27/2019   HDL 52 09/27/2019   LDLCALC 56 09/27/2019    TARGET DATE: The 2nd  Assessment: [name] presents today for his Apretude  injection and to follow up for HIV PrEP. No issues with past injections. Denies any symptoms of acute HIV. Last STI screening was on 12/10/23 and was negative. No known exposures to any STIs since last visit and no new partners so will defer testing today.  He is still experiencing abdominal pain and pain with ejaculation. This has been going on for ~6 months now. Urology has prescribed cipro and doxycycline  with concerns for chronic prostatitis, but he states it has barely helped.  He took cipro for 2 weeks then stopped and returned to urology who put him on doxycycline  x 1 month. 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 I asked him to take both the cipro and doxycycline  together and finish the course. If it does not provide some relief then follow back up with urology. He agrees with plan.     Per [employer] guidelines, a rapid HIV test should be drawn prior to Apretude  administration. Due to state shortage of rapid HIV tests, this is temporarily unable to be done. Per decision from [facility] physicians, we will proceed with Apretude  administration at this time without a negative rapid HIV  test beforehand. HIV RNA was collected today and is in process.  Administered cabotegravir  600mg /41mL in left upper outer quadrant of the gluteal muscle. Will see him back in 2 months for injection, labs, and HIV PrEP follow up.  Plan: - Apretude  injection administered - HIV RNA today - Next injection, labs, and PrEP follow up appointment scheduled for 04/13/24 - Call with any issues or questions  Salahuddin Arismendez L. Brittin Belnap, PharmD, BCIDP, AAHIVP, CPP Clinical Pharmacist Practitioner - Infectious Diseases Clinical Pharmacist Lead - Specialty Pharmacy [facility] for Infectious Disease 02/09/2024, 5:08 PM

## 2024-02-10 ENCOUNTER — Ambulatory Visit: Admitting: Pharmacist

## 2024-02-10 ENCOUNTER — Other Ambulatory Visit: Payer: Self-pay

## 2024-02-10 DIAGNOSIS — Z113 Encounter for screening for infections with a predominantly sexual mode of transmission: Secondary | ICD-10-CM

## 2024-02-10 DIAGNOSIS — Z2981 Encounter for HIV pre-exposure prophylaxis: Secondary | ICD-10-CM | POA: Diagnosis not present

## 2024-02-10 MED ORDER — CABOTEGRAVIR ER 600 MG/3ML IM SUER
600.0000 mg | Freq: Once | INTRAMUSCULAR | Status: AC
  Administered 2024-02-10: 600 mg via INTRAMUSCULAR

## 2024-02-12 LAB — HIV-1 RNA QUANT-NO REFLEX-BLD
HIV 1 RNA Quant: NOT DETECTED {copies}/mL
HIV-1 RNA Quant, Log: NOT DETECTED {Log_copies}/mL

## 2024-02-18 ENCOUNTER — Other Ambulatory Visit: Payer: Self-pay | Admitting: Cardiology

## 2024-02-18 DIAGNOSIS — I10 Essential (primary) hypertension: Secondary | ICD-10-CM

## 2024-02-18 DIAGNOSIS — I209 Angina pectoris, unspecified: Secondary | ICD-10-CM

## 2024-03-18 ENCOUNTER — Other Ambulatory Visit: Payer: Self-pay | Admitting: Cardiology

## 2024-03-18 DIAGNOSIS — I10 Essential (primary) hypertension: Secondary | ICD-10-CM

## 2024-03-19 ENCOUNTER — Ambulatory Visit: Admitting: Podiatry

## 2024-03-22 ENCOUNTER — Encounter: Payer: Self-pay | Admitting: Pharmacist

## 2024-03-26 ENCOUNTER — Other Ambulatory Visit: Payer: Self-pay

## 2024-03-26 ENCOUNTER — Other Ambulatory Visit (HOSPITAL_COMMUNITY): Payer: Self-pay

## 2024-03-26 NOTE — Progress Notes (Signed)
 Specialty Pharmacy Refill Coordination Note  Travis Bryant is a 63 y.o. male assessed today regarding refills of clinic administered specialty medication(s) Cabotegravir  (Apretude )   Clinic requested Courier to Provider Office   Delivery date: 04/05/24   Verified address: [address] [place] [address]   Medication will be filled on 04/02/24.

## 2024-04-02 ENCOUNTER — Other Ambulatory Visit: Payer: Self-pay

## 2024-04-06 ENCOUNTER — Telehealth: Payer: Self-pay

## 2024-04-06 NOTE — Telephone Encounter (Signed)
 [facility] Patient Advocate Encounter  Patient's medications Apretude  have been couriered to [facility] from [facility] and will be administered at the patients appointment on 04/19/24.  Travis Bryant, CPhT Specialty Pharmacy Patient [facility] for Infectious Disease Phone: [phone] Fax:  [phone]

## 2024-04-13 ENCOUNTER — Ambulatory Visit: Admitting: Pharmacist

## 2024-04-18 ENCOUNTER — Encounter: Payer: Self-pay | Admitting: Infectious Disease

## 2024-04-18 DIAGNOSIS — N5312 Painful ejaculation: Secondary | ICD-10-CM | POA: Insufficient documentation

## 2024-04-18 DIAGNOSIS — R109 Unspecified abdominal pain: Secondary | ICD-10-CM | POA: Insufficient documentation

## 2024-04-18 DIAGNOSIS — Z79899 Other long term (current) drug therapy: Secondary | ICD-10-CM | POA: Insufficient documentation

## 2024-04-19 ENCOUNTER — Other Ambulatory Visit (HOSPITAL_COMMUNITY)
Admission: RE | Admit: 2024-04-19 | Discharge: 2024-04-19 | Disposition: A | Discharge status: Home / Self Care | Source: Ambulatory Visit | Attending: Infectious Disease | Admitting: Infectious Disease

## 2024-04-19 ENCOUNTER — Ambulatory Visit: Admitting: Infectious Disease

## 2024-04-19 ENCOUNTER — Other Ambulatory Visit: Payer: Self-pay

## 2024-04-19 VITALS — BP 115/70 | HR 87 | Temp 98.5°F | Wt 151.0 lb

## 2024-04-19 DIAGNOSIS — R109 Unspecified abdominal pain: Secondary | ICD-10-CM | POA: Diagnosis not present

## 2024-04-19 DIAGNOSIS — N5312 Painful ejaculation: Secondary | ICD-10-CM | POA: Insufficient documentation

## 2024-04-19 DIAGNOSIS — Z2981 Encounter for HIV pre-exposure prophylaxis: Secondary | ICD-10-CM | POA: Diagnosis not present

## 2024-04-19 DIAGNOSIS — N529 Male erectile dysfunction, unspecified: Secondary | ICD-10-CM | POA: Diagnosis present

## 2024-04-19 DIAGNOSIS — Z79899 Other long term (current) drug therapy: Secondary | ICD-10-CM

## 2024-04-19 DIAGNOSIS — N401 Enlarged prostate with lower urinary tract symptoms: Secondary | ICD-10-CM | POA: Diagnosis not present

## 2024-04-19 MED ORDER — CABOTEGRAVIR ER 600 MG/3ML IM SUER
600.0000 mg | Freq: Once | INTRAMUSCULAR | Status: AC
  Administered 2024-04-19: 600 mg via INTRAMUSCULAR
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
## 2024-04-20 LAB — URINE CYTOLOGY ANCILLARY ONLY
Chlamydia: NEGATIVE
Comment: NEGATIVE
Comment: NEGATIVE
Comment: NORMAL
Neisseria Gonorrhea: NEGATIVE
Trichomonas: NEGATIVE

## 2024-04-20 LAB — CYTOLOGY, (ORAL, ANAL, URETHRAL) ANCILLARY ONLY
Chlamydia: NEGATIVE
Comment: NEGATIVE
Comment: NORMAL
Neisseria Gonorrhea: NEGATIVE

## 2024-04-20 NOTE — Addendum Note (Signed)
 Addended by: Kristl Morioka M on: 04/20/2024 09:40 AM   Modules accepted: Orders

## 2024-04-21 LAB — PSA: PSA: 2.6 ng/mL (ref <=4.00)

## 2024-04-21 LAB — URINALYSIS, ROUTINE W REFLEX MICROSCOPIC
Bilirubin Urine: NEGATIVE
Glucose, UA: NEGATIVE
Hgb urine dipstick: NEGATIVE
Ketones, ur: NEGATIVE
Leukocytes,Ua: NEGATIVE
Nitrite: NEGATIVE
Protein, ur: NEGATIVE
Specific Gravity, Urine: 1.024 (ref 1.001–1.035)
pH: 5.5 (ref 5.0–8.0)

## 2024-04-21 LAB — RPR: RPR Ser Ql: NONREACTIVE

## 2024-04-21 LAB — URINE CULTURE
MICRO NUMBER:: 16937236
Result:: NO GROWTH
SPECIMEN QUALITY:: ADEQUATE

## 2024-04-21 LAB — HIV-1 RNA QUANT-NO REFLEX-BLD
HIV 1 RNA Quant: NOT DETECTED {copies}/mL
HIV-1 RNA Quant, Log: NOT DETECTED {Log_copies}/mL

## 2024-04-29 ENCOUNTER — Ambulatory Visit: Admitting: Podiatry

## 2024-04-29 ENCOUNTER — Encounter: Payer: Self-pay | Admitting: Podiatry

## 2024-04-29 DIAGNOSIS — M79676 Pain in unspecified toe(s): Secondary | ICD-10-CM

## 2024-04-29 DIAGNOSIS — B351 Tinea unguium: Secondary | ICD-10-CM | POA: Diagnosis not present

## 2024-04-29 NOTE — Progress Notes (Signed)
 This patient presents to the office with chief complaint of long thick painful nails.  Patient says the nails are painful walking and wearing shoes.  This patient is unable to self treat.  This patient is unable to trim hisnails since she is unable to reach his nails.  he presents to the office for preventative foot care services.  General Appearance  Alert, conversant and in no acute stress.  Vascular  Dorsalis pedis and posterior tibial  pulses are palpable  bilaterally.  Capillary return is within normal limits  bilaterally. Temperature is within normal limits  bilaterally.  Neurologic  Senn-Weinstein monofilament wire test within normal limits  bilaterally. Muscle power within normal limits bilaterally.  Nails Thick disfigured discolored nails with subungual debris  from hallux to fifth toes bilaterally. No evidence of bacterial infection or drainage bilaterally.  Orthopedic  No limitations of motion  feet .  No crepitus or effusions noted.  No bony pathology or digital deformities noted.  Skin  normotropic skin with no porokeratosis noted bilaterally.  No signs of infections or ulcers noted.     Onychomycosis  Nails  B/L.  Pain in right toes  Pain in left toes  Debridement of nails both feet followed trimming the nails with dremel tool.    RTC 3 months.   Helane Gunther DPM

## 2024-06-09 ENCOUNTER — Other Ambulatory Visit: Payer: Self-pay

## 2024-06-09 ENCOUNTER — Other Ambulatory Visit (HOSPITAL_COMMUNITY): Payer: Self-pay

## 2024-06-09 NOTE — Progress Notes (Signed)
 Specialty Pharmacy Refill Coordination Note  Travis Bryant is a 63 y.o. male assessed today regarding refills of clinic administered specialty medication(s) Cabotegravir  (Apretude )   Clinic requested Courier to Provider Office   Delivery date: 06/14/24   Verified address: [address] [place] [address]   Medication will be filled on 06/11/24.

## 2024-06-11 ENCOUNTER — Other Ambulatory Visit: Payer: Self-pay

## 2024-06-14 ENCOUNTER — Telehealth: Payer: Self-pay

## 2024-06-14 NOTE — Telephone Encounter (Signed)
 [facility] Patient Advocate Encounter  Patient's medications Apretude  have been couriered to [facility] from [facility] and will be administered at the patients appointment on 06/15/24.  Arland Hutchinson, CPhT Specialty Pharmacy Patient [facility] Phone: [phone] Fax:  [phone]

## 2024-06-14 NOTE — Progress Notes (Unsigned)
 HPI: Travis Bryant is a 63 y.o. male who presents to the [facility] pharmacy clinic for Apretude  administration and HIV PrEP follow up.  Referring ID Provider: Dr. 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   Mild hyperlipidemia 09/10/2021   Mild intermittent asthma without complication 05/28/2019   Angina pectoris 09/09/2018   Family history of early CAD 09/09/2018   Asthma with acute exacerbation 04/21/2017   Hypertensive disorder 01/02/2017   Benign essential HTN 12/16/2016   Dyspnea 12/16/2016   Memory impairment 11/06/2016   Disorder of bladder 08/22/2016   Injury due to exposure to external cause 08/22/2016   History of hematuria 08/22/2016   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/2016   Increased frequency of urination 08/22/2016   History of head injury 07/30/2016   Acute sinusitis 12/27/2015   Mild persistent asthma 12/27/2015   Seasonal and perennial allergic rhinitis 12/27/2015   Gastroesophageal reflux disease 12/27/2015   Cough, persistent 12/27/2015   Seasonal and perennial allergic rhinitis 12/27/2015   Tendinitis of shoulder 08/29/2015   Benign prostatic hyperplasia without urinary obstruction 06/21/2015   History of tuberculosis 06/21/2015   Asthma 06/19/2015   Environmental allergies 06/19/2015   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    AMLODIPINE  (NORVASC ) 5 MG TABLET    TAKE 1 TABLET(5 MG) BY MOUTH DAILY   ATORVASTATIN  (LIPITOR) 10 MG TABLET    TAKE 1 TABLET(10 MG) BY MOUTH DAILY   AZELASTINE  (ASTELIN ) 0.1 % NASAL SPRAY    Place 2 sprays into both nostrils 2 (two) times daily.   CABOTEGRAVIR  ER (APRETUDE ) 600 MG/3ML INJECTION    Inject 3 mLs (600 mg total) into the muscle every 2 (two) months.   CICLOPIROX  (PENLAC ) 8 % SOLUTION    Apply topically at bedtime. Apply over nail and surrounding skin. Apply daily over previous coat. After seven (7) days, may remove with alcohol and continue cycle.   CYCLOBENZAPRINE  (FLEXERIL ) 10 MG TABLET    Take 1 tablet (10 mg total) by mouth 3 (three) times daily as needed for muscle spasms.   DESCOVY  200-25 MG TABLET    Take 1 tablet by mouth daily.   FAMOTIDINE  (PEPCID ) 20 MG TABLET    Take 1 tablet (20 mg total) by mouth daily.   FLUTICASONE  (FLONASE ) 50 MCG/ACT NASAL SPRAY    Place 1-2 sprays into both nostrils daily as needed.   FLUTICASONE  (FLOVENT  HFA) 220 MCG/ACT INHALER    Inhale 2 puffs into the lungs 2 (two) times daily.   FLUTICASONE  FUROATE (ARNUITY ELLIPTA ) 200 MCG/ACT AEPB    Inhale 1 puff into the lungs daily.   GABAPENTIN  (NEURONTIN ) 300 MG CAPSULE    300mg  po in the morning and at lunch and 600mg  po q hs   IPRATROPIUM (ATROVENT ) 0.03 % NASAL SPRAY    Place 2 sprays  into both nostrils 2 (two) times daily.   LEVALBUTEROL  (XOPENEX  HFA) 45 MCG/ACT INHALER    Inhale 2 puffs into the lungs every 4 (four) hours as needed.   LEVOCETIRIZINE (XYZAL ) 5 MG TABLET    TAKE 1 TABLET(5 MG) BY MOUTH EVERY EVENING   METOPROLOL  SUCCINATE (TOPROL -XL) 50 MG 24 HR TABLET    TAKE 1 TABLET(50 MG) BY MOUTH DAILY WITH OR IMMEDIATELY FOLLOWING A MEAL   MUPIROCIN  OINTMENT (BACTROBAN ) 2 %    Apply 1 Application topically 2 (two) times daily.   OMEPRAZOLE  (PRILOSEC) 40 MG CAPSULE    Take 1 capsule (40 mg total) by mouth daily.   PROMETHAZINE -DEXTROMETHORPHAN (PROMETHAZINE -DM) 6.25-15 MG/5ML  SYRUP    Take 5 mLs by mouth 4 (four) times daily as needed for cough.   ROPINIROLE  (REQUIP ) 0.5 MG TABLET    Take 0.5 mg by mouth at bedtime.   SENNA (SENOKOT) 8.6 MG TABS TABLET    Take 1 tablet (8.6 mg total) by mouth 2 (two) times daily.   TADALAFIL (CIALIS) 5 MG TABLET    Take 5 mg by mouth daily as needed.    TAMSULOSIN  (FLOMAX ) 0.4 MG CAPS CAPSULE    Take 0.8 mg by mouth daily.  Modified Medications   No medications on file  Discontinued Medications   No medications on file    Allergies: No Known Allergies  Labs: Lab Results  Component Value Date   HIV1RNAQUANT NOT DETECTED 04/19/2024   HIV1RNAQUANT NOT DETECTED 02/10/2024   HIV1RNAQUANT NOT DETECTED 12/10/2023    RPR and STI Lab Results  Component Value Date   LABRPR NON-REACTIVE 04/19/2024   LABRPR NON-REACTIVE 12/10/2023   LABRPR NON-REACTIVE 11/05/2023    STI Results GC CT  04/19/2024  3:14 PM Negative    Negative  Negative    Negative   11/05/2023  3:03 PM Negative  Negative     Hepatitis B Lab Results  Component Value Date   HEPBSAB NON-REACTIVE 11/05/2023   HEPBSAG NON-REACTIVE 11/05/2023   HEPBCAB NON-REACTIVE 11/05/2023   Hepatitis C Lab Results  Component Value Date   HEPCAB NON-REACTIVE 11/05/2023   Hepatitis A Lab Results  Component Value Date   HAV REACTIVE (A) 11/05/2023   Lipids: Lab Results  Component Value Date   CHOL 121 09/27/2019   TRIG 57 09/27/2019   HDL 52 09/27/2019   LDLCALC 56 09/27/2019    Target Date: The 2nd  Assessment: Travis Bryant presents today for his Apretude  injection and to follow up for HIV PrEP. No issues with past injections. Denies any symptoms of acute HIV. Last HIV RNA was negative on 04/19/24.   Routine labs:  HIV RNA; sexual health screenings; recently completed Hepatitis B vaccine series, will check for immunity today.  Apretude : Administered cabotegravir  600mg /23mL in right upper outer quadrant of the gluteal muscle. Will see him back in 2 months for  next Apretude  injection, labs, and HIV PrEP follow up.  Plan: - Apretude  injection administered - HIV RNA, RPR, urine cytology, and Hepatitis B surface antibody today - Next injection, labs, and PrEP follow up appointment scheduled for 08/17/24 - Call with any issues or questions  Shanaye Rief L. Esra Frankowski, PharmD, BCIDP, AAHIVP, CPP Clinical Pharmacist Practitioner - Infectious Diseases Clinical Pharmacist Lead - Specialty Pharmacy [facility]

## 2024-06-15 ENCOUNTER — Other Ambulatory Visit: Payer: Self-pay

## 2024-06-15 ENCOUNTER — Other Ambulatory Visit (HOSPITAL_COMMUNITY)
Admission: RE | Admit: 2024-06-15 | Discharge: 2024-06-15 | Disposition: A | Discharge status: Home / Self Care | Source: Ambulatory Visit | Attending: Infectious Disease | Admitting: Infectious Disease

## 2024-06-15 ENCOUNTER — Ambulatory Visit (INDEPENDENT_AMBULATORY_CARE_PROVIDER_SITE_OTHER): Admitting: Pharmacist

## 2024-06-15 DIAGNOSIS — Z113 Encounter for screening for infections with a predominantly sexual mode of transmission: Secondary | ICD-10-CM

## 2024-06-15 DIAGNOSIS — Z79899 Other long term (current) drug therapy: Secondary | ICD-10-CM | POA: Diagnosis not present

## 2024-06-15 MED ORDER — CABOTEGRAVIR ER 600 MG/3ML IM SUER
600.0000 mg | Freq: Once | INTRAMUSCULAR | Status: AC
  Administered 2024-06-15: 600 mg via INTRAMUSCULAR

## 2024-06-16 LAB — URINE CYTOLOGY ANCILLARY ONLY
Chlamydia: NEGATIVE
Comment: NEGATIVE
Comment: NORMAL
Neisseria Gonorrhea: NEGATIVE

## 2024-06-18 LAB — HIV-1 RNA QUANT-NO REFLEX-BLD
HIV 1 RNA Quant: NOT DETECTED {copies}/mL
HIV-1 RNA Quant, Log: NOT DETECTED {Log_copies}/mL

## 2024-06-18 LAB — RPR: RPR Ser Ql: NONREACTIVE

## 2024-06-18 LAB — HEPATITIS B SURFACE ANTIBODY,QUALITATIVE: Hep B S Ab: REACTIVE — AB

## 2024-07-21 ENCOUNTER — Other Ambulatory Visit: Payer: Self-pay

## 2024-07-21 ENCOUNTER — Other Ambulatory Visit (HOSPITAL_COMMUNITY): Payer: Self-pay

## 2024-07-21 NOTE — Progress Notes (Signed)
 Specialty Pharmacy Refill Coordination Note  Travis Bryant is a 63 y.o. male assessed today regarding refills of clinic administered specialty medication(s) Cabotegravir  (Apretude )   Clinic requested Courier to Provider Office   Delivery date: 08/10/24   Verified address: [address] [place] [address]   Medication will be filled on 08/09/24.

## 2024-07-29 ENCOUNTER — Ambulatory Visit: Admitting: Podiatry

## 2024-08-09 ENCOUNTER — Other Ambulatory Visit: Payer: Self-pay

## 2024-08-10 ENCOUNTER — Telehealth: Payer: Self-pay

## 2024-08-10 NOTE — Telephone Encounter (Signed)
 [facility] Patient Advocate Encounter  Patient's medications APRETUDE  have been couriered to [facility] from [facility] and will be administered at the patients appointment on 08/17/24.  Charmaine Sharps, CPhT Specialty Pharmacy Patient [facility] Phone: [phone] Fax:  [phone]

## 2024-08-16 NOTE — Progress Notes (Unsigned)
 "  HPI: Travis Bryant is a 64 y.o. male who presents to the [facility] pharmacy clinic for Apretude  administration and HIV PrEP follow up.  Referring ID Provider: Dr. 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   Mild hyperlipidemia 09/10/2021   Mild intermittent asthma without complication 05/28/2019   Angina pectoris 09/09/2018   Family history of early CAD 09/09/2018   Asthma with acute exacerbation 04/21/2017   Hypertensive disorder 01/02/2017   Benign essential HTN 12/16/2016   Dyspnea 12/16/2016   Memory impairment 11/06/2016   Disorder of bladder 08/22/2016   Injury due to exposure to external cause 08/22/2016   History of hematuria 08/22/2016   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/2016   Increased frequency of urination 08/22/2016   History of head injury 07/30/2016   Acute sinusitis 12/27/2015   Mild persistent asthma 12/27/2015   Seasonal and perennial allergic rhinitis 12/27/2015   Gastroesophageal reflux disease 12/27/2015   Cough, persistent 12/27/2015   Seasonal and perennial allergic rhinitis 12/27/2015   Tendinitis of shoulder 08/29/2015   Benign prostatic hyperplasia without urinary obstruction 06/21/2015   History of tuberculosis 06/21/2015   Asthma 06/19/2015   Environmental allergies 06/19/2015   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    AMLODIPINE  (NORVASC ) 5 MG TABLET    TAKE 1 TABLET(5 MG) BY MOUTH DAILY   ATORVASTATIN  (LIPITOR) 10 MG TABLET    TAKE 1 TABLET(10 MG) BY MOUTH DAILY   AZELASTINE  (ASTELIN ) 0.1 % NASAL SPRAY    Place 2 sprays into both nostrils 2 (two) times daily.   CABOTEGRAVIR  ER (APRETUDE ) 600 MG/3ML INJECTION    Inject 3 mLs (600 mg total) into the muscle every 2 (two) months.   CICLOPIROX  (PENLAC ) 8 % SOLUTION    Apply topically at bedtime. Apply over nail and surrounding skin. Apply daily over previous coat. After seven (7) days, may remove with alcohol and continue cycle.   CYCLOBENZAPRINE  (FLEXERIL ) 10 MG TABLET    Take 1 tablet (10 mg total) by mouth 3 (three) times daily as needed for muscle spasms.   DESCOVY  200-25 MG TABLET    Take 1 tablet by mouth daily.   FAMOTIDINE  (PEPCID ) 20 MG TABLET    Take 1 tablet (20 mg total) by mouth daily.   FLUTICASONE  (FLONASE ) 50 MCG/ACT NASAL SPRAY    Place 1-2 sprays into both nostrils daily as needed.   FLUTICASONE  (FLOVENT  HFA) 220 MCG/ACT INHALER    Inhale 2 puffs into the lungs 2 (two) times daily.   FLUTICASONE  FUROATE (ARNUITY ELLIPTA ) 200 MCG/ACT AEPB    Inhale 1 puff into the lungs daily.   GABAPENTIN  (NEURONTIN ) 300 MG CAPSULE    300mg  po in the morning and at lunch and 600mg  po q hs   IPRATROPIUM (ATROVENT ) 0.03 % NASAL SPRAY    Place  2 sprays into both nostrils 2 (two) times daily.   LEVALBUTEROL  (XOPENEX  HFA) 45 MCG/ACT INHALER    Inhale 2 puffs into the lungs every 4 (four) hours as needed.   LEVOCETIRIZINE (XYZAL ) 5 MG TABLET    TAKE 1 TABLET(5 MG) BY MOUTH EVERY EVENING   METOPROLOL  SUCCINATE (TOPROL -XL) 50 MG 24 HR TABLET    TAKE 1 TABLET(50 MG) BY MOUTH DAILY WITH OR IMMEDIATELY FOLLOWING A MEAL   MUPIROCIN  OINTMENT (BACTROBAN ) 2 %    Apply 1 Application topically 2 (two) times daily.   OMEPRAZOLE  (PRILOSEC) 40 MG CAPSULE    Take 1 capsule (40 mg total) by mouth daily.   PROMETHAZINE -DEXTROMETHORPHAN (PROMETHAZINE -DM) 6.25-15 MG/5ML  SYRUP    Take 5 mLs by mouth 4 (four) times daily as needed for cough.   ROPINIROLE  (REQUIP ) 0.5 MG TABLET    Take 0.5 mg by mouth at bedtime.   SENNA (SENOKOT) 8.6 MG TABS TABLET    Take 1 tablet (8.6 mg total) by mouth 2 (two) times daily.   TADALAFIL (CIALIS) 5 MG TABLET    Take 5 mg by mouth daily as needed.    TAMSULOSIN  (FLOMAX ) 0.4 MG CAPS CAPSULE    Take 0.8 mg by mouth daily.  Modified Medications   No medications on file  Discontinued Medications   No medications on file    Allergies: Allergies[1]  Labs: Lab Results  Component Value Date   HIV1RNAQUANT NOT DETECTED 06/15/2024   HIV1RNAQUANT NOT DETECTED 04/19/2024   HIV1RNAQUANT NOT DETECTED 02/10/2024    RPR and STI Lab Results  Component Value Date   LABRPR NON-REACTIVE 06/15/2024   LABRPR NON-REACTIVE 04/19/2024   LABRPR NON-REACTIVE 12/10/2023   LABRPR NON-REACTIVE 11/05/2023    STI Results GC CT  06/15/2024  3:13 PM Negative  Negative   04/19/2024  3:14 PM Negative    Negative  Negative    Negative   11/05/2023  3:03 PM Negative  Negative     Hepatitis B Lab Results  Component Value Date   HEPBSAB REACTIVE (A) 06/15/2024   HEPBSAG NON-REACTIVE 11/05/2023   HEPBCAB NON-REACTIVE 11/05/2023   Hepatitis C Lab Results  Component Value Date   HEPCAB NON-REACTIVE 11/05/2023   Hepatitis A Lab Results  Component Value Date   HAV REACTIVE (A) 11/05/2023   Lipids: Lab Results  Component Value Date   CHOL 121 09/27/2019   TRIG 57 09/27/2019   HDL 52 09/27/2019   LDLCALC 56 09/27/2019    Target Date: 2nd  Assessment: Travis Bryant presents today for his Apretude  injection and to follow up for HIV PrEP. No issues with past injections. Denies any symptoms of acute HIV. Last HIV RNA was negative on 06/15/2024. Mentioned Travis Bryant but patient is not interested at this time. Additionally, patient is requesting a prescription for DoxyPEP and was counseled to take 2 tablets (200 mg total) of doxycycline  by mouth as  soon as possible after condomless sex but no later than 72 hours after condomless sex.   Routine labs:  HIV RNA, urine cytology for GC/Chlamydia, RPR   Eligible vaccinations: Influenza, Covid, PCV20, Shingrix  1/2 - Patient accepts PCV20 and Shingrix  1/2 vaccines today  - Reports states receiving Influenza and Covid vaccines through [facility] about 4 weeks ago  Apretude : Administered cabotegravir  600mg /55mL in left upper outer quadrant of the gluteal muscle. Will see [place] back in 2 months for next Apretude  injection, labs, and HIV PrEP follow up.  Plan: - Apretude  injection administered - Administered PCV20 vaccine into right  deltoid - Administered Shingrix  1/2 vaccine into left deltoid (second dose due around 10/15/2024)  - HIV RNA, urine cytology for GC/Chlamydia, RPR   - DoxyPEP prescription sent to preferred pharmacy  - Next injection, labs, and PrEP follow up appointment scheduled for 10/05/2024 and 12/07/2024 with Cassie - Call with any issues or questions  Feliciano Close, PharmD PGY2 Infectious Diseases Pharmacy Resident      [1] No Known Allergies  "

## 2024-08-17 ENCOUNTER — Other Ambulatory Visit: Payer: Self-pay

## 2024-08-17 ENCOUNTER — Ambulatory Visit (INDEPENDENT_AMBULATORY_CARE_PROVIDER_SITE_OTHER): Admitting: Pharmacist

## 2024-08-17 ENCOUNTER — Other Ambulatory Visit (HOSPITAL_COMMUNITY)
Admission: RE | Admit: 2024-08-17 | Discharge: 2024-08-17 | Disposition: A | Discharge status: Home / Self Care | Source: Ambulatory Visit | Attending: Infectious Disease | Admitting: Infectious Disease

## 2024-08-17 DIAGNOSIS — Z79899 Other long term (current) drug therapy: Secondary | ICD-10-CM | POA: Diagnosis not present

## 2024-08-17 DIAGNOSIS — Z113 Encounter for screening for infections with a predominantly sexual mode of transmission: Secondary | ICD-10-CM

## 2024-08-17 DIAGNOSIS — Z114 Encounter for screening for human immunodeficiency virus [HIV]: Secondary | ICD-10-CM

## 2024-08-17 DIAGNOSIS — Z202 Contact with and (suspected) exposure to infections with a predominantly sexual mode of transmission: Secondary | ICD-10-CM | POA: Diagnosis not present

## 2024-08-17 DIAGNOSIS — Z23 Encounter for immunization: Secondary | ICD-10-CM

## 2024-08-17 MED ORDER — CABOTEGRAVIR ER 600 MG/3ML IM SUER
600.0000 mg | Freq: Once | INTRAMUSCULAR | Status: AC
  Administered 2024-08-17: 600 mg via INTRAMUSCULAR

## 2024-08-17 MED ORDER — DOXYCYCLINE HYCLATE 100 MG PO TABS: ORAL_TABLET | ORAL | 1 refills | Status: AC

## 2024-08-18 LAB — URINE CYTOLOGY ANCILLARY ONLY
Chlamydia: NEGATIVE
Comment: NEGATIVE
Comment: NORMAL
Neisseria Gonorrhea: NEGATIVE

## 2024-08-18 NOTE — Progress Notes (Signed)
 SABRA

## 2024-08-19 LAB — TEST AUTHORIZATION: TEST NAME:: 40085

## 2024-08-19 LAB — HIV-1 RNA QUANT-NO REFLEX-BLD
HIV 1 RNA Quant: NOT DETECTED {copies}/mL
HIV-1 RNA Quant, Log: NOT DETECTED {Log_copies}/mL

## 2024-08-19 LAB — SYPHILIS: RPR W/REFLEX TO RPR TITER AND TREPONEMAL ANTIBODIES, TRADITIONAL SCREENING AND DIAGNOSIS ALGORITHM: RPR Ser Ql: NONREACTIVE

## 2024-08-30 ENCOUNTER — Encounter: Payer: Self-pay | Admitting: Podiatry

## 2024-08-30 ENCOUNTER — Ambulatory Visit: Admitting: Podiatry

## 2024-08-30 DIAGNOSIS — B351 Tinea unguium: Secondary | ICD-10-CM | POA: Diagnosis not present

## 2024-08-30 DIAGNOSIS — M79676 Pain in unspecified toe(s): Secondary | ICD-10-CM

## 2024-08-30 NOTE — Progress Notes (Signed)
 This patient presents to the office with chief complaint of long thick painful nails.  Patient says the nails are painful walking and wearing shoes.  This patient is unable to self treat.  This patient is unable to trim his nails since she is unable to reach his nails.  he presents to the office for preventative foot care services.  General Appearance  Alert, conversant and in no acute stress.  Vascular  Dorsalis pedis and posterior tibial  pulses are palpable  bilaterally.  Capillary return is within normal limits  bilaterally. Temperature is within normal limits  bilaterally.  Neurologic  Senn-Weinstein monofilament wire test within normal limits  bilaterally. Muscle power within normal limits bilaterally.  Nails Thick disfigured discolored nails with subungual debris  from hallux to fifth toes bilaterally. No evidence of bacterial infection or drainage bilaterally.  Orthopedic  No limitations of motion  feet .  No crepitus or effusions noted.  No bony pathology or digital deformities noted.  Skin  normotropic skin with no porokeratosis noted bilaterally.  No signs of infections or ulcers noted.     Onychomycosis  Nails  B/L.  Pain in right toes  Pain in left toes  Debridement of nails both feet followed trimming the nails with dremel tool.    RTC 3 months    Cordella Bold [facility]  [place]

## 2024-09-07 ENCOUNTER — Other Ambulatory Visit: Payer: Self-pay | Admitting: Student

## 2024-09-07 DIAGNOSIS — R109 Unspecified abdominal pain: Secondary | ICD-10-CM

## 2024-09-14 ENCOUNTER — Other Ambulatory Visit

## 2024-09-17 ENCOUNTER — Inpatient Hospital Stay: Admission: RE | Admit: 2024-09-17 | Source: Ambulatory Visit

## 2024-09-17 DIAGNOSIS — R109 Unspecified abdominal pain: Secondary | ICD-10-CM

## 2024-09-17 MED ORDER — IOPAMIDOL (ISOVUE-300) INJECTION 61%
100.0000 mL | Freq: Once | INTRAVENOUS | Status: AC | PRN
  Administered 2024-09-17: 100 mL via INTRAVENOUS

## 2024-10-05 ENCOUNTER — Ambulatory Visit: Payer: Self-pay | Admitting: Pharmacist

## 2024-12-07 ENCOUNTER — Ambulatory Visit: Payer: Self-pay | Admitting: Pharmacist
